# Patient Record
Sex: Male | Born: 1945 | Race: White | Hispanic: No | Marital: Married | Smoking: Former smoker
Health system: Southern US, Community
[De-identification: ages and names within clinical notes are randomized; demographics above are authoritative.]

## PROBLEM LIST (undated history)

## (undated) ENCOUNTER — Ambulatory Visit

## (undated) DIAGNOSIS — T7840XA Allergy, unspecified, initial encounter: Secondary | ICD-10-CM

## (undated) DIAGNOSIS — C9110 Chronic lymphocytic leukemia of B-cell type not having achieved remission: Secondary | ICD-10-CM

## (undated) DIAGNOSIS — N400 Enlarged prostate without lower urinary tract symptoms: Secondary | ICD-10-CM

## (undated) DIAGNOSIS — H269 Unspecified cataract: Secondary | ICD-10-CM

## (undated) HISTORY — DX: Benign prostatic hyperplasia without lower urinary tract symptoms: N40.0

## (undated) HISTORY — DX: Allergy, unspecified, initial encounter: T78.40XA

## (undated) HISTORY — PX: COLONOSCOPY: SHX5424

## (undated) HISTORY — DX: Chronic lymphocytic leukemia of B-cell type not having achieved remission: C91.10

---

## 1994-01-26 HISTORY — PX: HERNIA REPAIR: SHX51

## 2003-01-27 DIAGNOSIS — C9110 Chronic lymphocytic leukemia of B-cell type not having achieved remission: Secondary | ICD-10-CM

## 2003-01-27 HISTORY — DX: Chronic lymphocytic leukemia of B-cell type not having achieved remission: C91.10

## 2003-04-18 ENCOUNTER — Other Ambulatory Visit: Admission: RE | Admit: 2003-04-18 | Discharge: 2003-04-18 | Payer: Self-pay | Admitting: Oncology

## 2003-09-05 ENCOUNTER — Other Ambulatory Visit: Admission: RE | Admit: 2003-09-05 | Discharge: 2003-09-05 | Payer: Self-pay | Admitting: Oncology

## 2004-02-26 ENCOUNTER — Ambulatory Visit: Payer: Self-pay | Admitting: Oncology

## 2004-08-26 ENCOUNTER — Ambulatory Visit: Payer: Self-pay | Admitting: Oncology

## 2005-02-26 ENCOUNTER — Ambulatory Visit: Payer: Self-pay | Admitting: Oncology

## 2005-08-26 ENCOUNTER — Ambulatory Visit: Payer: Self-pay | Admitting: Oncology

## 2005-08-28 LAB — CBC WITH DIFFERENTIAL/PLATELET
EOS%: 0.4 % (ref 0.0–7.0)
Eosinophils Absolute: 0.2 10*3/uL (ref 0.0–0.5)
LYMPH%: 84 % — ABNORMAL HIGH (ref 14.0–48.0)
MCH: 29.2 pg (ref 28.0–33.4)
MCV: 88.7 fL (ref 81.6–98.0)
MONO%: 4.1 % (ref 0.0–13.0)
NEUT#: 4.4 10*3/uL (ref 1.5–6.5)
Platelets: 221 10*3/uL (ref 145–400)
RBC: 4.49 10*6/uL (ref 4.20–5.71)

## 2005-08-28 LAB — ERYTHROCYTE SEDIMENTATION RATE: Sed Rate: 6 mm/hr (ref 0–20)

## 2005-08-28 LAB — MORPHOLOGY

## 2005-08-28 LAB — CHCC SMEAR

## 2005-08-31 LAB — IMMUNOFIXATION ELECTROPHORESIS
IgA: 84 mg/dL (ref 68–378)
IgG (Immunoglobin G), Serum: 616 mg/dL — ABNORMAL LOW (ref 694–1618)

## 2005-08-31 LAB — COMPREHENSIVE METABOLIC PANEL
Albumin: 4.3 g/dL (ref 3.5–5.2)
BUN: 13 mg/dL (ref 6–23)
Calcium: 9.3 mg/dL (ref 8.4–10.5)
Chloride: 103 mEq/L (ref 96–112)
Glucose, Bld: 122 mg/dL — ABNORMAL HIGH (ref 70–99)
Potassium: 4 mEq/L (ref 3.5–5.3)

## 2006-02-23 ENCOUNTER — Ambulatory Visit: Payer: Self-pay | Admitting: Oncology

## 2006-02-26 LAB — CBC & DIFF AND RETIC
Basophils Absolute: 0.7 10*3/uL — ABNORMAL HIGH (ref 0.0–0.1)
EOS%: 0.2 % (ref 0.0–7.0)
HGB: 14.5 g/dL (ref 13.0–17.1)
MCH: 29.7 pg (ref 28.0–33.4)
MCHC: 33.8 g/dL (ref 32.0–35.9)
MCV: 88 fL (ref 81.6–98.0)
MONO%: 4.3 % (ref 0.0–13.0)
NEUT%: 11.7 % — ABNORMAL LOW (ref 40.0–75.0)
RDW: 14.3 % (ref 11.2–14.6)
RETIC #: 34.1 10*3/uL (ref 31.8–103.9)

## 2006-03-03 LAB — COMPREHENSIVE METABOLIC PANEL
Albumin: 4.7 g/dL (ref 3.5–5.2)
BUN: 12 mg/dL (ref 6–23)
Calcium: 9 mg/dL (ref 8.4–10.5)
Chloride: 103 mEq/L (ref 96–112)
Glucose, Bld: 97 mg/dL (ref 70–99)
Potassium: 3.7 mEq/L (ref 3.5–5.3)

## 2006-03-03 LAB — IMMUNOFIXATION ELECTROPHORESIS
IgA: 88 mg/dL (ref 68–378)
IgG (Immunoglobin G), Serum: 743 mg/dL (ref 694–1618)

## 2006-09-03 ENCOUNTER — Ambulatory Visit: Payer: Self-pay | Admitting: Oncology

## 2006-09-07 LAB — MORPHOLOGY: PLT EST: ADEQUATE

## 2006-09-07 LAB — CBC WITH DIFFERENTIAL/PLATELET
Basophils Absolute: 1 10*3/uL — ABNORMAL HIGH (ref 0.0–0.1)
EOS%: 0.4 % (ref 0.0–7.0)
HCT: 39.6 % (ref 38.7–49.9)
HGB: 13.6 g/dL (ref 13.0–17.1)
MCH: 30.2 pg (ref 28.0–33.4)
MCV: 87.5 fL (ref 81.6–98.0)
MONO%: 4 % (ref 0.0–13.0)
NEUT%: 9.6 % — ABNORMAL LOW (ref 40.0–75.0)

## 2006-09-09 LAB — COMPREHENSIVE METABOLIC PANEL
AST: 15 U/L (ref 0–37)
Albumin: 4.5 g/dL (ref 3.5–5.2)
Alkaline Phosphatase: 50 U/L (ref 39–117)
BUN: 12 mg/dL (ref 6–23)
Potassium: 4.2 mEq/L (ref 3.5–5.3)
Sodium: 141 mEq/L (ref 135–145)
Total Bilirubin: 0.7 mg/dL (ref 0.3–1.2)

## 2006-09-09 LAB — PROTEIN ELECTROPHORESIS, SERUM: Gamma Globulin: 9.4 % — ABNORMAL LOW (ref 11.1–18.8)

## 2006-09-09 LAB — BETA 2 MICROGLOBULIN, SERUM: Beta-2 Microglobulin: 1.29 mg/L (ref 1.01–1.73)

## 2007-03-03 ENCOUNTER — Ambulatory Visit: Payer: Self-pay | Admitting: Oncology

## 2007-03-07 LAB — MANUAL DIFFERENTIAL
Basophil: 0 % (ref 0–2)
LYMPH: 92 % — ABNORMAL HIGH (ref 14–49)
MONO: 0 % (ref 0–14)
Metamyelocytes: 0 % (ref 0–0)
Myelocytes: 0 % (ref 0–0)
PLT EST: ADEQUATE

## 2007-03-07 LAB — CBC WITH DIFFERENTIAL/PLATELET
HCT: 41 % (ref 38.7–49.9)
HGB: 13.6 g/dL (ref 13.0–17.1)
MCH: 29.1 pg (ref 28.0–33.4)
MCV: 87.5 fL (ref 81.6–98.0)
Platelets: 214 10*3/uL (ref 145–400)

## 2007-03-09 LAB — PROTEIN ELECTROPHORESIS, SERUM
Alpha-2-Globulin: 10.4 % (ref 7.1–11.8)
Beta 2: 4.4 % (ref 3.2–6.5)
Beta Globulin: 6.2 % (ref 4.7–7.2)
Gamma Globulin: 9.1 % — ABNORMAL LOW (ref 11.1–18.8)
Total Protein, Serum Electrophoresis: 6.9 g/dL (ref 6.0–8.3)

## 2007-03-09 LAB — COMPREHENSIVE METABOLIC PANEL
ALT: 20 U/L (ref 0–53)
AST: 15 U/L (ref 0–37)
Alkaline Phosphatase: 61 U/L (ref 39–117)
Potassium: 3.7 mEq/L (ref 3.5–5.3)
Sodium: 143 mEq/L (ref 135–145)
Total Bilirubin: 0.5 mg/dL (ref 0.3–1.2)
Total Protein: 6.9 g/dL (ref 6.0–8.3)

## 2007-11-08 ENCOUNTER — Ambulatory Visit: Payer: Self-pay | Admitting: Oncology

## 2007-11-10 LAB — CBC & DIFF AND RETIC
BASO%: 0.6 % (ref 0.0–2.0)
Basophils Absolute: 0.3 10*3/uL — ABNORMAL HIGH (ref 0.0–0.1)
HCT: 41 % (ref 38.7–49.9)
HGB: 13.6 g/dL (ref 13.0–17.1)
IRF: 0.28 (ref 0.070–0.380)
MONO#: 0.1 10*3/uL (ref 0.1–0.9)
NEUT%: 23 % — ABNORMAL LOW (ref 40.0–75.0)
WBC: 49.7 10*3/uL — ABNORMAL HIGH (ref 4.0–10.0)
lymph#: 37.7 10*3/uL — ABNORMAL HIGH (ref 0.9–3.3)

## 2007-11-10 LAB — MORPHOLOGY: RBC Comments: NORMAL

## 2007-11-10 LAB — ERYTHROCYTE SEDIMENTATION RATE: Sed Rate: 2 mm/hr (ref 0–20)

## 2007-11-14 LAB — SPEP & IFE WITH QIG
Albumin ELP: 66.5 % — ABNORMAL HIGH (ref 55.8–66.1)
Alpha-2-Globulin: 10 % (ref 7.1–11.8)
Beta 2: 4.1 % (ref 3.2–6.5)
Beta Globulin: 6.5 % (ref 4.7–7.2)
Total Protein, Serum Electrophoresis: 6.7 g/dL (ref 6.0–8.3)

## 2007-11-14 LAB — COMPREHENSIVE METABOLIC PANEL
AST: 13 U/L (ref 0–37)
Albumin: 4.6 g/dL (ref 3.5–5.2)
Alkaline Phosphatase: 55 U/L (ref 39–117)
Calcium: 9.3 mg/dL (ref 8.4–10.5)
Chloride: 105 mEq/L (ref 96–112)
Glucose, Bld: 94 mg/dL (ref 70–99)
Potassium: 4.3 mEq/L (ref 3.5–5.3)
Sodium: 143 mEq/L (ref 135–145)
Total Protein: 6.7 g/dL (ref 6.0–8.3)

## 2008-05-15 ENCOUNTER — Ambulatory Visit: Payer: Self-pay | Admitting: Oncology

## 2008-05-17 LAB — MORPHOLOGY: PLT EST: ADEQUATE

## 2008-05-17 LAB — CBC WITH DIFFERENTIAL/PLATELET
Basophils Absolute: 0.2 10*3/uL — ABNORMAL HIGH (ref 0.0–0.1)
EOS%: 0.5 % (ref 0.0–7.0)
Eosinophils Absolute: 0.2 10*3/uL (ref 0.0–0.5)
HGB: 13.8 g/dL (ref 13.0–17.1)
LYMPH%: 83.4 % — ABNORMAL HIGH (ref 14.0–49.0)
MCH: 29.4 pg (ref 27.2–33.4)
MCV: 88.2 fL (ref 79.3–98.0)
MONO%: 1.9 % (ref 0.0–14.0)
Platelets: 176 10*3/uL (ref 140–400)
RBC: 4.68 10*6/uL (ref 4.20–5.82)
RDW: 14.3 % (ref 11.0–14.6)

## 2008-05-17 LAB — CHCC SMEAR

## 2008-11-07 ENCOUNTER — Ambulatory Visit: Payer: Self-pay | Admitting: Oncology

## 2008-11-09 LAB — CBC & DIFF AND RETIC
BASO%: 0.2 % (ref 0.0–2.0)
EOS%: 0.3 % (ref 0.0–7.0)
HCT: 42.5 % (ref 38.4–49.9)
LYMPH%: 89.2 % — ABNORMAL HIGH (ref 14.0–49.0)
MCH: 28.9 pg (ref 27.2–33.4)
MCHC: 32 g/dL (ref 32.0–36.0)
MCV: 90.4 fL (ref 79.3–98.0)
MONO%: 1.9 % (ref 0.0–14.0)
NEUT%: 8.4 % — ABNORMAL LOW (ref 39.0–75.0)
Platelets: 195 10*3/uL (ref 140–400)

## 2008-11-09 LAB — MORPHOLOGY

## 2008-11-09 LAB — CHCC SMEAR

## 2008-11-14 LAB — COMPREHENSIVE METABOLIC PANEL
CO2: 27 mEq/L (ref 19–32)
Calcium: 9 mg/dL (ref 8.4–10.5)
Chloride: 105 mEq/L (ref 96–112)
Creatinine, Ser: 1.11 mg/dL (ref 0.40–1.50)
Glucose, Bld: 91 mg/dL (ref 70–99)
Total Bilirubin: 0.4 mg/dL (ref 0.3–1.2)

## 2008-11-14 LAB — IMMUNOFIXATION ELECTROPHORESIS
IgA: 71 mg/dL (ref 68–378)
IgM, Serum: 33 mg/dL — ABNORMAL LOW (ref 60–263)
Total Protein, Serum Electrophoresis: 6.3 g/dL (ref 6.0–8.3)

## 2008-11-14 LAB — LACTATE DEHYDROGENASE: LDH: 131 U/L (ref 94–250)

## 2009-11-12 ENCOUNTER — Ambulatory Visit: Payer: Self-pay | Admitting: Oncology

## 2009-11-14 LAB — COMPREHENSIVE METABOLIC PANEL
Albumin: 4.5 g/dL (ref 3.5–5.2)
CO2: 26 mEq/L (ref 19–32)
Calcium: 9.1 mg/dL (ref 8.4–10.5)
Chloride: 105 mEq/L (ref 96–112)
Creatinine, Ser: 1.2 mg/dL (ref 0.40–1.50)
Glucose, Bld: 121 mg/dL — ABNORMAL HIGH (ref 70–99)
Total Bilirubin: 0.5 mg/dL (ref 0.3–1.2)
Total Protein: 6.5 g/dL (ref 6.0–8.3)

## 2009-11-14 LAB — CBC & DIFF AND RETIC
Basophils Absolute: 0.1 10*3/uL (ref 0.0–0.1)
EOS%: 0.3 % (ref 0.0–7.0)
Eosinophils Absolute: 0.2 10*3/uL (ref 0.0–0.5)
HGB: 13.6 g/dL (ref 13.0–17.1)
LYMPH%: 88 % — ABNORMAL HIGH (ref 14.0–49.0)
MCH: 29.2 pg (ref 27.2–33.4)
MCHC: 32.6 g/dL (ref 32.0–36.0)
MCV: 89.7 fL (ref 79.3–98.0)
NEUT%: 9.6 % — ABNORMAL LOW (ref 39.0–75.0)
Platelets: 177 10*3/uL (ref 140–400)
RBC: 4.65 10*6/uL (ref 4.20–5.82)
RDW: 14.1 % (ref 11.0–14.6)
WBC: 46.6 10*3/uL — ABNORMAL HIGH (ref 4.0–10.3)
lymph#: 41 10*3/uL — ABNORMAL HIGH (ref 0.9–3.3)

## 2010-02-16 ENCOUNTER — Encounter: Payer: Self-pay | Admitting: Orthopedic Surgery

## 2010-09-25 ENCOUNTER — Other Ambulatory Visit: Payer: Self-pay | Admitting: Surgery

## 2010-11-14 ENCOUNTER — Other Ambulatory Visit: Payer: Self-pay | Admitting: Oncology

## 2010-11-14 ENCOUNTER — Encounter (HOSPITAL_BASED_OUTPATIENT_CLINIC_OR_DEPARTMENT_OTHER): Payer: Medicare Other | Admitting: Oncology

## 2010-11-14 DIAGNOSIS — C9110 Chronic lymphocytic leukemia of B-cell type not having achieved remission: Secondary | ICD-10-CM

## 2010-11-14 LAB — CBC WITH DIFFERENTIAL/PLATELET
BASO%: 1.3 % (ref 0.0–2.0)
MCHC: 32.4 g/dL (ref 32.0–36.0)
MONO#: 0.6 10*3/uL (ref 0.1–0.9)
RBC: 4.71 10*6/uL (ref 4.20–5.82)
WBC: 47.4 10*3/uL — ABNORMAL HIGH (ref 4.0–10.3)
lymph#: 40.4 10*3/uL — ABNORMAL HIGH (ref 0.9–3.3)

## 2010-11-14 LAB — CHCC SMEAR

## 2010-11-14 LAB — MORPHOLOGY

## 2010-11-17 LAB — COMPREHENSIVE METABOLIC PANEL
Albumin: 4.5 g/dL (ref 3.5–5.2)
BUN: 12 mg/dL (ref 6–23)
CO2: 23 mEq/L (ref 19–32)
Calcium: 9.2 mg/dL (ref 8.4–10.5)
Chloride: 105 mEq/L (ref 96–112)
Glucose, Bld: 145 mg/dL — ABNORMAL HIGH (ref 70–99)
Potassium: 4 mEq/L (ref 3.5–5.3)

## 2010-11-17 LAB — BETA 2 MICROGLOBULIN, SERUM: Beta-2 Microglobulin: 2.04 mg/L — ABNORMAL HIGH (ref 1.01–1.73)

## 2010-11-21 ENCOUNTER — Encounter (HOSPITAL_BASED_OUTPATIENT_CLINIC_OR_DEPARTMENT_OTHER): Payer: Medicare Other | Admitting: Oncology

## 2010-11-21 DIAGNOSIS — C9110 Chronic lymphocytic leukemia of B-cell type not having achieved remission: Secondary | ICD-10-CM

## 2011-03-24 ENCOUNTER — Ambulatory Visit (INDEPENDENT_AMBULATORY_CARE_PROVIDER_SITE_OTHER): Payer: Medicare Other | Admitting: Family Medicine

## 2011-03-24 VITALS — BP 134/71 | HR 74 | Temp 98.3°F | Resp 20 | Ht 67.0 in | Wt 169.0 lb

## 2011-03-24 DIAGNOSIS — L723 Sebaceous cyst: Secondary | ICD-10-CM

## 2011-03-24 DIAGNOSIS — C9110 Chronic lymphocytic leukemia of B-cell type not having achieved remission: Secondary | ICD-10-CM | POA: Insufficient documentation

## 2011-03-24 DIAGNOSIS — L720 Epidermal cyst: Secondary | ICD-10-CM

## 2011-03-24 DIAGNOSIS — J40 Bronchitis, not specified as acute or chronic: Secondary | ICD-10-CM

## 2011-03-24 MED ORDER — AZITHROMYCIN 250 MG PO TABS: ORAL_TABLET | ORAL | Status: AC

## 2011-03-24 NOTE — Progress Notes (Signed)
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  No fever.  Had flu shot.  Also c/o lump in groin. Noted right perineum in shower.  Nontender   O:  NAD HEENT:  Negative Chest:  Clear Heart reg Skin:  Intradermal nodule right perineum  A:  Intradermal cyst, benign Bronchitis in context of CLL  P:  Z-pak, mucinex

## 2011-05-14 ENCOUNTER — Ambulatory Visit (INDEPENDENT_AMBULATORY_CARE_PROVIDER_SITE_OTHER): Payer: Medicare Other | Admitting: Family Medicine

## 2011-05-14 ENCOUNTER — Encounter: Payer: Self-pay | Admitting: Family Medicine

## 2011-05-14 VITALS — BP 125/72 | HR 68 | Temp 97.1°F | Resp 16 | Ht 67.0 in | Wt 167.0 lb

## 2011-05-14 DIAGNOSIS — E882 Lipomatosis, not elsewhere classified: Secondary | ICD-10-CM | POA: Insufficient documentation

## 2011-05-14 DIAGNOSIS — Z0000 Encounter for general adult medical examination without abnormal findings: Secondary | ICD-10-CM

## 2011-05-14 LAB — IFOBT (OCCULT BLOOD): IFOBT: NEGATIVE

## 2011-05-14 LAB — POCT URINALYSIS DIPSTICK
Bilirubin, UA: NEGATIVE
Glucose, UA: NEGATIVE
Ketones, UA: NEGATIVE
Leukocytes, UA: NEGATIVE
Nitrite, UA: NEGATIVE
Protein, UA: NEGATIVE
Spec Grav, UA: 1.02
Urobilinogen, UA: 0.2
pH, UA: 7.5

## 2011-05-14 LAB — POCT UA - MICROSCOPIC ONLY
Bacteria, U Microscopic: NEGATIVE
Casts, Ur, LPF, POC: NEGATIVE
Crystals, Ur, HPF, POC: NEGATIVE
Mucus, UA: NEGATIVE
WBC, Ur, HPF, POC: NEGATIVE
Yeast, UA: NEGATIVE

## 2011-05-14 NOTE — Patient Instructions (Signed)
Advance Directives (My Voice, My Choice) Advance directives are a means for you to make choices about your health care. It is a way that you may accept or refuse medical treatment if you cannot speak for yourself. An advance directive gives you a way to express your wishes about treatment choices in the event that you cannot speak for yourself. These directives protect your right to make your own health care choices. Some examples of advance directives would be:  A living will is a prepared document that designates your wishes in the event of a serious illness when you cannot care for yourself.   A patient advocate designation for health care means you choose someone who knows your wishes and can speak for you, on your behalf, should you not be able to do so yourself. This is often a close friend or family member.   Think about what is important for you in your life. To what extent do you want machines to keep you alive? How much pain are you willing to accept?   Decide what types of life-sustaining treatments you would or would not want.   Name a person to be your advocate who understands all your wishes and is willing and able to carry them out.   A durable power of attorney for health care is a formal legal agreement with an attorney or legal representative who will be bound to carry out your wishes in the event you are unable to care for or represent yourself. This should be someone you trust to make important medical decisions for you.   Do Not Resuscitate (DNR) is a request to do nothing in the event that your heart stops. A DNR order is used if you are very ill and not expected to recover. DNR orders are accepted by nearly all caregivers and hospitals.  Most caregiver's offices and hospitals have advance directive forms you can use. You may cancel or change these documents at any time. You must be mentally sound and able to communicate your wishes at the time you fill out these  forms. Regardless of how you let your final wishes be known in the event of a terminal illness, make sure you discuss them with your family and friends. Copies should be given to your caregiver, your hospital, your advocate or attorney, and to significant others. If you travel, you may want to find out what is legal and binding in the states where you will be. Laws vary from state to state. Document Released: 03/23/2001 Document Revised: 01/01/2011 Document Reviewed: 09/20/2007 [employer] Patient Information 2012 [employer].Health Maintenance, Males A healthy lifestyle and preventative care can promote health and wellness.  Maintain regular health, dental, and eye exams.   Eat a healthy diet. Foods like vegetables, fruits, whole grains, low-fat dairy products, and lean protein foods contain the nutrients you need without too many calories. Decrease your intake of foods high in solid fats, added sugars, and salt. Get information about a proper diet from your caregiver, if necessary.   Regular physical exercise is one of the most important things you can do for your health. Most adults should get at least 150 minutes of moderate-intensity exercise (any activity that increases your heart rate and causes you to sweat) each week. In addition, most adults need muscle-strengthening exercises on 2 or more days a week.    Maintain a healthy weight. The body mass index (BMI) is a screening tool to identify possible weight problems. It provides an estimate of   body fat based on height and weight. Your caregiver can help determine your BMI, and can help you achieve or maintain a healthy weight. For adults 20 years and older:   A BMI below 18.5 is considered underweight.   A BMI of 18.5 to 24.9 is normal.   A BMI of 25 to 29.9 is considered overweight.   A BMI of 30 and above is considered obese.   Maintain normal blood lipids and cholesterol by exercising and minimizing your intake of saturated fat. Eat  a balanced diet with plenty of fruits and vegetables. Blood tests for lipids and cholesterol should begin at age 20 and be repeated every 5 years. If your lipid or cholesterol levels are high, you are over 50, or you are a high risk for heart disease, you may need your cholesterol levels checked more frequently.Ongoing high lipid and cholesterol levels should be treated with medicines, if diet and exercise are not effective.   If you smoke, find out from your caregiver how to quit. If you do not use tobacco, do not start.   If you choose to drink alcohol, do not exceed 2 drinks per day. One drink is considered to be 12 ounces (355 mL) of beer, 5 ounces (148 mL) of wine, or 1.5 ounces (44 mL) of liquor.   Avoid use of street drugs. Do not share needles with anyone. Ask for help if you need support or instructions about stopping the use of drugs.   High blood pressure causes heart disease and increases the risk of stroke. Blood pressure should be checked at least every 1 to 2 years. Ongoing high blood pressure should be treated with medicines if weight loss and exercise are not effective.   If you are 45 to 66 years old, ask your caregiver if you should take aspirin to prevent heart disease.   Diabetes screening involves taking a blood sample to check your fasting blood sugar level. This should be done once every 3 years, after age 45, if you are within normal weight and without risk factors for diabetes. Testing should be considered at a younger age or be carried out more frequently if you are overweight and have at least 1 risk factor for diabetes.   Colorectal cancer can be detected and often prevented. Most routine colorectal cancer screening begins at the age of 50 and continues through age 75. However, your caregiver may recommend screening at an earlier age if you have risk factors for colon cancer. On a yearly basis, your caregiver may provide home test kits to check for hidden blood in the  stool. Use of a small camera at the end of a tube, to directly examine the colon (sigmoidoscopy or colonoscopy), can detect the earliest forms of colorectal cancer. Talk to your caregiver about this at age 50, when routine screening begins. Direct examination of the colon should be repeated every 5 to 10 years through age 75, unless early forms of pre-cancerous polyps or small growths are found.   Hepatitis C blood testing is recommended for all people born from 1945 through 1965 and any individual with known risks for hepatitis C.   Healthy men should no longer receive prostate-specific antigen (PSA) blood tests as part of routine cancer screening. Consult with your caregiver about prostate cancer screening.   Testicular cancer screening is not recommended for adolescents or adult males who have no symptoms. Screening includes self-exam, caregiver exam, and other screening tests. Consult with your caregiver about any symptoms   you have or any concerns you have about testicular cancer.   Practice safe sex. Use condoms and avoid high-risk sexual practices to reduce the spread of sexually transmitted infections (STIs).   Use sunscreen with a sun protection factor (SPF) of 30 or greater. Apply sunscreen liberally and repeatedly throughout the day. You should seek shade when your shadow is shorter than you. Protect yourself by wearing long sleeves, pants, a wide-brimmed hat, and sunglasses year round, whenever you are outdoors.   Notify your caregiver of new moles or changes in moles, especially if there is a change in shape or color. Also notify your caregiver if a mole is larger than the size of a pencil eraser.   A one-time screening for abdominal aortic aneurysm (AAA) and surgical repair of large AAAs by sound wave imaging (ultrasonography) is recommended for ages 65 to 75 years who are current or former smokers.   Stay current with your immunizations.  Document Released: 07/11/2007 Document Revised:  01/01/2011 Document Reviewed: 06/09/2010 Exit[employer] Patient Information 2012 [employer]. 

## 2011-05-14 NOTE — Progress Notes (Signed)
66 year old married man comes in for complete physical. He is currently working as a Naval architect on an interim basis. He has no acute problems, but he has been seeing Dr. 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 His white counts have generally been around 50,000. He has 6 grandchildren which she plays with poor exercise.  His other problem is diffuse lipomatosis which is not causing pain but is a subject of interest for his grandchildren.  He gets regular dental care, [facility] for which he's up-to-date.  I suggested he get eye pressure rechecked this year.  Several years ago he was evaluated for heart disease and was told he had a small leak in one of his valves at [facility], but he did not take any medication for this and has not been bothered by dyspnea or chest pain.  Gets regular dental care, last eye eval over 2 years ago, colonoscopy 2009, vaccinations up to date  F/Hx:  Updated ROS:  Neg except as above (reviewed as physical exam completed, system by system)  Objective: No acute distress appears to be healthy, alert and showing good judgment  Skin: No significant lesions, diffuse lipomas on all 4 extremities  Eyes: Normal funduscopic vomit he OM, general appearance  Ears: Normal hearing, moderate cerumen buildup  Oropharynx:: Normal appearance, normal palpation  Neck: Supple, no adenopathy or thyromegaly, no bruits  Chest: Clear to auscultation  Heart: Normal S1-S2, no irregularities, no murmur  Abdomen: Soft nontender without HSM or masses  Genitalia: Normal circumcised male  Rectal exam: Mildly enlarged prostate consistent with age, no nodules, no hemorrhoids  Extremities: Full range of motion, lipomas is noted   Assessment: Patient seems to be in his usual state of health which is maintained despite the initial diagnosis of CLL years ago. He appears to be enjoying his life and functioning at a high level.   Plan: I reviewed the concept of  advanced directives with patient, will proceed with routine labs, and followup as indicated by labs

## 2011-05-15 LAB — CBC
HCT: 46.2 % (ref 39.0–52.0)
Hemoglobin: 14.6 g/dL (ref 13.0–17.0)
MCH: 28.4 pg (ref 26.0–34.0)
MCHC: 31.6 g/dL (ref 30.0–36.0)
MCV: 89.9 fL (ref 78.0–100.0)
Platelets: 237 10*3/uL (ref 150–400)
RBC: 5.14 MIL/uL (ref 4.22–5.81)
RDW: 14.7 % (ref 11.5–15.5)
WBC: 41.6 10*3/uL — ABNORMAL HIGH (ref 4.0–10.5)

## 2011-05-15 LAB — COMPREHENSIVE METABOLIC PANEL
ALT: 24 U/L (ref 0–53)
AST: 22 U/L (ref 0–37)
Albumin: 4.9 g/dL (ref 3.5–5.2)
Alkaline Phosphatase: 69 U/L (ref 39–117)
BUN: 13 mg/dL (ref 6–23)
CO2: 25 mEq/L (ref 19–32)
Calcium: 9.5 mg/dL (ref 8.4–10.5)
Chloride: 104 mEq/L (ref 96–112)
Creat: 1.25 mg/dL (ref 0.50–1.35)
Glucose, Bld: 91 mg/dL (ref 70–99)
Potassium: 4.1 mEq/L (ref 3.5–5.3)
Sodium: 141 mEq/L (ref 135–145)
Total Bilirubin: 0.6 mg/dL (ref 0.3–1.2)
Total Protein: 6.7 g/dL (ref 6.0–8.3)

## 2011-05-15 LAB — LIPID PANEL
Cholesterol: 213 mg/dL — ABNORMAL HIGH (ref 0–200)
HDL: 48 mg/dL (ref >39)
LDL Cholesterol: 122 mg/dL — ABNORMAL HIGH (ref 0–99)
Total CHOL/HDL Ratio: 4.4 Ratio
Triglycerides: 214 mg/dL — ABNORMAL HIGH (ref <150)
VLDL: 43 mg/dL — ABNORMAL HIGH (ref 0–40)

## 2011-05-15 LAB — PSA: PSA: 1.31 ng/mL (ref <=4.00)

## 2011-05-22 ENCOUNTER — Telehealth: Payer: Self-pay | Admitting: Oncology

## 2011-05-22 ENCOUNTER — Other Ambulatory Visit: Payer: Medicare Other | Admitting: Lab

## 2011-05-22 ENCOUNTER — Encounter: Payer: Self-pay | Admitting: *Deleted

## 2011-05-22 ENCOUNTER — Ambulatory Visit: Payer: Medicare Other | Admitting: Oncology

## 2011-05-22 NOTE — Progress Notes (Signed)
Pt reports that he had labs drawn with his PCP last week and have been reviewed by MD. Per MD pt does not need to be seen until Nov. 2013. ONC TX request followed up with phone call to scheduling

## 2011-05-22 NOTE — Telephone Encounter (Signed)
Per victoria appt for today not needed. Pt should f/u in nov. Pt already on schedule for 10/25. Called pt today confirm and s/w wife re appt for 10/25.

## 2011-10-29 ENCOUNTER — Ambulatory Visit (INDEPENDENT_AMBULATORY_CARE_PROVIDER_SITE_OTHER): Payer: Medicare Other | Admitting: Family Medicine

## 2011-10-29 VITALS — BP 126/76 | HR 67 | Temp 98.0°F | Resp 16 | Ht 67.0 in | Wt 167.2 lb

## 2011-10-29 DIAGNOSIS — J069 Acute upper respiratory infection, unspecified: Secondary | ICD-10-CM

## 2011-10-29 DIAGNOSIS — C9110 Chronic lymphocytic leukemia of B-cell type not having achieved remission: Secondary | ICD-10-CM

## 2011-10-29 DIAGNOSIS — J0100 Acute maxillary sinusitis, unspecified: Secondary | ICD-10-CM

## 2011-10-29 MED ORDER — AMOXICILLIN-POT CLAVULANATE 875-125 MG PO TABS: 1.0000 | ORAL_TABLET | Freq: Two times a day (BID) | ORAL | Status: DC

## 2011-10-29 NOTE — Progress Notes (Signed)
[address]   [place], [place]  [address]   [phone]  Subjective:    Patient ID: Tyler Wu, male    DOB: 26-Nov-1945, 66 y.o.   MRN: [id number]  HPIThis 66 y.o. male presents for evaluation of sore throat, sinus congestion.  Onset ten days ago. Onset with sore throat, congestion.  Taking multisystem congestion tablet yet symptoms persistent.  History of CLL diagnosed in 2005; yearly follow-up with oncology; has appointment this month.  +PND; +ear pressure.  No malaise/fatigue.  No fever/chills/sweats.  Mild HA intermittent.  Minimal pressure.  +nasal congestion green and thick.  Minimal rhinorrhea.  No cough.  No SOB.  No v/d.  No daily Claritin at this point; had been taking Claritin without improvement.  Intermittent sneezing; no itchy eyes or nose.    Review of Systems  Constitutional: Negative for fever, chills, diaphoresis and fatigue.  HENT: Positive for congestion, sore throat, sneezing, voice change and postnasal drip. Negative for hearing loss, ear pain, rhinorrhea, trouble swallowing, sinus pressure and ear discharge.   Respiratory: Negative for cough and shortness of breath.   Gastrointestinal: Negative for nausea, vomiting and diarrhea.  Skin: Negative for rash.    No past medical history on file.  Past Surgical History  Procedure Date  . Hernia repair     Prior to Admission medications   Medication Sig Start Date End Date Taking? Authorizing Provider  aspirin 81 MG tablet Take 81 mg by mouth daily.   Yes Historical Provider, MD  loratadine (CLARITIN) 10 MG tablet Take 10 mg by mouth daily.   Yes Historical Provider, MD  amoxicillin-clavulanate (AUGMENTIN) 875-125 MG per tablet Take 1 tablet by mouth 2 (two) times daily. 10/29/11   Ethelda Chick, MD    Allergies  Allergen Reactions  . Levofloxacin Other (See Comments)    Bad dreams     History   Social History  . Marital Status: Married    Spouse Name: N/A    Number of Children: N/A  . Years of  Education: N/A   Occupational History  . Not on file.   Social History Main Topics  . Smoking status: Former Smoker    Types: Cigarettes    Quit date: 01/26/1974  . Smokeless tobacco: Not on file  . Alcohol Use: Not on file  . Drug Use: Not on file  . Sexually Active: Not on file   Other Topics Concern  . Not on file   Social History Narrative  . No narrative on file    Family History  Problem Relation Age of Onset  . Cancer Father        Objective:   Physical Exam  Nursing note and vitals reviewed. Constitutional: He is oriented to person, place, and time. He appears well-developed and well-nourished. No distress.  HENT:  Head: Normocephalic and atraumatic.  Right Ear: External ear normal.  Left Ear: External ear normal.  Nose: Nose normal.  Mouth/Throat: Oropharynx is clear and moist.  Eyes: Conjunctivae normal are normal. Pupils are equal, round, and reactive to light.  Neck: Neck supple.  Cardiovascular: Normal rate, regular rhythm and normal heart sounds.   Pulmonary/Chest: Effort normal and breath sounds normal. No respiratory distress. He has no wheezes. He has no rales.  Lymphadenopathy:    He has no cervical adenopathy.  Neurological: He is alert and oriented to person, place, and time.  Skin: Skin is warm and dry. No rash noted. He is not diaphoretic.  Psychiatric: He  has a normal mood and affect. His behavior is normal.      Assessment & Plan:   1. Sinusitis, acute maxillary   2. URI (upper respiratory infection)   3. CLL  1. Sinusitis Acute Maxillary: New.  Rx for Augmentin provided; pt declined rx for Flonase.  Continue OTC decongestant; pt desires trial of Mucinex D that granddaughter is also using. 2. URI: New.  Etiology to sinusitis.  Supportive care with rest, fluids, decongestant. 3. CLL: onset 2005; followed by oncology yearly; appointment this month.

## 2011-10-29 NOTE — Patient Instructions (Addendum)
1. Sinusitis, acute maxillary   2. URI (upper respiratory infection)    Sinusitis Sinusitis is redness, soreness, and swelling (inflammation) of the paranasal sinuses. Paranasal sinuses are air pockets within the bones of your face (beneath the eyes, the middle of the forehead, or above the eyes). In healthy paranasal sinuses, mucus is able to drain out, and air is able to circulate through them by way of your nose. However, when your paranasal sinuses are inflamed, mucus and air can become trapped. This can allow bacteria and other germs to grow and cause infection. Sinusitis can develop quickly and last only a short time (acute) or continue over a long period (chronic). Sinusitis that lasts for more than 12 weeks is considered chronic.  CAUSES  Causes of sinusitis include:  Allergies.  Structural abnormalities, such as displacement of the cartilage that separates your nostrils (deviated septum), which can decrease the air flow through your nose and sinuses and affect sinus drainage.  Functional abnormalities, such as when the small hairs (cilia) that line your sinuses and help remove mucus do not work properly or are not present. SYMPTOMS  Symptoms of acute and chronic sinusitis are the same. The primary symptoms are pain and pressure around the affected sinuses. Other symptoms include:  Upper toothache.  Earache.  Headache.  Bad breath.  Decreased sense of smell and taste.  A cough, which worsens when you are lying flat.  Fatigue.  Fever.  Thick drainage from your nose, which often is green and may contain pus (purulent).  Swelling and warmth over the affected sinuses. DIAGNOSIS  Your caregiver will perform a physical exam. During the exam, your caregiver may:  Look in your nose for signs of abnormal growths in your nostrils (nasal polyps).  Tap over the affected sinus to check for signs of infection.  View the inside of your sinuses (endoscopy) with a special imaging  device with a light attached (endoscope), which is inserted into your sinuses. If your caregiver suspects that you have chronic sinusitis, one or more of the following tests may be recommended:  Allergy tests.  Nasal culture A sample of mucus is taken from your nose and sent to a lab and screened for bacteria.  Nasal cytology A sample of mucus is taken from your nose and examined by your caregiver to determine if your sinusitis is related to an allergy. TREATMENT  Most cases of acute sinusitis are related to a viral infection and will resolve on their own within 10 days. Sometimes medicines are prescribed to help relieve symptoms (pain medicine, decongestants, nasal steroid sprays, or saline sprays).  However, for sinusitis related to a bacterial infection, your caregiver will prescribe antibiotic medicines. These are medicines that will help kill the bacteria causing the infection.  Rarely, sinusitis is caused by a fungal infection. In theses cases, your caregiver will prescribe antifungal medicine. For some cases of chronic sinusitis, surgery is needed. Generally, these are cases in which sinusitis recurs more than 3 times per year, despite other treatments. HOME CARE INSTRUCTIONS   Drink plenty of water. Water helps thin the mucus so your sinuses can drain more easily.  Use a humidifier.  Inhale steam 3 to 4 times a day (for example, sit in the bathroom with the shower running).  Apply a warm, moist washcloth to your face 3 to 4 times a day, or as directed by your caregiver.  Use saline nasal sprays to help moisten and clean your sinuses.  Take over-the-counter or prescription medicines  for pain, discomfort, or fever only as directed by your caregiver. SEEK IMMEDIATE MEDICAL CARE IF:  You have increasing pain or severe headaches.  You have nausea, vomiting, or drowsiness.  You have swelling around your face.  You have vision problems.  You have a stiff neck.  You have  difficulty breathing. MAKE SURE YOU:   Understand these instructions.  Will watch your condition.  Will get help right away if you are not doing well or get worse. Document Released: 01/12/2005 Document Revised: 04/06/2011 Document Reviewed: 01/27/2011 [facility] Patient Information 2013 [place], [place].

## 2011-10-29 NOTE — Progress Notes (Signed)
Reviewed and agree.

## 2011-11-20 ENCOUNTER — Ambulatory Visit (HOSPITAL_BASED_OUTPATIENT_CLINIC_OR_DEPARTMENT_OTHER): Payer: Medicare Other | Admitting: Oncology

## 2011-11-20 ENCOUNTER — Other Ambulatory Visit (HOSPITAL_BASED_OUTPATIENT_CLINIC_OR_DEPARTMENT_OTHER): Payer: Medicare Other

## 2011-11-20 ENCOUNTER — Telehealth: Payer: Self-pay | Admitting: Oncology

## 2011-11-20 ENCOUNTER — Other Ambulatory Visit: Payer: Self-pay | Admitting: *Deleted

## 2011-11-20 VITALS — BP 153/83 | HR 60 | Temp 97.7°F | Resp 20 | Ht 67.0 in | Wt 169.2 lb

## 2011-11-20 DIAGNOSIS — C9110 Chronic lymphocytic leukemia of B-cell type not having achieved remission: Secondary | ICD-10-CM

## 2011-11-20 LAB — COMPREHENSIVE METABOLIC PANEL (CC13)
Albumin: 4.2 g/dL (ref 3.5–5.0)
Alkaline Phosphatase: 74 U/L (ref 40–150)
BUN: 14 mg/dL (ref 7.0–26.0)
CO2: 26 mEq/L (ref 22–29)
Glucose: 87 mg/dl (ref 70–99)
Potassium: 4.3 mEq/L (ref 3.5–5.1)
Total Bilirubin: 0.6 mg/dL (ref 0.20–1.20)

## 2011-11-20 LAB — CBC WITH DIFFERENTIAL/PLATELET
Basophils Absolute: 0.1 10*3/uL (ref 0.0–0.1)
Eosinophils Absolute: 0.2 10*3/uL (ref 0.0–0.5)
HGB: 14.4 g/dL (ref 13.0–17.1)
LYMPH%: 87.3 % — ABNORMAL HIGH (ref 14.0–49.0)
MCV: 87.5 fL (ref 79.3–98.0)
MONO%: 2 % (ref 0.0–14.0)
NEUT#: 4.8 10*3/uL (ref 1.5–6.5)
Platelets: 178 10*3/uL (ref 140–400)

## 2011-11-20 LAB — LACTATE DEHYDROGENASE (CC13): LDH: 173 U/L (ref 125–220)

## 2011-11-20 LAB — TECHNOLOGIST REVIEW

## 2011-11-20 NOTE — Telephone Encounter (Signed)
gve the pt his oct 2014 appt calendar. °

## 2011-11-20 NOTE — Progress Notes (Signed)
Hematology and Oncology Follow Up Visit  Tyler Wu [id number] 1945-09-02 66 y.o. 11/20/2011 11:10 AM   DIAGNOSIS:  CLL on observation  PAST THERAPY:   none Interim History:  Pt is doing well, he had a recent sinus infection. No other concerns. He was inquiring about the shingles vaccination.  Medications: I have reviewed the patient's current medications.  Allergies:  Allergies  Allergen Reactions  . Levofloxacin Other (See Comments)    Bad dreams     Past Medical History, Surgical history, Social history, and Family History were reviewed and updated.  Review of Systems: Constitutional:  Negative for fever, chills, night sweats, anorexia, weight loss, pain. Cardiovascular: no chest pain or dyspnea on exertion Respiratory: negative Neurological: negative Dermatological: negative ENT: negative Skin Gastrointestinal: no abdominal pain, change in bowel habits, or black or bloody stools Genito-Urinary: negative Hematological and Lymphatic: negative Breast: negative Musculoskeletal: negative Remaining ROS negative.  Physical Exam:  Blood pressure 153/83, pulse 60, temperature 97.7 F (36.5 C), resp. rate 20, height 5\' 7"  (1.702 m), weight 169 lb 3.2 oz (76.749 kg).  ECOG:  0  HEENT:  Sclerae anicteric, conjunctivae pink.  Oropharynx clear.  No mucositis or candidiasis.  Nodes:  No cervical, supraclavicular, or axillary lymphadenopathy palpated.    Lungs:  Clear to auscultation bilaterally.  No crackles, rhonchi, or wheezes.  Heart:  Regular rate and rhythm.  Abdomen:  Soft, nontender.  Positive bowel sounds.  No organomegaly or masses palpated.  Musculoskeletal:  No focal spinal tenderness to palpation.  Extremities:  Benign.  No peripheral edema or cyanosis.  Skin:  Benign.  Neuro:  Nonfocal.  Of note is multiple lipomas on exam  Lab Results: Lab Results  Component Value Date   WBC 48.0* 11/20/2011   HGB 14.4 11/20/2011   HCT 43.4 11/20/2011   MCV 87.5  11/20/2011   PLT 178 11/20/2011     Chemistry      Component Value Date/Time   NA 141 05/14/2011 0936   K 4.1 05/14/2011 0936   CL 104 05/14/2011 0936   CO2 25 05/14/2011 0936   BUN 13 05/14/2011 0936   CREATININE 1.25 05/14/2011 0936   CREATININE 1.24 11/14/2010 1407   CREATININE 1.24 11/14/2010 1407      Component Value Date/Time   CALCIUM 9.5 05/14/2011 0936   ALKPHOS 69 05/14/2011 0936   AST 22 05/14/2011 0936   ALT 24 05/14/2011 0936   BILITOT 0.6 05/14/2011 0936       Radiological Studies:  No results found.   IMPRESSIONS AND PLAN: A 66 y.o. male with   With stage 0 CLL, on observation. We will continue to follow him on an  Annual basis with blood work. Benign lipomatosis  Spent more than half the time coordinating care.     Tyler Wu 10/25/201311:10 AM Cell [id number]

## 2011-11-23 LAB — BETA 2 MICROGLOBULIN, SERUM: Beta-2 Microglobulin: 1.96 mg/L — ABNORMAL HIGH (ref 1.01–1.73)

## 2011-12-02 ENCOUNTER — Ambulatory Visit (INDEPENDENT_AMBULATORY_CARE_PROVIDER_SITE_OTHER): Payer: Medicare Other | Admitting: Family Medicine

## 2011-12-02 VITALS — BP 126/75 | HR 73 | Temp 98.2°F | Resp 17 | Ht 67.0 in | Wt 167.0 lb

## 2011-12-02 DIAGNOSIS — IMO0002 Reserved for concepts with insufficient information to code with codable children: Secondary | ICD-10-CM

## 2011-12-02 DIAGNOSIS — H8110 Benign paroxysmal vertigo, unspecified ear: Secondary | ICD-10-CM

## 2011-12-02 MED ORDER — MECLIZINE HCL 12.5 MG PO TABS: 12.5000 mg | ORAL_TABLET | Freq: Three times a day (TID) | ORAL | Status: DC | PRN

## 2011-12-02 NOTE — Patient Instructions (Signed)
Benign Positional Vertigo  Vertigo means you feel like you or your surroundings are moving when they are not. Benign positional vertigo is the most common form of vertigo. Benign means that the cause of your condition is not serious. Benign positional vertigo is more common in older adults.  CAUSES   Benign positional vertigo is the result of an upset in the labyrinth system. This is an area in the middle ear that helps control your balance. This may be caused by a viral infection, head injury, or repetitive motion. However, often no specific cause is found.  SYMPTOMS   Symptoms of benign positional vertigo occur when you move your head or eyes in different directions. Some of the symptoms may include:  · Loss of balance and falls.  · Vomiting.  · Blurred vision.  · Dizziness.  · Nausea.  · Involuntary eye movements (nystagmus).  DIAGNOSIS   Benign positional vertigo is usually diagnosed by physical exam. If the specific cause of your benign positional vertigo is unknown, your caregiver may perform imaging tests, such as magnetic resonance imaging (MRI) or computed tomography (CT).  TREATMENT   Your caregiver may recommend movements or procedures to correct the benign positional vertigo. Medicines such as meclizine, benzodiazepines, and medicines for nausea may be used to treat your symptoms. In rare cases, if your symptoms are caused by certain conditions that affect the inner ear, you may need surgery.  HOME CARE INSTRUCTIONS   · Follow your caregiver's instructions.  · Move slowly. Do not make sudden body or head movements.  · Avoid driving.  · Avoid operating heavy machinery.  · Avoid performing any tasks that would be dangerous to you or others during a vertigo episode.  · Drink enough fluids to keep your urine clear or pale yellow.  SEEK IMMEDIATE MEDICAL CARE IF:   · You develop problems with walking, weakness, numbness, or using your arms, hands, or legs.  · You have difficulty speaking.  · You develop  severe headaches.  · Your nausea or vomiting continues or gets worse.  · You develop visual changes.  · Your family or friends notice any behavioral changes.  · Your condition gets worse.  · You have a fever.  · You develop a stiff neck or sensitivity to light.  MAKE SURE YOU:   · Understand these instructions.  · Will watch your condition.  · Will get help right away if you are not doing well or get worse.  Document Released: 10/20/2005 Document Revised: 04/06/2011 Document Reviewed: 10/02/2010  ExitCare® Patient Information ©2013 [employer].

## 2011-12-02 NOTE — Progress Notes (Signed)
66 year old married man comes in for dizziness x 3 days. He is currently working as a Naval architect on an interim basis.  He has been seeing Dr. 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 His white counts have generally been around 50,000. He has 6 grandchildren which she plays with poor exercise. Dizziness when he lies down.  No headache or fever.  Recent eye check.  Recent check with Dr. Caron Presume  No headaches, had recent sinus infection treated with amoxicillin.  Objective:  NAD Eyes:  Normal fundi, PERLA Neck:  Supple TM's: dull bilaterally with normal light reflex, no erythema  Lab Results   Component  Value  Date    WBC  48.0*  11/20/2011    HGB  14.4  11/20/2011    HCT  43.4  11/20/2011    MCV  87.5  11/20/2011    PLT  178  11/20/2011    Chemistry       Component  Value  Date/Time  Component  Value  Date/Time    NA  141  05/14/2011 0936   CALCIUM  9.5  05/14/2011 0936    K  4.1  05/14/2011 0936   ALKPHOS  69  05/14/2011 0936    CL  104  05/14/2011 0936   AST  22  05/14/2011 0936    CO2  25  05/14/2011 0936   ALT  24  05/14/2011 0936    BUN  13  05/14/2011 0936   BILITOT  0.6  05/14/2011 0936    CREATININE  1.25  05/14/2011 0936     CREATININE  1.24  11/14/2010 1407     CREATININE  1.24  11/14/2010 1407         Assessment:  BPV 1. Positional vertigo  meclizine (ANTIVERT) 12.5 MG tablet

## 2012-02-11 ENCOUNTER — Ambulatory Visit (INDEPENDENT_AMBULATORY_CARE_PROVIDER_SITE_OTHER): Payer: Medicare Other | Admitting: Family Medicine

## 2012-02-11 VITALS — BP 122/70 | HR 80 | Temp 98.7°F | Resp 18 | Wt 171.0 lb

## 2012-02-11 DIAGNOSIS — J329 Chronic sinusitis, unspecified: Secondary | ICD-10-CM

## 2012-02-11 DIAGNOSIS — J069 Acute upper respiratory infection, unspecified: Secondary | ICD-10-CM

## 2012-02-11 MED ORDER — AZITHROMYCIN 250 MG PO TABS: ORAL_TABLET | ORAL | Status: DC

## 2012-02-11 NOTE — Patient Instructions (Addendum)

## 2012-02-11 NOTE — Progress Notes (Signed)
Subjective:    Patient ID: Tyler Wu, male    DOB: [birth year], 67 y.o.   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  Congestion, sore throat, achiness began 3d ago and has progressed.  No f/c.  Occ prod cough. feels like head is full w/ sinus pressure and ear pain.  Has been using daytime and nighttime tylenol and dayquil.  Work sent him home as they were concerned he was contagious but he does not have any sick days so would like to get back asap - wife just covered from pneumonia - a very prolonged 3 wk course requiring 3 different antibiotics (zpack, levaquin, doxy) and then she dev GI illness.    Did get flu shot this yr. Past Medical History  Diagnosis Date  . CLL (chronic lymphocytic leukemia) 2005    followed by oncology yearly.  . Allergy    Current Outpatient Prescriptions on File Prior to Visit  Medication Sig Dispense Refill  . aspirin 81 MG tablet Take 81 mg by mouth daily.      Marland Kitchen loratadine (CLARITIN) 10 MG tablet Take 10 mg by mouth daily.      . meclizine (ANTIVERT) 12.5 MG tablet Take 1 tablet (12.5 mg total) by mouth 3 (three) times daily as needed.  30 tablet  0  . Multiple Vitamins-Minerals (CENTRUM SILVER PO) Take by mouth daily.       Allergies  Allergen Reactions  . Levofloxacin Other (See Comments)    Bad dreams       Review of Systems  Constitutional: Positive for fatigue. Negative for fever, chills, diaphoresis, activity change, appetite change and unexpected weight change.  HENT: Positive for ear pain, congestion, sore throat, rhinorrhea, postnasal drip and sinus pressure. Negative for mouth sores, neck pain and neck stiffness.   Respiratory: Positive for cough. Negative for shortness of breath.   Cardiovascular: Negative for chest pain.  Gastrointestinal: Negative for nausea, vomiting, abdominal pain, diarrhea and constipation.  Genitourinary: Negative for dysuria.  Musculoskeletal:  Positive for myalgias and arthralgias.  Skin: Negative for rash.  Neurological: Positive for headaches. Negative for syncope.  Hematological: Negative for adenopathy.  Psychiatric/Behavioral: Positive for sleep disturbance.      BP 122/70  Pulse 80  Temp 98.7 F (37.1 C) (Oral)  Resp 18  Wt 171 lb (77.565 kg) Objective:   Physical Exam  Constitutional: He is oriented to person, place, and time. He appears well-developed and well-nourished. No distress.  HENT:  Head: Normocephalic and atraumatic.  Right Ear: Tympanic membrane, external ear and ear canal normal.  Left Ear: Tympanic membrane, external ear and ear canal normal.  Nose: Mucosal edema and rhinorrhea present. Right sinus exhibits maxillary sinus tenderness. Left sinus exhibits maxillary sinus tenderness.  Mouth/Throat: Uvula is midline and mucous membranes are normal. Posterior oropharyngeal erythema present. No oropharyngeal exudate or posterior oropharyngeal edema.  Eyes: Conjunctivae normal are normal. Right eye exhibits no discharge. Left eye exhibits no discharge. No scleral icterus.  Neck: Normal range of motion. Neck supple. No thyromegaly present.  Cardiovascular: Normal rate, regular rhythm, normal heart sounds and intact distal pulses.   Pulmonary/Chest: Effort normal and breath sounds normal. No respiratory distress.  Lymphadenopathy:       Head (right side): No submandibular, no preauricular and no posterior auricular adenopathy present.       Head (left side): No submandibular, no preauricular and no  posterior auricular adenopathy present.    He has no cervical adenopathy.       Right: No supraclavicular adenopathy present.       Left: No supraclavicular adenopathy present.  Neurological: He is alert and oriented to person, place, and time.  Skin: Skin is warm and dry. He is not diaphoretic. No erythema.  Psychiatric: He has a normal mood and affect. His behavior is normal.      Assessment & Plan:  URI - I  suspect this is viral.  Fine to return to work with hand hygiene.  If worsens, gave hard copy of zpack for him to fill.

## 2012-03-22 ENCOUNTER — Telehealth: Payer: Self-pay | Admitting: Oncology

## 2012-03-22 ENCOUNTER — Encounter: Payer: Self-pay | Admitting: Oncology

## 2012-03-22 NOTE — Telephone Encounter (Signed)
left a message giving d/t of appt. asked pt to call back to verf.Marland Kitchentd

## 2012-07-14 ENCOUNTER — Ambulatory Visit (INDEPENDENT_AMBULATORY_CARE_PROVIDER_SITE_OTHER): Payer: Medicare Other | Admitting: Family Medicine

## 2012-07-14 ENCOUNTER — Encounter: Payer: Self-pay | Admitting: Family Medicine

## 2012-07-14 VITALS — BP 120/68 | HR 64 | Temp 98.0°F | Resp 16 | Ht 67.0 in | Wt 166.2 lb

## 2012-07-14 DIAGNOSIS — Z0000 Encounter for general adult medical examination without abnormal findings: Secondary | ICD-10-CM

## 2012-07-14 LAB — COMPREHENSIVE METABOLIC PANEL
ALT: 19 U/L (ref 0–53)
AST: 17 U/L (ref 0–37)
Albumin: 4.8 g/dL (ref 3.5–5.2)
Alkaline Phosphatase: 61 U/L (ref 39–117)
BUN: 17 mg/dL (ref 6–23)
CO2: 26 mEq/L (ref 19–32)
Calcium: 9.2 mg/dL (ref 8.4–10.5)
Chloride: 106 mEq/L (ref 96–112)
Creat: 1.22 mg/dL (ref 0.50–1.35)
Glucose, Bld: 94 mg/dL (ref 70–99)
Potassium: 4.1 mEq/L (ref 3.5–5.3)
Sodium: 141 mEq/L (ref 135–145)
Total Bilirubin: 0.7 mg/dL (ref 0.3–1.2)
Total Protein: 6.6 g/dL (ref 6.0–8.3)

## 2012-07-14 LAB — LIPID PANEL
Cholesterol: 216 mg/dL — ABNORMAL HIGH (ref 0–200)
HDL: 51 mg/dL (ref >39)
LDL Cholesterol: 129 mg/dL — ABNORMAL HIGH (ref 0–99)
Total CHOL/HDL Ratio: 4.2 Ratio
Triglycerides: 181 mg/dL — ABNORMAL HIGH (ref <150)
VLDL: 36 mg/dL (ref 0–40)

## 2012-07-14 LAB — IFOBT (OCCULT BLOOD): IFOBT: NEGATIVE

## 2012-07-14 LAB — POCT URINALYSIS DIPSTICK
Bilirubin, UA: NEGATIVE
Glucose, UA: NEGATIVE
Ketones, UA: NEGATIVE
Leukocytes, UA: NEGATIVE
Nitrite, UA: NEGATIVE
Protein, UA: NEGATIVE
Spec Grav, UA: 1.025
Urobilinogen, UA: 0.2
pH, UA: 7

## 2012-07-14 LAB — PSA: PSA: 1.29 ng/mL (ref <=4.00)

## 2012-07-14 NOTE — Patient Instructions (Signed)
Health Maintenance, Males A healthy lifestyle and preventative care can promote health and wellness.  Maintain regular health, dental, and eye exams.  Eat a healthy diet. Foods like vegetables, fruits, whole grains, low-fat dairy products, and lean protein foods contain the nutrients you need without too many calories. Decrease your intake of foods high in solid fats, added sugars, and salt. Get information about a proper diet from your caregiver, if necessary.  Regular physical exercise is one of the most important things you can do for your health. Most adults should get at least 150 minutes of moderate-intensity exercise (any activity that increases your heart rate and causes you to sweat) each week. In addition, most adults need muscle-strengthening exercises on 2 or more days a week.   Maintain a healthy weight. The body mass index (BMI) is a screening tool to identify possible weight problems. It provides an estimate of body fat based on height and weight. Your caregiver can help determine your BMI, and can help you achieve or maintain a healthy weight. For adults 20 years and older:  A BMI below 18.5 is considered underweight.  A BMI of 18.5 to 24.9 is normal.  A BMI of 25 to 29.9 is considered overweight.  A BMI of 30 and above is considered obese.  Maintain normal blood lipids and cholesterol by exercising and minimizing your intake of saturated fat. Eat a balanced diet with plenty of fruits and vegetables. Blood tests for lipids and cholesterol should begin at age 20 and be repeated every 5 years. If your lipid or cholesterol levels are high, you are over 50, or you are a high risk for heart disease, you may need your cholesterol levels checked more frequently.Ongoing high lipid and cholesterol levels should be treated with medicines, if diet and exercise are not effective.  If you smoke, find out from your caregiver how to quit. If you do not use tobacco, do not start.  If you  choose to drink alcohol, do not exceed 2 drinks per day. One drink is considered to be 12 ounces (355 mL) of beer, 5 ounces (148 mL) of wine, or 1.5 ounces (44 mL) of liquor.  Avoid use of street drugs. Do not share needles with anyone. Ask for help if you need support or instructions about stopping the use of drugs.  High blood pressure causes heart disease and increases the risk of stroke. Blood pressure should be checked at least every 1 to 2 years. Ongoing high blood pressure should be treated with medicines if weight loss and exercise are not effective.  If you are 45 to 67 years old, ask your caregiver if you should take aspirin to prevent heart disease.  Diabetes screening involves taking a blood sample to check your fasting blood sugar level. This should be done once every 3 years, after age 45, if you are within normal weight and without risk factors for diabetes. Testing should be considered at a younger age or be carried out more frequently if you are overweight and have at least 1 risk factor for diabetes.  Colorectal cancer can be detected and often prevented. Most routine colorectal cancer screening begins at the age of 50 and continues through age 75. However, your caregiver may recommend screening at an earlier age if you have risk factors for colon cancer. On a yearly basis, your caregiver may provide home test kits to check for hidden blood in the stool. Use of a small camera at the end of a tube,   to directly examine the colon (sigmoidoscopy or colonoscopy), can detect the earliest forms of colorectal cancer. Talk to your caregiver about this at age 50, when routine screening begins. Direct examination of the colon should be repeated every 5 to 10 years through age 75, unless early forms of pre-cancerous polyps or small growths are found.  Hepatitis C blood testing is recommended for all people born from 1945 through 1965 and any individual with known risks for hepatitis C.  Healthy  men should no longer receive prostate-specific antigen (PSA) blood tests as part of routine cancer screening. Consult with your caregiver about prostate cancer screening.  Testicular cancer screening is not recommended for adolescents or adult males who have no symptoms. Screening includes self-exam, caregiver exam, and other screening tests. Consult with your caregiver about any symptoms you have or any concerns you have about testicular cancer.  Practice safe sex. Use condoms and avoid high-risk sexual practices to reduce the spread of sexually transmitted infections (STIs).  Use sunscreen with a sun protection factor (SPF) of 30 or greater. Apply sunscreen liberally and repeatedly throughout the day. You should seek shade when your shadow is shorter than you. Protect yourself by wearing long sleeves, pants, a wide-brimmed hat, and sunglasses year round, whenever you are outdoors.  Notify your caregiver of new moles or changes in moles, especially if there is a change in shape or color. Also notify your caregiver if a mole is larger than the size of a pencil eraser.  A one-time screening for abdominal aortic aneurysm (AAA) and surgical repair of large AAAs by sound wave imaging (ultrasonography) is recommended for ages 65 to 75 years who are current or former smokers.  Stay current with your immunizations. Document Released: 07/11/2007 Document Revised: 04/06/2011 Document Reviewed: 06/09/2010 Exit[employer] Patient Information 2014 [employer].  

## 2012-07-14 NOTE — Progress Notes (Signed)
Patient ID: Tyler Wu MRN: [id number], DOB: [birth year] 67 y.o. Date of Encounter: 07/14/2012, 8:21 AM  Primary Physician: No primary provider on file.  Chief Complaint: Physical (CPE)  67 y.o. y/o male with history noted below here for CPE.  Doing well. No issues/complaints. 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  Plans to keep on working indefinitely.  Limited vacations because he has been hired as a temp. Married, activities include time with teenage grandchildren and yard work Eating vegetables and fruits in good quantities. EKG done 2009 Colonoscopy without polyps 2009 Routine eye care (wears corrective lenses) Pneumovax last January No symptoms of prostatism.  Last PSA a year ago.  Review of Systems: Consitutional: No fever, chills, fatigue, night sweats, lymphadenopathy, or weight changes. Eyes: No visual changes, eye redness, or discharge. ENT/Mouth: Ears: No otalgia, tinnitus, hearing loss, discharge. Nose: No congestion, rhinorrhea, sinus pain, or epistaxis. Throat: No sore throat, post nasal drip, or teeth pain. Cardiovascular: No CP, palpitations, diaphoresis, DOE, edema, orthopnea, PND. Respiratory: No cough, hemoptysis, SOB, or wheezing. Gastrointestinal: No anorexia, dysphagia, reflux, pain, nausea, vomiting, hematemesis, diarrhea, constipation, BRBPR, or melena. Genitourinary: No dysuria, frequency, urgency, hematuria, incontinence, nocturia, decreased urinary stream, discharge, impotence, or testicular pain/masses. Musculoskeletal: No decreased ROM, myalgias, stiffness, joint swelling, or weakness. Skin: No rash, erythema, lesion changes, pain, warmth, jaundice, or pruritis. Neurological: No headache, dizziness, syncope, seizures, tremors, memory loss, coordination problems, or paresthesias. Psychological: No anxiety, depression, hallucinations, SI/HI. Endocrine: No fatigue, polydipsia, polyphagia, polyuria, or known diabetes. All other  systems were reviewed and are otherwise negative.  Past Medical History  Diagnosis Date  . CLL (chronic lymphocytic leukemia) 2005    followed by oncology yearly.  . Allergy      Past Surgical History  Procedure Laterality Date  . Hernia repair      Home Meds:  Prior to Admission medications   Medication Sig Start Date End Date Taking? Authorizing Provider  aspirin 81 MG tablet Take 81 mg by mouth daily.   Yes Historical Provider, MD  loratadine (CLARITIN) 10 MG tablet Take 10 mg by mouth daily.   Yes Historical Provider, MD  meclizine (ANTIVERT) 12.5 MG tablet Take 1 tablet (12.5 mg total) by mouth 3 (three) times daily as needed. 12/02/11  Yes Elvina Sidle, MD  azithromycin (ZITHROMAX) 250 MG tablet Take 2 tabs PO x 1 dose, then 1 tab PO QD x 4 days 02/11/12   Sherren Mocha, MD  Multiple Vitamins-Minerals (CENTRUM SILVER PO) Take by mouth daily.    Historical Provider, MD    Allergies:  Allergies  Allergen Reactions  . Levofloxacin Other (See Comments)    Bad dreams     History   Social History  . Marital Status: Married    Spouse Name: N/A    Number of Children: N/A  . Years of Education: N/A   Occupational History  . Not on file.   Social History Main Topics  . Smoking status: Former Smoker    Types: Cigarettes    Quit date: 01/26/1974  . Smokeless tobacco: Not on file  . Alcohol Use: No  . Drug Use: No  . Sexually Active: Yes    Birth Control/ Protection: None   Other Topics Concern  . Not on file   Social History Narrative  . No narrative on file    Family History  Problem Relation Age of Onset  . Cancer Father 54    melonoma  . Heart  attack Maternal Grandmother   . Hypertension Maternal Grandmother   . Heart attack Maternal Grandfather   . Hypertension Maternal Grandfather     Physical Exam: Blood pressure 120/68, pulse 64, temperature 98 F (36.7 C), temperature source Oral, resp. rate 16, height 5\' 7"  (1.702 m), weight 166 lb 3.2 oz  (75.388 kg), SpO2 95.00%.  General: Well developed, well nourished, in no acute distress. HEENT: Normocephalic, atraumatic. Conjunctiva pink, sclera non-icteric. Pupils 2 mm constricting to 1 mm, round, regular, and equally reactive to light and accomodation. EOMI. Internal auditory canal clear. TMs with good cone of light and without pathology. Nasal mucosa pink. Nares are without discharge. No sinus tenderness. Oral mucosa pink. Dentition good. Pharynx without exudate.   Neck: Supple. Trachea midline. No thyromegaly. Full ROM. No lymphadenopathy. Lungs: Clear to auscultation bilaterally without wheezes, rales, or rhonchi. Breathing is of normal effort and unlabored. Cardiovascular: RRR with S1 S2. No murmurs, rubs, or gallops appreciated. Distal pulses 2+ symmetrically. No carotid or abdominal bruits Abdomen: Soft, non-tender, non-distended with normoactive bowel sounds. No hepatosplenomegaly or masses. No rebound/guarding. No CVA tenderness. Without hernias.  Rectal: No external hemorrhoids or fissures. Rectal vault without masses.  Genitourinary:  circumcised male. No penile lesions. Testes descended bilaterally, and smooth without tenderness or masses.  Musculoskeletal: Full range of motion and 5/5 strength throughout. Without swelling, atrophy, tenderness, crepitus, or warmth. Extremities without clubbing, cyanosis, or edema. Calves supple. Skin: Warm and moist without erythema, ecchymosis, wounds, or rash. Neuro: A+Ox3. CN II-XII grossly intact. Moves all extremities spontaneously. Full sensation throughout. Normal gait. DTR 2+ throughout upper and lower extremities. Finger to nose intact. Psych:  Responds to questions appropriately with a normal affect.    Results for orders placed in visit on 07/14/12  POCT URINALYSIS DIPSTICK      Result Value Range   Color, UA yellow     Clarity, UA clear     Glucose, UA neg     Bilirubin, UA neg     Ketones, UA neg     Spec Grav, UA 1.025      Blood, UA trace     pH, UA 7.0     Protein, UA neg     Urobilinogen, UA 0.2     Nitrite, UA neg     Leukocytes, UA Negative    IFOBT (OCCULT BLOOD)      Result Value Range   IFOBT Negative       Assessment/Plan:  67 y.o. y/o  male here for CPE Routine general medical examination at a health care facility - Plan: CBC with Differential, Lipid panel, PSA, POCT urinalysis dipstick, IFOBT POC (occult bld, rslt in office), Comprehensive metabolic panel  Written prescription for Zostavax  Signed, Elvina Sidle, MD 07/14/2012 8:21 AM

## 2012-07-15 ENCOUNTER — Telehealth: Payer: Self-pay

## 2012-07-15 DIAGNOSIS — D72829 Elevated white blood cell count, unspecified: Secondary | ICD-10-CM

## 2012-07-15 LAB — CBC WITH DIFFERENTIAL/PLATELET

## 2012-07-15 NOTE — Telephone Encounter (Signed)
Pt notified. Will RTC for blood draw only, future order place.

## 2012-07-15 NOTE — Telephone Encounter (Signed)
Please have patient return for CBc

## 2012-07-15 NOTE — Telephone Encounter (Signed)
LMOM to CB. 

## 2012-07-15 NOTE — Telephone Encounter (Signed)
Dr. Milus Glazier, the CBC was not ran on this pt due to it clotting. I think you were looking at an old result. Do you want [place] to have pt RTC at no charge for a redraw.

## 2012-07-15 NOTE — Addendum Note (Signed)
Addended by: Johnnette Litter on: 07/15/2012 02:36 PM   Modules accepted: Orders

## 2012-07-15 NOTE — Telephone Encounter (Signed)
Message copied by Johnnette Litter on Fri Jul 15, 2012 10:02 AM ------      Message from: Elvina Sidle      Created: Thu Jul 14, 2012  8:48 PM       Patient has abnormal lab values.  The white count is similar to previous counts and consistent with stable chronic lymphocytic anemia.  Other tests are all normal. ------

## 2012-11-22 ENCOUNTER — Ambulatory Visit (HOSPITAL_BASED_OUTPATIENT_CLINIC_OR_DEPARTMENT_OTHER): Payer: Medicare Other | Admitting: Oncology

## 2012-11-22 ENCOUNTER — Ambulatory Visit: Payer: Medicare Other | Admitting: Oncology

## 2012-11-22 ENCOUNTER — Encounter (INDEPENDENT_AMBULATORY_CARE_PROVIDER_SITE_OTHER): Payer: Self-pay

## 2012-11-22 ENCOUNTER — Telehealth: Payer: Self-pay | Admitting: Oncology

## 2012-11-22 ENCOUNTER — Other Ambulatory Visit: Payer: Medicare Other | Admitting: Lab

## 2012-11-22 ENCOUNTER — Ambulatory Visit (HOSPITAL_BASED_OUTPATIENT_CLINIC_OR_DEPARTMENT_OTHER): Payer: Medicare Other | Admitting: Lab

## 2012-11-22 VITALS — BP 145/78 | HR 81 | Temp 97.1°F | Resp 18 | Ht 67.0 in | Wt 168.2 lb

## 2012-11-22 DIAGNOSIS — C9110 Chronic lymphocytic leukemia of B-cell type not having achieved remission: Secondary | ICD-10-CM

## 2012-11-22 LAB — COMPREHENSIVE METABOLIC PANEL (CC13)
ALT: 18 U/L (ref 0–55)
Albumin: 4 g/dL (ref 3.5–5.0)
Anion Gap: 10 mEq/L (ref 3–11)
CO2: 25 mEq/L (ref 22–29)
Calcium: 9.4 mg/dL (ref 8.4–10.4)
Chloride: 106 mEq/L (ref 98–109)
Potassium: 3.9 mEq/L (ref 3.5–5.1)
Sodium: 141 mEq/L (ref 136–145)
Total Bilirubin: 0.47 mg/dL (ref 0.20–1.20)
Total Protein: 6.7 g/dL (ref 6.4–8.3)

## 2012-11-22 LAB — CBC & DIFF AND RETIC
Basophils Absolute: 0.1 10*3/uL (ref 0.0–0.1)
Eosinophils Absolute: 0.2 10*3/uL (ref 0.0–0.5)
LYMPH%: 87.6 % — ABNORMAL HIGH (ref 14.0–49.0)
MCV: 88.3 fL (ref 79.3–98.0)
MONO%: 2.7 % (ref 0.0–14.0)
NEUT#: 4.3 10*3/uL (ref 1.5–6.5)
NEUT%: 9.2 % — ABNORMAL LOW (ref 39.0–75.0)
Platelets: 180 10*3/uL (ref 140–400)
RBC: 4.94 10*6/uL (ref 4.20–5.82)
Retic %: 1.26 % (ref 0.80–1.80)

## 2012-11-22 LAB — MORPHOLOGY
PLT EST: ADEQUATE
RBC Comments: NORMAL

## 2012-11-22 LAB — LACTATE DEHYDROGENASE (CC13): LDH: 155 U/L (ref 125–245)

## 2012-11-22 NOTE — Telephone Encounter (Signed)
Gave pt appt for MD only with Dr. Bertis Ruddy a former Dr. Cyndie Chime patient

## 2012-11-23 NOTE — Progress Notes (Signed)
Hematology and Oncology Follow Up Visit  Tyler Wu [id number] Jan 24, 1946 67 y.o. 11/23/2012 8:44 AM   Principle Diagnosis: Encounter Diagnosis  Name Primary?  . CLL (chronic lymphocytic leukemia) Yes     Interim History:   Annual visit for this 67 year old man who has been in overall excellent health without any major medical or surgical illness until he presented with an episode of  severe pharyngitis back in March of 2005 and was found to have leukocytosis with absolute lymphocytosis. Diagnosis of chronic lymphocytic leukemia, RAI stage 0 was made based on typical CBC findings and peripheral blood flow cytometry. He tells me his white count was about 16,000 at that time. White count has slowly drifted up into the 30-50,000 range with 80% lymphocytes but has been overall stable at this level for at least the last 7 years recorded in the computer since August of 2007. He has not developed anemia or thrombocytopenia. He has not had any history of recurrent or severe infections. He has never had a herpes zoster infection. Total serum immunoglobulins run in the low normal range with IgM consistently low. No monoclonal proteins on IFE. No signs of any paraneoplastic hemolytic anemia with normal reticulocyte count and negative Coombs test today.  Past history negative for hypertension, ulcers, diabetes, asthma, tuberculosis, epidermidis, yellow jaundice, malaria, mononucleosis, kidney stones, thyroid trouble, seizure, stroke, blood clots, or prostate problems. Only prior surgery was a left inguinal hernia repair.  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 He stopped smoking over 30 years ago. He has 2 healthy children a daughter age 33 and a son age 85. He has a stepbrother. No other siblings. His father died of complications of melanoma when he was just a child. Mother just died a few years ago of old age.  He has no major toxic or radiation exposure. 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   Medications: reviewed  Allergies:  Allergies  Allergen Reactions  . Levofloxacin Other (See Comments)    Bad dreams     Review of Systems: Hematology: negative for swollen glands, easy bruising,  ENT ROS: negative for - oral lesions or sore throat Breast ROS: Respiratory ROS: negative for - cough, pleuritic pain, shortness of breath or wheezing Cardiovascular ROS: negative for - chest pain, dyspnea on exertion, edema, irregular heartbeat, murmur, orthopnea, palpitations, paroxysmal nocturnal dyspnea or rapid heart rate Gastrointestinal ROS: negative for - abdominal pain, appetite loss, blood in stools, change in bowel habits, constipation, diarrhea, heartburn, hematemesis, melena, nausea/vomiting or swallowing difficulty/pain Genito-Urinary ROS: negative for -, dysuria, hematuria, incontinence, , nocturia or urinary frequency/urgency Musculoskeletal ROS: negative for - joint pain, joint stiffness, joint swelling, muscle pain, muscular weakness  Neurological ROS: negative for - behavioral changes, confusion, dizziness, gait disturbance, headaches, impaired coordination/balance, memory loss, numbness/tingling, Dermatological ROS: negative for rash, ecchymosis Remaining ROS negative. He plans to get his flu vaccine next week at a local [facility]  Physical Exam: Blood pressure 145/78, pulse 81, temperature 97.1 F (36.2 C), temperature source Oral, resp. rate 18, height 5\' 7"  (1.702 m), weight 168 lb 3.2 oz (76.295 kg), SpO2 98.00%. Wt Readings from Last 3 Encounters:  11/22/12 168 lb 3.2 oz (76.295 kg)  07/14/12 166 lb 3.2 oz (75.388 kg)  02/11/12 171 lb (77.565 kg)     General appearance: Well-nourished Caucasian man HENNT: Pharynx no erythema, exudate, mass,  or ulcer. No thyromegaly or thyroid nodules Lymph nodes: No cervical, supraclavicular  lymphadenopathy. Single, small, 1  cm node palpable in the left axilla. Breasts:  Lungs: Clear to auscultation, resonant to percussion throughout Heart: Regular rhythm, no murmur, no gallop, no rub, no click, no edema Abdomen: Soft, nontender, normal bowel sounds, no mass, no organomegaly Extremities: No edema, no calf tenderness Musculoskeletal: no joint deformities GU: Vascular: Carotid pulses 2+, no bruits, distal pulses: Dorsalis pedis 1+ symmetric Neurologic: Alert, oriented, PERRLA, optic discs sharp and vessels normal, no hemorrhage or exudate, cranial nerves grossly normal, motor strength 5 over 5, reflexes 1+ symmetric, upper body coordination normal, gait normal, sensation intact to vibration over the fingertips. Skin: No rash or ecchymosis  Lab Results: CBC W/Diff  White count differential: 9% neutrophils, 88% lymphocytes, 3% monocytes   Component Value Date/Time   WBC 46.7* 11/22/2012 1550   WBC TEST NOT PERFORMED 07/14/2012 0845   RBC 4.94 11/22/2012 1550   RBC TEST NOT PERFORMED 07/14/2012 0845   HGB 14.0 11/22/2012 1550   HGB TEST NOT PERFORMED 07/14/2012 0845   HCT 43.6 11/22/2012 1550   HCT TEST NOT PERFORMED 07/14/2012 0845   PLT 180 11/22/2012 1550   PLT TEST NOT PERFORMED 07/14/2012 0845   MCV 88.3 11/22/2012 1550   MCV TEST NOT PERFORMED 07/14/2012 0845   MCH 28.3 11/22/2012 1550   MCH TEST NOT PERFORMED 07/14/2012 0845   MCHC 32.1 11/22/2012 1550   MCHC TEST NOT PERFORMED 07/14/2012 0845   RDW 14.5 11/22/2012 1550   RDW TEST NOT PERFORMED 07/14/2012 0845   LYMPHSABS 41.0* 11/22/2012 1550   LYMPHSABS TEST NOT PERFORMED 07/14/2012 0845   MONOABS 1.2* 11/22/2012 1550   MONOABS TEST NOT PERFORMED 07/14/2012 0845   EOSABS 0.2 11/22/2012 1550   EOSABS TEST NOT PERFORMED 07/14/2012 0845   BASOSABS 0.1 11/22/2012 1550   BASOSABS TEST NOT PERFORMED 07/14/2012 0845     Chemistry      Component Value Date/Time   NA 141 11/22/2012 1551   NA 141 07/14/2012 0845   K 3.9 11/22/2012 1551   K 4.1 07/14/2012 0845    CL 106 07/14/2012 0845   CL 107 11/20/2011 1011   CO2 25 11/22/2012 1551   CO2 26 07/14/2012 0845   BUN 12.0 11/22/2012 1551   BUN 17 07/14/2012 0845   CREATININE 1.2 11/22/2012 1551   CREATININE 1.22 07/14/2012 0845   CREATININE 1.24 11/14/2010 1407      Component Value Date/Time   CALCIUM 9.4 11/22/2012 1551   CALCIUM 9.2 07/14/2012 0845   ALKPHOS 67 11/22/2012 1551   ALKPHOS 61 07/14/2012 0845   AST 19 11/22/2012 1551   AST 17 07/14/2012 0845   ALT 18 11/22/2012 1551   ALT 19 07/14/2012 0845   BILITOT 0.47 11/22/2012 1551   BILITOT 0.7 07/14/2012 0845      Impression: Stage 0-1 chronic lymphocytic leukemia (single, small, centimeter size lymph node left axilla on today's exam).  He has very indolent disease with stable counts over the last 9 years since diagnosis. In the absence of anemia, thrombocytopenia, or constitutional symptoms, there is no indication to initiate any treatment.  I reviewed the diagnosis, prognosis, and available treatment options for the patient should he need treatment in the future. We have made rapid advances in the treatment of this leukemia with at least 5 new drugs approved in the last 2 years some of which have quite unique mechanisms, either antibodies to B cell fingerprint proteins or  oral agents that target specific signaling pathways necessary for the malignant cell growth and survival.  He sees his primary care physician on an annual basis we will continue annual followup in this office so that he has counts checked at least twice a year.   CC: Dr. Tomasita Morrow, MD 10/29/20148:44 AM

## 2012-12-31 ENCOUNTER — Ambulatory Visit (INDEPENDENT_AMBULATORY_CARE_PROVIDER_SITE_OTHER): Payer: Medicare Other | Admitting: Family Medicine

## 2012-12-31 VITALS — BP 124/74 | HR 63 | Temp 98.3°F | Resp 18 | Wt 168.0 lb

## 2012-12-31 DIAGNOSIS — J069 Acute upper respiratory infection, unspecified: Secondary | ICD-10-CM

## 2012-12-31 MED ORDER — AZITHROMYCIN 250 MG PO TABS: ORAL_TABLET | ORAL | Status: DC

## 2012-12-31 NOTE — Patient Instructions (Signed)
I recommend frequent warm salt water gargles, hot tea with honey and lemon, rest, and handwashing.  Hot showers or breathing in steam may help loosen the congestion.  Try a netti pot or sinus rinse is also likely to help you feel better and keep this from progressing. Upper Respiratory Infection, Adult An upper respiratory infection (URI) is also known as the common cold. It is often caused by a type of germ (virus). Colds are easily spread (contagious). You can pass it to others by kissing, coughing, sneezing, or drinking out of the same glass. Usually, you get better in 1 or 2 weeks.  HOME CARE    Only take medicine as told by your doctor.  Use a warm mist humidifier or breathe in steam from a hot shower.  Drink enough water and fluids to keep your pee (urine) clear or pale yellow.  Get plenty of rest.  Return to work when your temperature is back to normal or as told by your doctor. You may use a face mask and wash your hands to stop your cold from spreading. GET HELP RIGHT AWAY IF:   After the first few days, you feel you are getting worse.  You have questions about your medicine.  You have chills, shortness of breath, or brown or red spit (mucus).  You have yellow or brown snot (nasal discharge) or pain in the face, especially when you bend forward.  You have a fever, puffy (swollen) neck, pain when you swallow, or white spots in the back of your throat.  You have a bad headache, ear pain, sinus pain, or chest pain.  You have a high-pitched whistling sound when you breathe in and out (wheezing).  You have a lasting cough or cough up blood.  You have sore muscles or a stiff neck. MAKE SURE YOU:   Understand these instructions.  Will watch your condition.  Will get help right away if you are not doing well or get worse. Document Released: 07/01/2007 Document Revised: 04/06/2011 Document Reviewed: 05/19/2010 [employer] Patient Information 2014 [employer].  

## 2012-12-31 NOTE — Progress Notes (Signed)
Subjective:  This chart was scribed for Tyler Sorenson, MD by Tyler Wu, ED Scribe. This patient was seen in room Room/bed 11 and the patient's care was started 1:24 PM.    Patient ID: Tyler Wu, Tyler Wu    DOB: 1945-06-14, 67 y.o.   MRN: [id number]  Chief Complaint  Patient presents with  . Sinusitis    HPI HPI Comments: Tyler Wu is a 67 y.o. Tyler Wu with a h/o chronic lymphocytic leukemia who presents to [facility] complaining of sinusitis that first started 1 week ago. He states he first noticed a sore throat, and later noticed congestion, mild sinus pressure, and a cough consisting of green sputum. He has tried OTC medication with mild relief. He rates his pain and discomfort 2/10. He states he is eating, sleeping, and drinking normally. Pt denies fever, chills, rhinorrhea, jaw pain, otalgia, joint pain, or muscle aches.  Past Medical History  Diagnosis Date  . CLL (chronic lymphocytic leukemia) 2005    followed by oncology yearly.  . Allergy     Allergies  Allergen Reactions  . Levofloxacin Other (See Comments)    Bad dreams     Current Outpatient Prescriptions on File Prior to Visit  Medication Sig Dispense Refill  . aspirin 81 MG tablet Take 81 mg by mouth daily.      Marland Kitchen loratadine (CLARITIN) 10 MG tablet Take 10 mg by mouth daily.       No current facility-administered medications on file prior to visit.     Review of Systems  Constitutional: Negative for fever, chills, activity change and appetite change.  HENT: Positive for congestion, sinus pressure and sore throat. Negative for ear pain and rhinorrhea.   Cardiovascular: Negative for leg swelling.  Musculoskeletal: Negative for arthralgias, joint swelling, myalgias and neck pain.  Skin: Negative for rash.  Neurological: Negative for weakness.  Psychiatric/Behavioral: Negative for sleep disturbance.    BP 124/74  Pulse 63  Temp(Src) 98.3 F (36.8 C) (Oral)  Resp 18  Wt 168 lb (76.204 kg)  SpO2 98%    Objective:   Physical Exam  Nursing note and vitals reviewed. Constitutional: He is oriented to person, place, and time. He appears well-developed and well-nourished.  HENT:  Head: Normocephalic and atraumatic.  Right Ear: Tympanic membrane is retracted.  Left Ear: Tympanic membrane normal.  Mouth/Throat: Posterior oropharyngeal erythema present. No oropharyngeal exudate or posterior oropharyngeal edema.  Eyes: EOM are normal.  Neck: Normal range of motion.  Cardiovascular: Normal rate, regular rhythm, S1 normal, S2 normal, normal heart sounds and intact distal pulses.   Pulmonary/Chest: Effort normal and breath sounds normal.  Musculoskeletal: Normal range of motion.  Lymphadenopathy:       Head (right side): Tonsillar adenopathy present.       Head (left side): Tonsillar adenopathy present.    He has cervical adenopathy.       Right cervical: Superficial cervical adenopathy present. No posterior cervical adenopathy present.      Left cervical: Superficial cervical adenopathy present. No posterior cervical adenopathy present.       Right: No supraclavicular adenopathy present.       Left: No supraclavicular adenopathy present.  Neurological: He is alert and oriented to person, place, and time.  Skin: Skin is warm and dry.  Psychiatric: He has a normal mood and affect. His behavior is normal.    Assessment & Plan:   URI, acute Discussed that highly suspect viral etiology so likely to improve with symptomatic care  alone. However, in case pt develops additional sxs of bacterial infection or worsens, gave snap rx for zpack to fill.  Meds ordered this encounter  Medications  . azithromycin (ZITHROMAX) 250 MG tablet    Sig: Take 2 tabs PO x 1 dose, then 1 tab PO QD x 4 days    Dispense:  6 tablet    Refill:  0    I personally performed the services described in this documentation, which was scribed in my presence. The recorded information has been reviewed and considered, and  addended by me as needed.  Tyler Sorenson, MD MPH

## 2013-11-16 ENCOUNTER — Encounter: Payer: Self-pay | Admitting: *Deleted

## 2013-11-22 ENCOUNTER — Ambulatory Visit (HOSPITAL_BASED_OUTPATIENT_CLINIC_OR_DEPARTMENT_OTHER): Payer: Commercial Managed Care - HMO | Admitting: Hematology and Oncology

## 2013-11-22 ENCOUNTER — Encounter: Payer: Self-pay | Admitting: Hematology and Oncology

## 2013-11-22 ENCOUNTER — Telehealth: Payer: Self-pay | Admitting: Hematology and Oncology

## 2013-11-22 ENCOUNTER — Other Ambulatory Visit: Payer: Self-pay | Admitting: *Deleted

## 2013-11-22 ENCOUNTER — Other Ambulatory Visit: Payer: Self-pay | Admitting: Hematology and Oncology

## 2013-11-22 ENCOUNTER — Ambulatory Visit (HOSPITAL_BASED_OUTPATIENT_CLINIC_OR_DEPARTMENT_OTHER): Payer: Commercial Managed Care - HMO

## 2013-11-22 VITALS — BP 160/73 | HR 84 | Temp 98.4°F | Resp 18 | Ht 67.0 in | Wt 174.9 lb

## 2013-11-22 DIAGNOSIS — R229 Localized swelling, mass and lump, unspecified: Secondary | ICD-10-CM

## 2013-11-22 DIAGNOSIS — C9110 Chronic lymphocytic leukemia of B-cell type not having achieved remission: Secondary | ICD-10-CM

## 2013-11-22 LAB — CBC WITH DIFFERENTIAL/PLATELET
BASO%: 0.1 % (ref 0.0–2.0)
BASOS ABS: 0.1 10*3/uL (ref 0.0–0.1)
EOS ABS: 0.2 10*3/uL (ref 0.0–0.5)
EOS%: 0.3 % (ref 0.0–7.0)
HCT: 44.5 % (ref 38.4–49.9)
HEMOGLOBIN: 14.6 g/dL (ref 13.0–17.1)
LYMPH%: 86.4 % — ABNORMAL HIGH (ref 14.0–49.0)
MCH: 28.9 pg (ref 27.2–33.4)
MCHC: 32.8 g/dL (ref 32.0–36.0)
MCV: 88.1 fL (ref 79.3–98.0)
MONO#: 0.8 10*3/uL (ref 0.1–0.9)
MONO%: 1.9 % (ref 0.0–14.0)
NEUT#: 4.8 10*3/uL (ref 1.5–6.5)
NEUT%: 11.3 % — ABNORMAL LOW (ref 39.0–75.0)
NRBC: 0 % (ref 0–0)
Platelets: 190 10*3/uL (ref 140–400)
RBC: 5.05 10*6/uL (ref 4.20–5.82)
RDW: 14.3 % (ref 11.0–14.6)
WBC: 43.2 10*3/uL — AB (ref 4.0–10.3)
lymph#: 37.3 10*3/uL — ABNORMAL HIGH (ref 0.9–3.3)

## 2013-11-22 LAB — LACTATE DEHYDROGENASE (CC13): LDH: 160 U/L (ref 125–245)

## 2013-11-22 LAB — COMPREHENSIVE METABOLIC PANEL (CC13)
ALBUMIN: 4.1 g/dL (ref 3.5–5.0)
ALK PHOS: 67 U/L (ref 40–150)
ALT: 29 U/L (ref 0–55)
AST: 17 U/L (ref 5–34)
Anion Gap: 10 mEq/L (ref 3–11)
BUN: 11.3 mg/dL (ref 7.0–26.0)
CALCIUM: 9.1 mg/dL (ref 8.4–10.4)
CO2: 26 mEq/L (ref 22–29)
CREATININE: 1.3 mg/dL (ref 0.7–1.3)
Chloride: 106 mEq/L (ref 98–109)
GLUCOSE: 132 mg/dL (ref 70–140)
Potassium: 3.9 mEq/L (ref 3.5–5.1)
Sodium: 142 mEq/L (ref 136–145)
Total Bilirubin: 0.55 mg/dL (ref 0.20–1.20)
Total Protein: 6.4 g/dL (ref 6.4–8.3)

## 2013-11-22 LAB — TECHNOLOGIST REVIEW

## 2013-11-22 NOTE — Assessment & Plan Note (Signed)
The majority of the skin nodules are soft and resemble lipoma except the one in the right forearm is a little bit of a different character. Recommend excisional biopsy to exclude malignant transformation of CLL to lymphoma.

## 2013-11-22 NOTE — Assessment & Plan Note (Signed)
Clinically, his blood work appears stable. I am mildly concerned about the appearance of new skin nodules. Some of the skin nodules resemble lipoma but the one in his right forearm was new and is concerned for possible malignancy. I will refer him to Gen. surgery team for excisional biopsy. If the results turn up benign, I would resume his yearly followup for CLL with history, physical examination and blood work.

## 2013-11-22 NOTE — Telephone Encounter (Signed)
s.w. pt and he will arrive early for labs...pt ok adn aware

## 2013-11-22 NOTE — Progress Notes (Signed)
[place] FOLLOW-UP progress notes  Patient Care Team: Robyn Haber, MD as PCP - General (Family Medicine) Heath Lark, MD as Consulting Physician (Hematology and Oncology)  CHIEF COMPLAINTS/PURPOSE OF VISIT:  CLL  HISTORY OF PRESENTING ILLNESS:  Tyler Wu 68 y.o. male was transferred to my care after his prior physician has left.  I reviewed the patient's records extensive and collaborated the history with the patient. Summary of his history is as follows: He presented with an episode of severe pharyngitis back in March of 2005 and was found to have leukocytosis with absolute lymphocytosis. Diagnosis of chronic lymphocytic leukemia, RAI stage 0 was made based on typical CBC findings and peripheral blood flow cytometry. He initial total his white count was about 16,000 at that time. White count has slowly drifted up into the 30-50,000 range with 80% to 90% lymphocytes but has been overall stable at this level for at least the last 7 years recorded in the computer since August of 2007. He has not developed anemia or thrombocytopenia. He has not had any history of recurrent or severe infections. He has never had a herpes zoster infection. Total serum immunoglobulins run in the low normal range with IgM consistently low. No monoclonal proteins on IFE. No signs of any paraneoplastic hemolytic anemia with normal reticulocyte count and negative Coombs test. His only new complaint today is related to multiple skin nodules. 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 Denies lymphadenopathy.  MEDICAL HISTORY:  Past Medical History  Diagnosis Date  . CLL (chronic lymphocytic leukemia) 2005    followed by oncology yearly.  . Allergy     SURGICAL HISTORY: Past Surgical History  Procedure Laterality Date  . Hernia repair  1996    SOCIAL HISTORY: History   Social History  . Marital Status: Married   Spouse Name: N/A    Number of Children: N/A  . Years of Education: N/A   Occupational History  . purchasing    Social History Main Topics  . Smoking status: Former Smoker    Types: Cigarettes    Quit date: 01/26/1974  . Smokeless tobacco: Never Used  . Alcohol Use: Yes     Comment: rarely 1-2 beers  . Drug Use: No  . Sexual Activity: Yes    Birth Control/ Protection: None     Comment: number of sex partners in the last 63 months  1   Other Topics Concern  . Not on file   Social History Narrative  . No narrative on file    FAMILY HISTORY: Family History  Problem Relation Age of Onset  . Cancer Father 24    melonoma  . Heart attack Maternal Grandmother   . Hypertension Maternal Grandmother   . Heart attack Maternal Grandfather   . Hypertension Maternal Grandfather     ALLERGIES:  is allergic to levofloxacin.  MEDICATIONS:  Current Outpatient Prescriptions  Medication Sig Dispense Refill  . aspirin 81 MG tablet Take 81 mg by mouth daily.      Marland Kitchen loratadine (CLARITIN) 10 MG tablet Take 10 mg by mouth daily.       No current facility-administered medications for this visit.    REVIEW OF SYSTEMS:   Constitutional: Denies fevers, chills or abnormal night sweats Eyes: Denies blurriness of vision, double vision or watery eyes Ears, nose, mouth, throat, and face: Denies mucositis or sore throat Respiratory: Denies cough, dyspnea or wheezes  Cardiovascular: Denies palpitation, chest discomfort or lower extremity swelling Gastrointestinal:  Denies nausea, heartburn or change in bowel habits Skin: Denies abnormal skin rashes Lymphatics: Denies new lymphadenopathy or easy bruising Neurological:Denies numbness, tingling or new weaknesses Behavioral/Psych: Mood is stable, no new changes  All other systems were reviewed with the patient and are negative.  PHYSICAL EXAMINATION: ECOG PERFORMANCE STATUS: 0 - Asymptomatic  Filed Vitals:   11/22/13 1417  BP: 160/73  Pulse:  84  Temp: 98.4 F (36.9 C)  Resp: 18   Filed Weights   11/22/13 1417  Weight: 174 lb 14.4 oz (79.334 kg)    GENERAL:alert, no distress and comfortable SKIN: skin color, texture, turgor are normal, no rashes . Noted multiple skin nodules resemble lipoma except one in the right forearm which is of different character EYES: normal, conjunctiva are pink and non-injected, sclera clear OROPHARYNX:no exudate, normal lips, buccal mucosa, and tongue  NECK: supple, thyroid normal size, non-tender, without nodularity LYMPH:  no palpable lymphadenopathy in the cervical, axillary or inguinal LUNGS: clear to auscultation and percussion with normal breathing effort HEART: regular rate & rhythm and no murmurs without lower extremity edema ABDOMEN:abdomen soft, non-tender and normal bowel sounds Musculoskeletal:no cyanosis of digits and no clubbing  PSYCH: alert & oriented x 3 with fluent speech NEURO: no focal motor/sensory deficits  LABORATORY DATA:  I have reviewed the data as listed Lab Results  Component Value Date   WBC 43.2* 11/22/2013   HGB 14.6 11/22/2013   HCT 44.5 11/22/2013   MCV 88.1 11/22/2013   PLT 190 11/22/2013    Recent Labs  11/22/13 1359  NA 142  K 3.9  CO2 26  GLUCOSE 132  BUN 11.3  CREATININE 1.3  CALCIUM 9.1  PROT 6.4  ALBUMIN 4.1  AST 17  ALT 29  ALKPHOS 67  BILITOT 0.55   ASSESSMENT & PLAN:  CLL (chronic lymphocytic leukemia) Clinically, his blood work appears stable. I am mildly concerned about the appearance of new skin nodules. Some of the skin nodules resemble lipoma but the one in his right forearm was new and is concerned for possible malignancy. I will refer him to Gen. surgery team for excisional biopsy. If the results turn up benign, I would resume his yearly followup for CLL with history, physical examination and blood work.  Multiple skin nodules The majority of the skin nodules are soft and resemble lipoma except the one in the right  forearm is a little bit of a different character. Recommend excisional biopsy to exclude malignant transformation of CLL to lymphoma.    Orders Placed This Encounter  Procedures  . CBC with Differential    Standing Status: Future     Number of Occurrences:      Standing Expiration Date: 12/27/2014  . Comprehensive metabolic panel    Standing Status: Future     Number of Occurrences:      Standing Expiration Date: 12/27/2014  . Lactate dehydrogenase    Standing Status: Future     Number of Occurrences:      Standing Expiration Date: 12/27/2014  . Ambulatory referral to General Surgery    Referral Priority:  Routine    Referral Type:  Surgical    Referral Reason:  Specialty Services Required    Requested Specialty:  General Surgery    Number of Visits Requested:  1    All questions were answered. The patient knows to call the clinic with any problems, questions or concerns. I spent 25 minutes counseling  the patient face to face. The total time spent in the appointment was 30 minutes and more than 50% was on counseling.     [facility], [facility], [facility], [place], MD 11/22/2013 7:34 PM

## 2013-12-08 ENCOUNTER — Other Ambulatory Visit (INDEPENDENT_AMBULATORY_CARE_PROVIDER_SITE_OTHER): Payer: Self-pay | Admitting: Surgery

## 2013-12-08 DIAGNOSIS — D179 Benign lipomatous neoplasm, unspecified: Secondary | ICD-10-CM

## 2013-12-13 ENCOUNTER — Telehealth (INDEPENDENT_AMBULATORY_CARE_PROVIDER_SITE_OTHER): Payer: Self-pay | Admitting: General Surgery

## 2013-12-13 NOTE — Telephone Encounter (Signed)
-----   Message from Kathaleen Grinder sent at 12/13/2013 12:29 PM EST ----- Regarding: This is for you   ----- Message -----    From: Donnie Mesa, MD    Sent: 12/13/2013   7:55 AM      To: [facility]  Please let him know that this mass in his arm was just a benign lipoma.

## 2013-12-13 NOTE — Telephone Encounter (Signed)
Called patient and informed him that his pathology came back as a benign lipoma.

## 2013-12-15 ENCOUNTER — Ambulatory Visit (INDEPENDENT_AMBULATORY_CARE_PROVIDER_SITE_OTHER): Payer: Commercial Managed Care - HMO | Admitting: Family Medicine

## 2013-12-15 VITALS — BP 120/80 | HR 71 | Temp 97.7°F | Resp 16 | Ht 67.5 in | Wt 170.0 lb

## 2013-12-15 DIAGNOSIS — J019 Acute sinusitis, unspecified: Secondary | ICD-10-CM

## 2013-12-15 DIAGNOSIS — J069 Acute upper respiratory infection, unspecified: Secondary | ICD-10-CM

## 2013-12-15 DIAGNOSIS — J209 Acute bronchitis, unspecified: Secondary | ICD-10-CM

## 2013-12-15 MED ORDER — FLUTICASONE PROPIONATE 50 MCG/ACT NA SUSP: NASAL | Status: DC

## 2013-12-15 MED ORDER — AMOXICILLIN 875 MG PO TABS: 875.0000 mg | ORAL_TABLET | Freq: Two times a day (BID) | ORAL | Status: DC

## 2013-12-15 NOTE — Patient Instructions (Signed)
Continue taking over-the-counter symptomatic treatment for the cough. You can take something like Claritin-D or Allegra-D or Zyrtec-D for the congestion if needed.  Amoxicillin 875 one twice daily for infection  Drink plenty of fluids and get enough rest  Return if getting worse  In order to try to keep the wax from building up in the ears I recommend using some Debrox in the years about every 4 months or so to keep from the building.

## 2013-12-15 NOTE — Progress Notes (Signed)
Subjective: 68 year old man has a two-week history of upper respiratory congestion. It is not going away. He has been ill. He has a stuffiness in his left ear. He has a history of wax problems. He continues to work and many people in the job place of been ill with this. He is coughing. He does not smoke.   Objective: No acute distress but he is constantly sniffling. He has a frequent cough. His TMs are both occluded by cerumen. Nose congested. Throat looks clear. Neck supple without significant nodes. Chest is clear to auscultation. Heart regular without murmurs.  Assessment: Upper respiratory infection Sinusitis Bronchitis Bilateral cerumen impaction  Plan: Irrigate ears Amoxicillin 875 twice daily for 10 days Fluticasone spray  Ears both look normal after irrigation of large amounts of cerumen.

## 2014-11-22 ENCOUNTER — Telehealth: Payer: Self-pay | Admitting: Hematology and Oncology

## 2014-11-22 ENCOUNTER — Encounter: Payer: Self-pay | Admitting: Hematology and Oncology

## 2014-11-22 ENCOUNTER — Ambulatory Visit (HOSPITAL_BASED_OUTPATIENT_CLINIC_OR_DEPARTMENT_OTHER): Payer: Commercial Managed Care - HMO | Admitting: Hematology and Oncology

## 2014-11-22 ENCOUNTER — Other Ambulatory Visit (HOSPITAL_BASED_OUTPATIENT_CLINIC_OR_DEPARTMENT_OTHER): Payer: Commercial Managed Care - HMO

## 2014-11-22 VITALS — BP 145/70 | HR 98 | Temp 98.3°F | Resp 18 | Ht 67.5 in | Wt 165.7 lb

## 2014-11-22 DIAGNOSIS — C9110 Chronic lymphocytic leukemia of B-cell type not having achieved remission: Secondary | ICD-10-CM

## 2014-11-22 DIAGNOSIS — R599 Enlarged lymph nodes, unspecified: Secondary | ICD-10-CM

## 2014-11-22 DIAGNOSIS — R591 Generalized enlarged lymph nodes: Secondary | ICD-10-CM | POA: Insufficient documentation

## 2014-11-22 LAB — COMPREHENSIVE METABOLIC PANEL (CC13)
ALBUMIN: 4.1 g/dL (ref 3.5–5.0)
ALT: 14 U/L (ref 0–55)
AST: 13 U/L (ref 5–34)
Alkaline Phosphatase: 67 U/L (ref 40–150)
Anion Gap: 10 mEq/L (ref 3–11)
BILIRUBIN TOTAL: 0.64 mg/dL (ref 0.20–1.20)
BUN: 10.5 mg/dL (ref 7.0–26.0)
CO2: 24 meq/L (ref 22–29)
CREATININE: 1.3 mg/dL (ref 0.7–1.3)
Calcium: 9.1 mg/dL (ref 8.4–10.4)
Chloride: 106 mEq/L (ref 98–109)
EGFR: 54 mL/min/{1.73_m2} — ABNORMAL LOW (ref >90)
GLUCOSE: 154 mg/dL — AB (ref 70–140)
Potassium: 3.8 mEq/L (ref 3.5–5.1)
SODIUM: 140 meq/L (ref 136–145)
TOTAL PROTEIN: 6.4 g/dL (ref 6.4–8.3)

## 2014-11-22 LAB — CBC WITH DIFFERENTIAL/PLATELET
BASO%: 0.2 % (ref 0.0–2.0)
Basophils Absolute: 0.1 10*3/uL (ref 0.0–0.1)
EOS%: 0.2 % (ref 0.0–7.0)
Eosinophils Absolute: 0.1 10*3/uL (ref 0.0–0.5)
HEMATOCRIT: 44.5 % (ref 38.4–49.9)
HGB: 14.1 g/dL (ref 13.0–17.1)
LYMPH#: 28.8 10*3/uL — AB (ref 0.9–3.3)
LYMPH%: 84.6 % — ABNORMAL HIGH (ref 14.0–49.0)
MCH: 27.9 pg (ref 27.2–33.4)
MCHC: 31.7 g/dL — ABNORMAL LOW (ref 32.0–36.0)
MCV: 88 fL (ref 79.3–98.0)
MONO#: 0.7 10*3/uL (ref 0.1–0.9)
MONO%: 1.9 % (ref 0.0–14.0)
NEUT#: 4.5 10*3/uL (ref 1.5–6.5)
NEUT%: 13.1 % — AB (ref 39.0–75.0)
Platelets: 179 10*3/uL (ref 140–400)
RBC: 5.05 10*6/uL (ref 4.20–5.82)
RDW: 14.7 % — ABNORMAL HIGH (ref 11.0–14.6)
WBC: 34.1 10*3/uL — AB (ref 4.0–10.3)

## 2014-11-22 LAB — LACTATE DEHYDROGENASE (CC13): LDH: 150 U/L (ref 125–245)

## 2014-11-22 NOTE — Assessment & Plan Note (Signed)
Clinically, his blood work appears stable. I will continue his yearly followup for CLL with history, physical examination and blood work.

## 2014-11-22 NOTE — Assessment & Plan Note (Signed)
The patient has 2 small lymph nodes in the left side of the neck which are new compared to prior exam. They are non-tender and they do not bother him. He thought they could be reactive in nature due to recent nasal congestion. I educated the patient to watch out for disease progression. His blood count actually show decline in actual lymphocyte count.

## 2014-11-22 NOTE — Progress Notes (Signed)
[place] OFFICE PROGRESS NOTE  Patient Care Team: Robyn Haber, MD as PCP - General (Family Medicine) Heath Lark, MD as Consulting Physician (Hematology and Oncology)  SUMMARY OF ONCOLOGIC HISTORY:  Tyler Wu was transferred to my care after his prior physician has left.  I reviewed the patient's records extensive and collaborated the history with the patient. Summary of his history is as follows: He presented with an episode of severe pharyngitis back in March of 2005 and was found to have leukocytosis with absolute lymphocytosis. Diagnosis of chronic lymphocytic leukemia, RAI stage 0 was made based on typical CBC findings and peripheral blood flow cytometry. He initial total his white count was about 16,000 at that time. White count has slowly drifted up into the 30-50,000 range with 80% to 90% lymphocytes but has been overall stable at this level for at least the last 7 years recorded in the computer since August of 2007. He has not developed anemia or thrombocytopenia. He has not had any history of recurrent or severe infections. He has never had a herpes zoster infection. Total serum immunoglobulins run in the low normal range with IgM consistently low. No monoclonal proteins on IFE. No signs of any paraneoplastic hemolytic anemia with normal reticulocyte count and negative Coombs test. His only new complaint today is related to multiple skin nodules. 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 He underwent excision of the lump in November 2015 and it came back benign  INTERVAL HISTORY: Please see below for problem oriented charting. He feels well and is working full-time. He noticed some nasal congestion in his throat. He denies lymphadenopathy. He saw a dermatologist recently but no new skin cancer was detected.  REVIEW OF SYSTEMS:   Constitutional: Denies fevers, chills or abnormal weight  loss Eyes: Denies blurriness of vision Ears, nose, mouth, throat, and face: Denies mucositis or sore throat Respiratory: Denies cough, dyspnea or wheezes Cardiovascular: Denies palpitation, chest discomfort or lower extremity swelling Gastrointestinal:  Denies nausea, heartburn or change in bowel habits Skin: Denies abnormal skin rashes Lymphatics: Denies new lymphadenopathy or easy bruising Neurological:Denies numbness, tingling or new weaknesses Behavioral/Psych: Mood is stable, no new changes  All other systems were reviewed with the patient and are negative.  I have reviewed the past medical history, past surgical history, social history and family history with the patient and they are unchanged from previous note.  ALLERGIES:  is allergic to levofloxacin.  MEDICATIONS:  Current Outpatient Prescriptions  Medication Sig Dispense Refill  . aspirin 81 MG tablet Take 81 mg by mouth daily.    Marland Kitchen loratadine (CLARITIN) 10 MG tablet Take 10 mg by mouth daily.     No current facility-administered medications for this visit.    PHYSICAL EXAMINATION: ECOG PERFORMANCE STATUS: 0 - Asymptomatic  Filed Vitals:   11/22/14 1334  BP: 145/70  Pulse: 98  Temp: 98.3 F (36.8 C)  Resp: 18   Filed Weights   11/22/14 1334  Weight: 165 lb 11.2 oz (75.161 kg)    GENERAL:alert, no distress and comfortable SKIN: skin color, texture, turgor are normal, no rashes or significant lesions EYES: normal, Conjunctiva are pink and non-injected, sclera clear OROPHARYNX:no exudate, no erythema and lips, buccal mucosa, and tongue normal  NECK: supple, thyroid normal size, non-tender, without nodularity LYMPH: Palpable lymphadenopathy in the left side of the neck. LUNGS: clear to auscultation and percussion with normal breathing effort HEART:  regular rate & rhythm and no murmurs and no lower extremity edema ABDOMEN:abdomen soft, non-tender and normal bowel sounds Musculoskeletal:no cyanosis of digits and  no clubbing  NEURO: alert & oriented x 3 with fluent speech, no focal motor/sensory deficits  LABORATORY DATA:  I have reviewed the data as listed    Component Value Date/Time   NA 140 11/22/2014 1320   NA 141 07/14/2012 0845   K 3.8 11/22/2014 1320   K 4.1 07/14/2012 0845   CL 106 07/14/2012 0845   CL 107 11/20/2011 1011   CO2 24 11/22/2014 1320   CO2 26 07/14/2012 0845   GLUCOSE 154* 11/22/2014 1320   GLUCOSE 94 07/14/2012 0845   GLUCOSE 87 11/20/2011 1011   BUN 10.5 11/22/2014 1320   BUN 17 07/14/2012 0845   CREATININE 1.3 11/22/2014 1320   CREATININE 1.22 07/14/2012 0845   CREATININE 1.24 11/14/2010 1407   CALCIUM 9.1 11/22/2014 1320   CALCIUM 9.2 07/14/2012 0845   PROT 6.4 11/22/2014 1320   PROT 6.6 07/14/2012 0845   ALBUMIN 4.1 11/22/2014 1320   ALBUMIN 4.8 07/14/2012 0845   AST 13 11/22/2014 1320   AST 17 07/14/2012 0845   ALT 14 11/22/2014 1320   ALT 19 07/14/2012 0845   ALKPHOS 67 11/22/2014 1320   ALKPHOS 61 07/14/2012 0845   BILITOT 0.64 11/22/2014 1320   BILITOT 0.7 07/14/2012 0845    No results found for: SPEP, UPEP  Lab Results  Component Value Date   WBC 34.1* 11/22/2014   NEUTROABS 4.5 11/22/2014   HGB 14.1 11/22/2014   HCT 44.5 11/22/2014   MCV 88.0 11/22/2014   PLT 179 11/22/2014      Chemistry      Component Value Date/Time   NA 140 11/22/2014 1320   NA 141 07/14/2012 0845   K 3.8 11/22/2014 1320   K 4.1 07/14/2012 0845   CL 106 07/14/2012 0845   CL 107 11/20/2011 1011   CO2 24 11/22/2014 1320   CO2 26 07/14/2012 0845   BUN 10.5 11/22/2014 1320   BUN 17 07/14/2012 0845   CREATININE 1.3 11/22/2014 1320   CREATININE 1.22 07/14/2012 0845   CREATININE 1.24 11/14/2010 1407      Component Value Date/Time   CALCIUM 9.1 11/22/2014 1320   CALCIUM 9.2 07/14/2012 0845   ALKPHOS 67 11/22/2014 1320   ALKPHOS 61 07/14/2012 0845   AST 13 11/22/2014 1320   AST 17 07/14/2012 0845   ALT 14 11/22/2014 1320   ALT 19 07/14/2012 0845   BILITOT  0.64 11/22/2014 1320   BILITOT 0.7 07/14/2012 0845      ASSESSMENT & PLAN:  CLL (chronic lymphocytic leukemia) Clinically, his blood work appears stable. I will continue his yearly followup for CLL with history, physical examination and blood work.    Lymphadenopathy of head and neck The patient has 2 small lymph nodes in the left side of the neck which are new compared to prior exam. They are non-tender and they do not bother him. He thought they could be reactive in nature due to recent nasal congestion. I educated the patient to watch out for disease progression. His blood count actually show decline in actual lymphocyte count.    Orders Placed This Encounter  Procedures  . CBC with Differential/Platelet    Standing Status: Future     Number of Occurrences:      Standing Expiration Date: 12/27/2015   All questions were answered. The patient knows to call the clinic with  any problems, questions or concerns. No barriers to learning was detected. I spent 15 minutes counseling the patient face to face. The total time spent in the appointment was 20 minutes and more than 50% was on counseling and review of test results     [facility], [place], MD 11/22/2014 2:00 PM

## 2014-11-22 NOTE — Telephone Encounter (Signed)
per pof to sch pt appt-gave cpt copy of avs

## 2015-03-14 ENCOUNTER — Encounter: Payer: Self-pay | Admitting: Urgent Care

## 2015-03-14 ENCOUNTER — Ambulatory Visit (INDEPENDENT_AMBULATORY_CARE_PROVIDER_SITE_OTHER): Payer: Commercial Managed Care - HMO | Admitting: Urgent Care

## 2015-03-14 VITALS — BP 124/63 | HR 75 | Temp 98.1°F | Resp 16 | Ht 67.0 in | Wt 165.0 lb

## 2015-03-14 DIAGNOSIS — K5900 Constipation, unspecified: Secondary | ICD-10-CM | POA: Diagnosis not present

## 2015-03-14 DIAGNOSIS — D179 Benign lipomatous neoplasm, unspecified: Secondary | ICD-10-CM | POA: Diagnosis not present

## 2015-03-14 DIAGNOSIS — Z808 Family history of malignant neoplasm of other organs or systems: Secondary | ICD-10-CM | POA: Diagnosis not present

## 2015-03-14 DIAGNOSIS — E882 Lipomatosis, not elsewhere classified: Secondary | ICD-10-CM

## 2015-03-14 DIAGNOSIS — L989 Disorder of the skin and subcutaneous tissue, unspecified: Secondary | ICD-10-CM | POA: Diagnosis not present

## 2015-03-14 DIAGNOSIS — C9110 Chronic lymphocytic leukemia of B-cell type not having achieved remission: Secondary | ICD-10-CM

## 2015-03-14 DIAGNOSIS — Z0000 Encounter for general adult medical examination without abnormal findings: Secondary | ICD-10-CM

## 2015-03-14 DIAGNOSIS — L821 Other seborrheic keratosis: Secondary | ICD-10-CM

## 2015-03-14 LAB — COMPREHENSIVE METABOLIC PANEL
ALBUMIN: 4.7 g/dL (ref 3.6–5.1)
ALK PHOS: 63 U/L (ref 40–115)
ALT: 15 U/L (ref 9–46)
AST: 17 U/L (ref 10–35)
BILIRUBIN TOTAL: 0.7 mg/dL (ref 0.2–1.2)
BUN: 13 mg/dL (ref 7–25)
CALCIUM: 9.4 mg/dL (ref 8.6–10.3)
CO2: 24 mmol/L (ref 20–31)
CREATININE: 1.29 mg/dL — AB (ref 0.70–1.25)
Chloride: 103 mmol/L (ref 98–110)
Glucose, Bld: 93 mg/dL (ref 65–99)
POTASSIUM: 4 mmol/L (ref 3.5–5.3)
Sodium: 141 mmol/L (ref 135–146)
TOTAL PROTEIN: 6.7 g/dL (ref 6.1–8.1)

## 2015-03-14 LAB — CBC WITH DIFFERENTIAL/PLATELET
BASOS ABS: 0 10*3/uL (ref 0.0–0.1)
Basophils Relative: 0 % (ref 0–1)
EOS PCT: 1 % (ref 0–5)
Eosinophils Absolute: 0.3 10*3/uL (ref 0.0–0.7)
HEMATOCRIT: 46.2 % (ref 39.0–52.0)
Hemoglobin: 15.7 g/dL (ref 13.0–17.0)
LYMPHS ABS: 20.9 10*3/uL — AB (ref 0.7–4.0)
LYMPHS PCT: 76 % — AB (ref 12–46)
MCH: 29.5 pg (ref 26.0–34.0)
MCHC: 34 g/dL (ref 30.0–36.0)
MCV: 86.7 fL (ref 78.0–100.0)
MPV: 10 fL (ref 8.6–12.4)
Monocytes Absolute: 0.6 10*3/uL (ref 0.1–1.0)
Monocytes Relative: 2 % — ABNORMAL LOW (ref 3–12)
NEUTROS PCT: 21 % — AB (ref 43–77)
Neutro Abs: 5.8 10*3/uL (ref 1.7–7.7)
Platelets: 182 10*3/uL (ref 150–400)
RBC: 5.33 MIL/uL (ref 4.22–5.81)
RDW: 14.6 % (ref 11.5–15.5)
WBC: 27.5 10*3/uL — ABNORMAL HIGH (ref 4.0–10.5)

## 2015-03-14 NOTE — Progress Notes (Signed)
MRN: [id number]  Subjective:   [name] is a 70 y.o. male presenting for annual physical exam and constipation.  Medical care team includes: PCP: Robyn Haber, MD Vision - Wears glasses, sees Dr. Clydene Laming with [facility]. He has an appointment next week 02/2015. Dental - Patient just obtained dental insurance, plans on setting up an appointment soon. Specialists:  Oncologist - sees Dr. Alvy Bimler regularly for CLL. Has been doing well. Follow up in 10/2015.   Tyler Wu is married >40 years, has 2 adult children. Denies any difficulties in his relationships, has good support network. 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 Patient denies depression, sadness, irritability, insomnia. He is able to perform his ADLs without difficulty. Denies smoking cigarettes, drinks ~1 alcohol drink per week.   Constipation - reports 2-3 days in between bowel movements. Admits that when he increases his level of activity, this resolves the issue. Also states that he does not eat enough fiber but does try to hydrate well. Denies ROS as below.  Patient has CLL (chronic lymphocytic leukemia) ([place]); Lipomatosis; Multiple skin nodules; and Lymphadenopathy of head and neck on his problem list. [name] has a current medication list which includes the following prescription(s): aspirin, loratadine, and probiotic product. He is allergic to levofloxacin.  Tyler Wu  has a past medical history of CLL (chronic lymphocytic leukemia) ([place]) (2005) and Allergy. Also  has past surgical history that includes Hernia repair (1996).  His family history includes Cancer (age of onset: 57) in his father; Heart attack in his maternal grandfather and maternal grandmother; Hypertension in his maternal grandfather and maternal grandmother.  Immunizations: Flu shot on 10/27/2014  Review of Systems  Constitutional: Negative for fever, chills, weight loss,  malaise/fatigue and diaphoresis.  HENT: Negative for congestion, ear discharge, ear pain, hearing loss, nosebleeds, sore throat and tinnitus.   Eyes: Negative for blurred vision, double vision, photophobia, pain, discharge and redness.  Respiratory: Negative for cough, shortness of breath and wheezing.   Cardiovascular: Negative for chest pain, palpitations and leg swelling.  Gastrointestinal: Positive for constipation. Negative for nausea, vomiting, abdominal pain, diarrhea and blood in stool.  Genitourinary: Negative for dysuria, urgency, frequency, hematuria and flank pain.  Musculoskeletal: Negative for myalgias, back pain and joint pain.  Skin: Negative for itching and rash.  Neurological: Negative for dizziness, tingling, seizures, loss of consciousness, weakness and headaches.  Endo/Heme/Allergies: Positive for environmental allergies. Negative for polydipsia.  Psychiatric/Behavioral: Negative for depression, suicidal ideas, hallucinations, memory loss and substance abuse. The patient is not nervous/anxious and does not have insomnia.      Objective:   Vitals: BP 124/63 mmHg  Pulse 75  Temp(Src) 98.1 F (36.7 C) (Oral)  Resp 16  Ht 5\' 7"  (1.702 m)  Wt 165 lb (74.844 kg)  BMI 25.84 kg/m2  SpO2 97%  Physical Exam  Constitutional: He is oriented to person, place, and time. He appears well-developed and well-nourished.  HENT:  TM's intact bilaterally, no effusions or erythema. Nasal turbinates pink and moist, nasal passages patent. No sinus tenderness. Oropharynx clear, mucous membranes moist, dentition in good repair.  Eyes: Conjunctivae and EOM are normal. Pupils are equal, round, and reactive to light. Right eye exhibits no discharge. Left eye exhibits no discharge. No scleral icterus.  Neck: Normal range of motion. Neck supple. No thyromegaly present.  Cardiovascular: Normal rate, regular rhythm and intact distal pulses.  Exam reveals no gallop  and no friction rub.   No  murmur heard. Pulmonary/Chest: No stridor. No respiratory distress. He has no wheezes. He has no rales.  Abdominal: Soft. Bowel sounds are normal. He exhibits no distension and no mass. There is no tenderness.  Musculoskeletal: Normal range of motion. He exhibits no edema or tenderness.  Lymphadenopathy:    He has no cervical adenopathy.  Neurological: He is alert and oriented to person, place, and time. He has normal reflexes. Coordination normal.  Skin: Skin is warm and dry. Lesion (multiple brown hyperkeratotic lesions over back) noted. No rash noted. No erythema. No pallor.     Multiple compressible, mobile masses over forearms, thighs. Not new, non-tender.  Psychiatric: He has a normal mood and affect.   Assessment and Plan :   1. Encounter for Medicare annual wellness exam - Patient is medically stable, labs pending - Discussed healthy lifestyle, diet, exercise, preventative care, vaccinations, and addressed patient's concerns.    2. CLL (chronic lymphocytic leukemia) (H[facility]) - Stable, continue f/u with Dr. Alvy Bimler  3. Constipation, unspecified constipation type - Counseled on diagnosis of constipation. Patient will increase fiber intake and walk more often for exercise. I advised that he can use Miralax as needed.  4. Multiple lipomas 5. Lipomatosis - Reassured patient that I am in agreement with Dr. Alvy Bimler and other providers regarding lipomatosis, he would prefer not to remove any for now. Continue to monitor.   6. Seborrheic keratosis 7. Skin lesion 8. Family history of melanoma - One skin lesion is worrisome to patient especially given his family history. so I will refer him to Dr. Allyson Sabal. He has previously had skin lesions removed which were benign. I do believe that it is seborrheic keratosis however and reassured patient. - Ambulatory referral to Dermatology   Jaynee Eagles, PA-C [facility] [phone] 03/14/2015  8:38  AM

## 2015-03-14 NOTE — Patient Instructions (Addendum)
Keeping you healthy  Get these tests  Blood pressure- Have your blood pressure checked once a year by your healthcare provider.  Normal blood pressure is 120/80  Weight- Have your body mass index (BMI) calculated to screen for obesity.  BMI is a measure of body fat based on height and weight. You can also calculate your own BMI at ViewBanking.[email or web address].  Cholesterol- Have your cholesterol checked every year.  Diabetes- Have your blood sugar checked regularly if you have high blood pressure, high cholesterol, have a family history of diabetes or if you are overweight.  Screening for Colon Cancer- Colonoscopy starting at age 80.  Screening may begin sooner depending on your family history and other health conditions. Follow up colonoscopy as directed by your Gastroenterologist.  Screening for Prostate Cancer- Both blood work (PSA) and a rectal exam help screen for Prostate Cancer.  Screening begins at age 33 with African-American men and at age 76 with Caucasian men.  Screening may begin sooner depending on your family history.  Take these medicines  Aspirin- One aspirin daily can help prevent Heart disease and Stroke.  Flu shot- Every fall.  Tetanus- Every 10 years.  Zostavax- Once after the age of 72 to prevent Shingles.  Pneumonia shot- Once after the age of 66; if you are younger than 27, ask your healthcare provider if you need a Pneumonia shot.  Take these steps  Don't smoke- If you do smoke, talk to your doctor about quitting.  For tips on how to quit, go to www.smokefree.gov or call 1-800-QUIT-NOW.  Be physically active- Exercise 5 days a week for at least 30 minutes.  If you are not already physically active start slow and gradually work up to 30 minutes of moderate physical activity.  Examples of moderate activity include walking briskly, mowing the yard, dancing, swimming, bicycling, etc.  Eat a healthy diet- Eat a variety of healthy food such as fruits, vegetables, low  fat milk, low fat cheese, yogurt, lean meant, poultry, fish, beans, tofu, etc. For more information go to www.thenutritionsource.org  Drink alcohol in moderation- Limit alcohol intake to less than two drinks a day. Never drink and drive.  Dentist- Brush and floss twice daily; visit your dentist twice a year.  Depression- Your emotional health is as important as your physical health. If you're feeling down, or losing interest in things you would normally enjoy please talk to your healthcare provider.  Eye exam- Visit your eye doctor every year.  Safe sex- If you may be exposed to a sexually transmitted infection, use a condom.  Seat belts- Seat belts can save your life; always wear one.  Smoke/Carbon Monoxide detectors- These detectors need to be installed on the appropriate level of your home.  Replace batteries at least once a year.  Skin cancer- When out in the sun, cover up and use sunscreen 15 SPF or higher.  Violence- If anyone is threatening you, please tell your healthcare provider.  Living Will/ Health care power of attorney- Speak with your healthcare provider and family.    Constipation, Adult Constipation is when a person has fewer than three bowel movements a week, has difficulty having a bowel movement, or has stools that are dry, hard, or larger than normal. As people grow older, constipation is more common. A low-fiber diet, not taking in enough fluids, and taking certain medicines may make constipation worse.  CAUSES   Certain medicines, such as antidepressants, pain medicine, iron supplements, antacids, and water pills.   Certain  diseases, such as diabetes, irritable bowel syndrome (IBS), thyroid disease, or depression.   Not drinking enough water.   Not eating enough fiber-rich foods.   Stress or travel.   Lack of physical activity or exercise.   Ignoring the urge to have a bowel movement.   Using laxatives too much.  SIGNS AND SYMPTOMS   Having  fewer than three bowel movements a week.   Straining to have a bowel movement.   Having stools that are hard, dry, or larger than normal.   Feeling full or bloated.   Pain in the lower abdomen.   Not feeling relief after having a bowel movement.  DIAGNOSIS  Your health care provider will take a medical history and perform a physical exam. Further testing may be done for severe constipation. Some tests may include:  A barium enema X-ray to examine your rectum, colon, and, sometimes, your small intestine.   A sigmoidoscopy to examine your lower colon.   A colonoscopy to examine your entire colon. TREATMENT  Treatment will depend on the severity of your constipation and what is causing it. Some dietary treatments include drinking more fluids and eating more fiber-rich foods. Lifestyle treatments may include regular exercise. If these diet and lifestyle recommendations do not help, your health care provider may recommend taking over-the-counter laxative medicines to help you have bowel movements. Prescription medicines may be prescribed if over-the-counter medicines do not work.  HOME CARE INSTRUCTIONS   Eat foods that have a lot of fiber, such as fruits, vegetables, whole grains, and beans.  Limit foods high in fat and processed sugars, such as french fries, hamburgers, cookies, candies, and soda.   A fiber supplement may be added to your diet if you cannot get enough fiber from foods.   Drink enough fluids to keep your urine clear or pale yellow.   Exercise regularly or as directed by your health care provider.   Go to the restroom when you have the urge to go. Do not hold it.   Only take over-the-counter or prescription medicines as directed by your health care provider. Do not take other medicines for constipation without talking to your health care provider first.  [place] IF:   You have bright red blood in your stool.   Your constipation  lasts for more than 4 days or gets worse.   You have abdominal or rectal pain.   You have thin, pencil-like stools.   You have unexplained weight loss. MAKE SURE YOU:   Understand these instructions.  Will watch your condition.  Will get help right away if you are not doing well or get worse.   This information is not intended to replace advice given to you by your health care provider. Make sure you discuss any questions you have with your health care provider.   Document Released: 10/11/2003 Document Revised: 02/02/2014 Document Reviewed: 10/24/2012 Elsevier Interactive Patient Education 2016 Elsevier Inc.   Lipoma A lipoma is a noncancerous (benign) tumor that is made up of fat cells. This is a very common type of soft-tissue growth. Lipomas are usually found under the skin (subcutaneous). They may occur in any tissue of the body that contains fat. Common areas for lipomas to appear include the back, shoulders, buttocks, and thighs. Lipomas grow slowly, and they are usually painless. Most lipomas do not cause problems and do not require treatment. CAUSES The cause of this condition is not known. RISK FACTORS This condition is more likely to develop  in:  People who are 59-86 years old.  People who have a family history of lipomas. SYMPTOMS A lipoma usually appears as a small, round bump under the skin. It may feel soft or rubbery, but the firmness can vary. Most lipomas are not painful. However, a lipoma may become painful if it is located in an area where it pushes on nerves. DIAGNOSIS A lipoma can usually be diagnosed with a physical exam. You may also have tests to confirm the diagnosis and to rule out other conditions. Tests may include:  Imaging tests, such as a CT scan or MRI.  Removal of a tissue sample to be looked at under a microscope (biopsy). TREATMENT Treatment is not needed for small lipomas that are not causing problems. If a lipoma continues to get bigger  or it causes problems, removal is often the best option. Lipomas can also be removed to improve appearance. Removal of a lipoma is usually done with a surgery in which the fatty cells and the surrounding capsule are removed. Most often, a medicine that numbs the area (local anesthetic) is used for this procedure. HOME CARE INSTRUCTIONS  Keep all follow-up visits as directed by your health care provider. This is important. SEEK MEDICAL CARE IF:  Your lipoma becomes larger or hard.  Your lipoma becomes painful, red, or increasingly swollen. These could be signs of infection or a more serious condition.   This information is not intended to replace advice given to you by your health care provider. Make sure you discuss any questions you have with your health care provider.   Document Released: 01/02/2002 Document Revised: 05/29/2014 Document Reviewed: 01/08/2014 Elsevier Interactive Patient Education [employer].

## 2015-03-15 LAB — PATHOLOGIST SMEAR REVIEW

## 2015-04-24 DIAGNOSIS — H6121 Impacted cerumen, right ear: Secondary | ICD-10-CM | POA: Diagnosis not present

## 2015-04-24 DIAGNOSIS — H6122 Impacted cerumen, left ear: Secondary | ICD-10-CM | POA: Diagnosis not present

## 2015-04-24 DIAGNOSIS — R42 Dizziness and giddiness: Secondary | ICD-10-CM | POA: Diagnosis not present

## 2015-07-11 DIAGNOSIS — H3561 Retinal hemorrhage, right eye: Secondary | ICD-10-CM | POA: Diagnosis not present

## 2015-07-11 DIAGNOSIS — H35373 Puckering of macula, bilateral: Secondary | ICD-10-CM | POA: Diagnosis not present

## 2015-07-11 DIAGNOSIS — H2513 Age-related nuclear cataract, bilateral: Secondary | ICD-10-CM | POA: Diagnosis not present

## 2015-11-21 ENCOUNTER — Encounter: Payer: Self-pay | Admitting: Hematology and Oncology

## 2015-11-21 ENCOUNTER — Telehealth: Payer: Self-pay | Admitting: Hematology and Oncology

## 2015-11-21 ENCOUNTER — Other Ambulatory Visit (HOSPITAL_BASED_OUTPATIENT_CLINIC_OR_DEPARTMENT_OTHER): Payer: Commercial Managed Care - HMO

## 2015-11-21 ENCOUNTER — Ambulatory Visit (HOSPITAL_BASED_OUTPATIENT_CLINIC_OR_DEPARTMENT_OTHER): Payer: Commercial Managed Care - HMO | Admitting: Hematology and Oncology

## 2015-11-21 VITALS — BP 142/65 | HR 71 | Temp 97.9°F | Resp 18 | Ht 67.0 in | Wt 167.1 lb

## 2015-11-21 DIAGNOSIS — C9110 Chronic lymphocytic leukemia of B-cell type not having achieved remission: Secondary | ICD-10-CM

## 2015-11-21 DIAGNOSIS — R229 Localized swelling, mass and lump, unspecified: Secondary | ICD-10-CM | POA: Diagnosis not present

## 2015-11-21 LAB — CBC WITH DIFFERENTIAL/PLATELET
BASO%: 0.2 % (ref 0.0–2.0)
Basophils Absolute: 0.1 10*3/uL (ref 0.0–0.1)
EOS ABS: 0.1 10*3/uL (ref 0.0–0.5)
EOS%: 0.3 % (ref 0.0–7.0)
HEMATOCRIT: 44.9 % (ref 38.4–49.9)
HGB: 14.7 g/dL (ref 13.0–17.1)
LYMPH#: 29.9 10*3/uL — AB (ref 0.9–3.3)
LYMPH%: 84.3 % — AB (ref 14.0–49.0)
MCH: 28.9 pg (ref 27.2–33.4)
MCHC: 32.7 g/dL (ref 32.0–36.0)
MCV: 88.2 fL (ref 79.3–98.0)
MONO#: 0.8 10*3/uL (ref 0.1–0.9)
MONO%: 2.2 % (ref 0.0–14.0)
NEUT#: 4.6 10*3/uL (ref 1.5–6.5)
NEUT%: 13 % — AB (ref 39.0–75.0)
PLATELETS: 183 10*3/uL (ref 140–400)
RBC: 5.09 10*6/uL (ref 4.20–5.82)
RDW: 14 % (ref 11.0–14.6)
WBC: 35.4 10*3/uL — ABNORMAL HIGH (ref 4.0–10.3)

## 2015-11-21 LAB — TECHNOLOGIST REVIEW

## 2015-11-21 NOTE — Progress Notes (Signed)
Tyler Wu OFFICE PROGRESS NOTE  Patient Care Team: Robyn Haber, MD as PCP - General (Family Medicine) Heath Lark, MD as Consulting Physician (Hematology and Oncology)  SUMMARY OF ONCOLOGIC HISTORY:  Tyler Wu was transferred to my care after his prior physician has left.  I reviewed the patient's records extensive and collaborated the history with the patient. Summary of his history is as follows: He presented with an episode of severe pharyngitis back in March of 2005 and was found to have leukocytosis with absolute lymphocytosis. Diagnosis of chronic lymphocytic leukemia, RAI stage 0 was made based on typical CBC findings and peripheral blood flow cytometry. He initial total his white count was about 16,000 at that time. White count has slowly drifted up into the 30-50,000 range with 80% to 90% lymphocytes but has been overall stable at this level for at least the last 7 years recorded in the computer since August of 2007. He has not developed anemia or thrombocytopenia. He has not had any history of recurrent or severe infections. He has never had a herpes zoster infection. Total serum immunoglobulins run in the low normal range with IgM consistently low. No monoclonal proteins on IFE. No signs of any paraneoplastic hemolytic anemia with normal reticulocyte count and negative Coombs test. His only new complaint today is related to multiple skin nodules. 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 He underwent excision of the lump in November 2015 and it came back benign  INTERVAL HISTORY: Please see below for problem oriented charting. He feels well and is working full-time. He denies lymphadenopathy. He saw a dermatologist recently but no new skin cancer was detected. He denies recent infection. He is up-to-date with all age-appropriate screening modalities  REVIEW OF SYSTEMS:   Constitutional:  Denies fevers, chills or abnormal weight loss Eyes: Denies blurriness of vision Ears, nose, mouth, throat, and face: Denies mucositis or sore throat Respiratory: Denies cough, dyspnea or wheezes Cardiovascular: Denies palpitation, chest discomfort or lower extremity swelling Gastrointestinal:  Denies nausea, heartburn or change in bowel habits Skin: Denies abnormal skin rashes Lymphatics: Denies new lymphadenopathy or easy bruising Neurological:Denies numbness, tingling or new weaknesses Behavioral/Psych: Mood is stable, no new changes  All other systems were reviewed with the patient and are negative.  I have reviewed the past medical history, past surgical history, social history and family history with the patient and they are unchanged from previous note.  ALLERGIES:  is allergic to levofloxacin.  MEDICATIONS:  Current Outpatient Prescriptions  Medication Sig Dispense Refill  . aspirin 81 MG tablet Take 81 mg by mouth daily.    Marland Kitchen loratadine (CLARITIN) 10 MG tablet Take 10 mg by mouth daily.     No current facility-administered medications for this visit.     PHYSICAL EXAMINATION: ECOG PERFORMANCE STATUS: 1 - Symptomatic but completely ambulatory  Vitals:   11/21/15 1326  BP: (!) 142/65  Pulse: 71  Resp: 18  Temp: 97.9 F (36.6 C)   Filed Weights   11/21/15 1326  Weight: 167 lb 1.6 oz (75.8 kg)    GENERAL:alert, no distress and comfortable SKIN: skin color, texture, turgor are normal, no rashes or significant lesions. Noted multiple skin nodules, unchanged EYES: normal, Conjunctiva are pink and non-injected, sclera clear OROPHARYNX:no exudate, no erythema and lips, buccal mucosa, and tongue normal  NECK: supple, thyroid normal size, non-tender, without nodularity LYMPH:  no palpable lymphadenopathy in the cervical,  axillary or inguinal LUNGS: clear to auscultation and percussion with normal breathing effort HEART: regular rate & rhythm and no murmurs and no lower  extremity edema ABDOMEN:abdomen soft, non-tender and normal bowel sounds Musculoskeletal:no cyanosis of digits and no clubbing  NEURO: alert & oriented x 3 with fluent speech, no focal motor/sensory deficits  LABORATORY DATA:  I have reviewed the data as listed    Component Value Date/Time   NA 141 03/14/2015 0841   NA 140 11/22/2014 1320   K 4.0 03/14/2015 0841   K 3.8 11/22/2014 1320   CL 103 03/14/2015 0841   CL 107 11/20/2011 1011   CO2 24 03/14/2015 0841   CO2 24 11/22/2014 1320   GLUCOSE 93 03/14/2015 0841   GLUCOSE 154 (H) 11/22/2014 1320   GLUCOSE 87 11/20/2011 1011   BUN 13 03/14/2015 0841   BUN 10.5 11/22/2014 1320   CREATININE 1.29 (H) 03/14/2015 0841   CREATININE 1.3 11/22/2014 1320   CALCIUM 9.4 03/14/2015 0841   CALCIUM 9.1 11/22/2014 1320   PROT 6.7 03/14/2015 0841   PROT 6.4 11/22/2014 1320   ALBUMIN 4.7 03/14/2015 0841   ALBUMIN 4.1 11/22/2014 1320   AST 17 03/14/2015 0841   AST 13 11/22/2014 1320   ALT 15 03/14/2015 0841   ALT 14 11/22/2014 1320   ALKPHOS 63 03/14/2015 0841   ALKPHOS 67 11/22/2014 1320   BILITOT 0.7 03/14/2015 0841   BILITOT 0.64 11/22/2014 1320    No results found for: SPEP, UPEP  Lab Results  Component Value Date   WBC 35.4 (H) 11/21/2015   NEUTROABS 4.6 11/21/2015   HGB 14.7 11/21/2015   HCT 44.9 11/21/2015   MCV 88.2 11/21/2015   PLT 183 11/21/2015      Chemistry      Component Value Date/Time   NA 141 03/14/2015 0841   NA 140 11/22/2014 1320   K 4.0 03/14/2015 0841   K 3.8 11/22/2014 1320   CL 103 03/14/2015 0841   CL 107 11/20/2011 1011   CO2 24 03/14/2015 0841   CO2 24 11/22/2014 1320   BUN 13 03/14/2015 0841   BUN 10.5 11/22/2014 1320   CREATININE 1.29 (H) 03/14/2015 0841   CREATININE 1.3 11/22/2014 1320      Component Value Date/Time   CALCIUM 9.4 03/14/2015 0841   CALCIUM 9.1 11/22/2014 1320   ALKPHOS 63 03/14/2015 0841   ALKPHOS 67 11/22/2014 1320   AST 17 03/14/2015 0841   AST 13 11/22/2014 1320    ALT 15 03/14/2015 0841   ALT 14 11/22/2014 1320   BILITOT 0.7 03/14/2015 0841   BILITOT 0.64 11/22/2014 1320      ASSESSMENT & PLAN:  CLL (chronic lymphocytic leukemia) Clinically, his blood work appears stable. I will continue his yearly followup for CLL with history, physical examination and blood work.  Multiple skin nodules The majority of the skin nodules are soft and resemble lipoma. He will continue close follow-up with dermatologist   Orders Placed This Encounter  Procedures  . CBC with Differential/Platelet    Standing Status:   Future    Standing Expiration Date:   12/25/2016   All questions were answered. The patient knows to call the clinic with any problems, questions or concerns. No barriers to learning was detected. I spent 15 minutes counseling the patient face to face. The total time spent in the appointment was 20 minutes and more than 50% was on counseling and review of test results     Heath Lark,  MD 11/21/2015 2:07 PM

## 2015-11-21 NOTE — Assessment & Plan Note (Signed)
The majority of the skin nodules are soft and resemble lipoma. He will continue close follow-up with dermatologist

## 2015-11-21 NOTE — Telephone Encounter (Signed)
Appointments scheduled per 10/26 LOS. Patient given AVS report and calendar with next scheduled appointments.

## 2015-11-21 NOTE — Assessment & Plan Note (Signed)
Clinically, his blood work appears stable. I will continue his yearly followup for CLL with history, physical examination and blood work.

## 2016-03-19 ENCOUNTER — Encounter: Payer: Self-pay | Admitting: Urgent Care

## 2016-03-19 ENCOUNTER — Ambulatory Visit (INDEPENDENT_AMBULATORY_CARE_PROVIDER_SITE_OTHER): Payer: Medicare HMO | Admitting: Urgent Care

## 2016-03-19 VITALS — BP 137/76 | HR 63 | Temp 98.0°F | Ht 67.0 in | Wt 169.0 lb

## 2016-03-19 DIAGNOSIS — C9110 Chronic lymphocytic leukemia of B-cell type not having achieved remission: Secondary | ICD-10-CM | POA: Diagnosis not present

## 2016-03-19 DIAGNOSIS — E882 Lipomatosis, not elsewhere classified: Secondary | ICD-10-CM | POA: Diagnosis not present

## 2016-03-19 DIAGNOSIS — Z0000 Encounter for general adult medical examination without abnormal findings: Secondary | ICD-10-CM | POA: Diagnosis not present

## 2016-03-19 DIAGNOSIS — L821 Other seborrheic keratosis: Secondary | ICD-10-CM | POA: Diagnosis not present

## 2016-03-19 NOTE — Patient Instructions (Addendum)
Keeping you healthy  Get these tests  Blood pressure- Have your blood pressure checked once a year by your healthcare provider.  Normal blood pressure is 120/80  Weight- Have your body mass index (BMI) calculated to screen for obesity.  BMI is a measure of body fat based on height and weight. You can also calculate your own BMI at www.nhlbis[place]ort.com/bmi/.  Cholesterol- Have your cholesterol checked every year.  Diabetes- Have your blood sugar checked regularly if you have high blood pressure, high cholesterol, have a family history of diabetes or if you are overweight.  Screening for Colon Cancer- Colonoscopy starting at age 50.  Screening may begin sooner depending on your family history and other health conditions. Follow up colonoscopy as directed by your Gastroenterologist.  Screening for Prostate Cancer- Both blood work (PSA) and a rectal exam help screen for Prostate Cancer.  Screening begins at age 40 with African-American men and at age 50 with Caucasian men.  Screening may begin sooner depending on your family history.  Take these medicines  Aspirin- One aspirin daily can help prevent Heart disease and Stroke.  Flu shot- Every fall.  Tetanus- Every 10 years.  Zostavax- Once after the age of 60 to prevent Shingles.  Pneumonia shot- Once after the age of 65; if you are younger than 65, ask your healthcare provider if you need a Pneumonia shot.  Take these steps  Don't smoke- If you do smoke, talk to your doctor about quitting.  For tips on how to quit, go to www.smokefree.gov or call 1-800-QUIT-NOW.  Be physically active- Exercise 5 days a week for at least 30 minutes.  If you are not already physically active start slow and gradually work up to 30 minutes of moderate physical activity.  Examples of moderate activity include walking briskly, mowing the yard, dancing, swimming, bicycling, etc.  Eat a healthy diet- Eat a variety of healthy food such as fruits, vegetables,  low fat milk, low fat cheese, yogurt, lean meant, poultry, fish, beans, tofu, etc. For more information go to www.thenutritionsource.org  Drink alcohol in moderation- Limit alcohol intake to less than two drinks a day. Never drink and drive.  Dentist- Brush and floss twice daily; visit your dentist twice a year.  Depression- Your emotional health is as important as your physical health. If you're feeling down, or losing interest in things you would normally enjoy please talk to your healthcare provider.  Eye exam- Visit your eye doctor every year.  Safe sex- If you may be exposed to a sexually transmitted infection, use a condom.  Seat belts- Seat belts can save your life; always wear one.  Smoke/Carbon Monoxide detectors- These detectors need to be installed on the appropriate level of your home.  Replace batteries at least once a year.  Skin cancer- When out in the sun, cover up and use sunscreen 15 SPF or higher.  Violence- If anyone is threatening you, please tell your healthcare provider.  Living Will/ Health care power of attorney- Speak with your healthcare provider and family.   IF you received an x-ray today, you will receive an invoice from [facility]. Please contact [facility] at [phone] with questions or concerns regarding your invoice.   IF you received labwork today, you will receive an invoice from [facility]. Please contact [facility] at [phone] with questions or concerns regarding your invoice.   Our billing staff will not be able to assist you with questions regarding bills from these companies.  You will be contacted   with the lab results as soon as they are available. The fastest way to get your results is to activate your My Chart account. Instructions are located on the last page of this paperwork. If you have not heard from us regarding the results in 2 weeks, please contact this office.     

## 2016-03-19 NOTE — Progress Notes (Signed)
MRN: [id number]  Subjective:   [name] is a 71 y.o. male presenting for annual physical exam. Tyler Wu is married >40 years, has 2 adult children. Denies any difficulties in his relationships, has good support network. He works as a Mining engineer, still works full time. Patient denies depression, sadness, irritability, insomnia. He is able to perform his ADLs without difficulty. Denies smoking cigarettes, drinks ~2 beers per week.   Medical care team includes: PCP: Vinh Sachs, PA-C Vision: Wears glasses, sees Dr. Clydene Laming with [facility]. Dental: Just obtained dental insurance, plans on setting up care. Specialists:  CLL - Managed by Dr. Alvy Bimler. Last OV was 11/21/2015, and was stable. F/u recommended for 1 year. Dermatology - Monitoring skin nodules and lipomas. He was previously referred to Dr. Allyson Sabal at his last annual wellness exam. He did not go but would like to go [facility] in [place], [place].  Tyler Wu has a current medication list which includes the following prescription(s): aspirin and loratadine. He is allergic to levofloxacin. He  has a past medical history of Allergy and CLL (chronic lymphocytic leukemia) ([place]) (2005). Also  has a past surgical history that includes Hernia repair (1996). His family history includes Cancer (age of onset: 30) in his father; Heart attack in his maternal grandfather and maternal grandmother; Hypertension in his maternal grandfather and maternal grandmother.  Immunizations: Flu shot was 10/31/2015.  Review of Systems  Constitutional: Negative for chills, diaphoresis, fever, malaise/fatigue and weight loss.  HENT: Negative for congestion, ear discharge, ear pain, hearing loss, nosebleeds, sore throat and tinnitus.   Eyes: Negative for blurred vision, double vision, photophobia, pain, discharge and redness.  Respiratory: Negative for cough, shortness of breath and wheezing.   Cardiovascular: Negative for chest  pain, palpitations and leg swelling.  Gastrointestinal: Negative for abdominal pain, blood in stool, constipation, diarrhea, nausea and vomiting.  Genitourinary: Negative for dysuria, flank pain, frequency, hematuria and urgency.  Musculoskeletal: Negative for back pain, joint pain and myalgias.  Skin: Negative for itching and rash.  Neurological: Negative for dizziness, tingling, seizures, loss of consciousness, weakness and headaches.  Endo/Heme/Allergies: Negative for polydipsia.  Psychiatric/Behavioral: Negative for depression, hallucinations, memory loss, substance abuse and suicidal ideas. The patient is not nervous/anxious and does not have insomnia.     Objective:   Vitals: BP 137/76   Pulse 63   Temp 98 F (36.7 C) (Oral)   Ht 5\' 7"  (1.702 m)   Wt 169 lb (76.7 kg)   BMI 26.47 kg/m   Physical Exam  Constitutional: He is oriented to person, place, and time. He appears well-developed and well-nourished.  HENT:  TM's intact bilaterally, no effusions or erythema. Nasal turbinates pink and moist, nasal passages patent. No sinus tenderness. Oropharynx clear, mucous membranes moist, dentition in good repair.  Eyes: Conjunctivae and EOM are normal. Pupils are equal, round, and reactive to light. Right eye exhibits no discharge. Left eye exhibits no discharge. No scleral icterus.  Neck: Normal range of motion. Neck supple. No thyromegaly present.  Cardiovascular: Normal rate, regular rhythm and intact distal pulses.  Exam reveals no gallop and no friction rub.   No murmur heard. Pulmonary/Chest: No stridor. No respiratory distress. He has no wheezes. He has no rales.  Abdominal: Soft. Bowel sounds are normal. He exhibits no distension and no mass. There is no tenderness.  Musculoskeletal: Normal range of motion. He exhibits no edema or tenderness.  Lymphadenopathy:    He has no cervical adenopathy.  Neurological:  He is alert and oriented to person, place, and time. He has normal  reflexes. He displays normal reflexes.  Skin: Skin is warm and dry. No rash noted. No erythema. No pallor.  Multiple lipomas of varying sizes (largest ~3cm) over arms, upper thighs bilaterally. Multiple brown skin lesions ranging between 1-2cm over back, torso, left face over temporal side.   Psychiatric: He has a normal mood and affect.   Assessment and Plan :   1. Encounter for Medicare annual wellness exam - Medically stable, very pleasant patient. Discussed healthy lifestyle, diet, exercise, preventative care, vaccinations, and addressed patient's concerns. Labs pending.  2. CLL (chronic lymphocytic leukemia) ([place]) - Continue follow up with Dr. Alvy Bimler.  3. Lipomatosis 4. Seborrheic keratosis - Ambulatory referral to Dermatology   Jaynee Eagles, PA-C [facility] [id number] 03/19/2016  9:33 AM

## 2016-03-20 LAB — COMPREHENSIVE METABOLIC PANEL
ALBUMIN: 4.8 g/dL (ref 3.5–4.8)
ALK PHOS: 64 IU/L (ref 39–117)
ALT: 16 IU/L (ref 0–44)
AST: 15 IU/L (ref 0–40)
Albumin/Globulin Ratio: 2.7 — ABNORMAL HIGH (ref 1.2–2.2)
BUN / CREAT RATIO: 10 (ref 10–24)
BUN: 13 mg/dL (ref 8–27)
Bilirubin Total: 0.5 mg/dL (ref 0.0–1.2)
CO2: 24 mmol/L (ref 18–29)
CREATININE: 1.27 mg/dL (ref 0.76–1.27)
Calcium: 9.3 mg/dL (ref 8.6–10.2)
Chloride: 102 mmol/L (ref 96–106)
GFR calc Af Amer: 66 (ref >59)
GFR calc non Af Amer: 57 — ABNORMAL LOW (ref >59)
GLUCOSE: 92 mg/dL (ref 65–99)
Globulin, Total: 1.8 (ref 1.5–4.5)
Potassium: 4.6 mmol/L (ref 3.5–5.2)
Sodium: 142 mmol/L (ref 134–144)
TOTAL PROTEIN: 6.6 g/dL (ref 6.0–8.5)

## 2016-03-25 ENCOUNTER — Encounter: Payer: Self-pay | Admitting: Urgent Care

## 2016-04-15 ENCOUNTER — Encounter: Payer: Self-pay | Admitting: Urgent Care

## 2016-04-15 NOTE — Telephone Encounter (Signed)
Can you see the whereabouts of this?

## 2016-06-17 DIAGNOSIS — L814 Other melanin hyperpigmentation: Secondary | ICD-10-CM | POA: Diagnosis not present

## 2016-06-17 DIAGNOSIS — D1801 Hemangioma of skin and subcutaneous tissue: Secondary | ICD-10-CM | POA: Diagnosis not present

## 2016-06-17 DIAGNOSIS — L820 Inflamed seborrheic keratosis: Secondary | ICD-10-CM | POA: Diagnosis not present

## 2016-06-17 DIAGNOSIS — D1722 Benign lipomatous neoplasm of skin and subcutaneous tissue of left arm: Secondary | ICD-10-CM | POA: Diagnosis not present

## 2016-06-17 DIAGNOSIS — L821 Other seborrheic keratosis: Secondary | ICD-10-CM | POA: Diagnosis not present

## 2016-08-11 DIAGNOSIS — D485 Neoplasm of uncertain behavior of skin: Secondary | ICD-10-CM | POA: Diagnosis not present

## 2016-08-11 DIAGNOSIS — L821 Other seborrheic keratosis: Secondary | ICD-10-CM | POA: Diagnosis not present

## 2016-08-11 DIAGNOSIS — D1801 Hemangioma of skin and subcutaneous tissue: Secondary | ICD-10-CM | POA: Diagnosis not present

## 2016-08-15 ENCOUNTER — Telehealth: Payer: Self-pay

## 2016-08-15 NOTE — Telephone Encounter (Signed)
Called and left a message with a new appt as dr Alvy Bimler will be out of the office  Tyler Wu

## 2016-09-11 ENCOUNTER — Ambulatory Visit (INDEPENDENT_AMBULATORY_CARE_PROVIDER_SITE_OTHER): Payer: Medicare HMO | Admitting: Physician Assistant

## 2016-09-11 ENCOUNTER — Encounter: Payer: Self-pay | Admitting: Physician Assistant

## 2016-09-11 VITALS — BP 135/83 | HR 64 | Resp 16 | Ht 67.0 in | Wt 166.8 lb

## 2016-09-11 DIAGNOSIS — C9190 Lymphoid leukemia, unspecified not having achieved remission: Secondary | ICD-10-CM

## 2016-09-11 DIAGNOSIS — H8110 Benign paroxysmal vertigo, unspecified ear: Secondary | ICD-10-CM

## 2016-09-11 DIAGNOSIS — C9110 Chronic lymphocytic leukemia of B-cell type not having achieved remission: Secondary | ICD-10-CM

## 2016-09-11 DIAGNOSIS — R42 Dizziness and giddiness: Secondary | ICD-10-CM

## 2016-09-11 LAB — POCT CBC
Granulocyte percent: 18.9 %G — AB (ref 37–80)
HCT, POC: 44.3 % (ref 43.5–53.7)
HEMOGLOBIN: 15.1 g/dL (ref 14.1–18.1)
LYMPH, POC: 31.9 — AB (ref 0.6–3.4)
MCH, POC: 28.7 pg (ref 27–31.2)
MCHC: 34 g/dL (ref 31.8–35.4)
MCV: 84.4 fL (ref 80–97)
MID (cbc): 1.4 — AB (ref 0–0.9)
MPV: 7.7 fL (ref 0–99.8)
PLATELET COUNT, POC: 189 10*3/uL (ref 142–424)
POC GRANULOCYTE: 7.7 — AB (ref 2–6.9)
POC LYMPH PERCENT: 77.8 %L — AB (ref 10–50)
POC MID %: 3.3 % (ref 0–12)
RBC: 5.25 M/uL (ref 4.69–6.13)
RDW, POC: 15.2 %
WBC: 41 10*3/uL — AB (ref 4.6–10.2)

## 2016-09-11 LAB — POCT URINALYSIS DIP (MANUAL ENTRY)
Bilirubin, UA: NEGATIVE
Glucose, UA: NEGATIVE mg/dL
Ketones, POC UA: NEGATIVE mg/dL
Leukocytes, UA: NEGATIVE
Nitrite, UA: NEGATIVE
Protein Ur, POC: NEGATIVE mg/dL
Spec Grav, UA: <=1.005 — AB (ref 1.010–1.025)
Urobilinogen, UA: 0.2 E.U./dL
pH, UA: 7 (ref 5.0–8.0)

## 2016-09-11 MED ORDER — ONDANSETRON HCL 4 MG PO TABS: 4.0000 mg | ORAL_TABLET | Freq: Three times a day (TID) | ORAL | 0 refills | Status: DC | PRN

## 2016-09-11 MED ORDER — MECLIZINE HCL 25 MG PO TABS: 25.0000 mg | ORAL_TABLET | Freq: Three times a day (TID) | ORAL | 0 refills | Status: DC | PRN

## 2016-09-11 NOTE — Progress Notes (Signed)
09/14/2016 8:52 AM   DOB: [birth year] / MRN: [id number]  SUBJECTIVE:  Tyler Wu is a 71 y.o. male presenting for dizziness that is worse with head movement.  Has a very difficult time describing the dizziness. Has a history of BPPV.  Has not tried anything for this yet but has some meclizine at home.  Thought it may be due to his BP. Has a history of CLL and is followed by heme.       He is allergic to levofloxacin.   He  has a past medical history of Allergy and CLL (chronic lymphocytic leukemia) ([place]) (2005).    He  reports that he quit smoking about 42 years ago. His smoking use included Cigarettes. He has never used smokeless tobacco. He reports that he drinks alcohol. He reports that he does not use drugs. He  reports that he currently engages in sexual activity. He reports using the following method of birth control/protection: None. The patient  has a past surgical history that includes Hernia repair (1996).  His family history includes Cancer (age of onset: 47) in his father; Heart attack in his maternal grandfather and maternal grandmother; Hypertension in his maternal grandfather and maternal grandmother.  Review of Systems  Constitutional: Negative for chills, diaphoresis and fever.  HENT: Negative for sore throat and tinnitus.   Respiratory: Negative for cough, hemoptysis, sputum production, shortness of breath and wheezing.   Cardiovascular: Negative for chest pain, orthopnea and leg swelling.  Gastrointestinal: Negative for nausea.  Skin: Negative for rash.  Neurological: Positive for dizziness and headaches. Negative for tingling, tremors, sensory change, speech change, focal weakness, seizures and loss of consciousness.  Psychiatric/Behavioral: Negative for depression.    The problem list and medications were reviewed and updated by myself where necessary and exist elsewhere in the encounter.   OBJECTIVE:  BP 135/83 (BP Location: Right Arm, Patient Position:  Standing, Cuff Size: Large)   Pulse 64   Resp 16   Ht 5\' 7"  (1.702 m)   Wt 166 lb 12.8 oz (75.7 kg)   SpO2 98%   BMI 26.12 kg/m   Physical Exam  Constitutional: He is oriented to person, place, and time. He appears well-developed. He is active and cooperative.  Non-toxic appearance.  Cardiovascular: Normal rate, regular rhythm, S1 normal, S2 normal, normal heart sounds, intact distal pulses and normal pulses.  Exam reveals no gallop and no friction rub.   No murmur heard. Pulmonary/Chest: Effort normal. No stridor. No tachypnea. No respiratory distress. He has no wheezes. He has no rales.  Abdominal: He exhibits no distension.  Musculoskeletal: He exhibits no edema.  Neurological: He is alert and oriented to person, place, and time. He has normal reflexes. He displays normal reflexes. No cranial nerve deficit. He exhibits normal muscle tone. Coordination normal.  Skin: Skin is warm and dry. He is not diaphoretic. No pallor.  Vitals reviewed.   Results for orders placed or performed in visit on 09/11/16 (from the past 72 hour(s))  POCT urinalysis dipstick     Status: Abnormal   Collection Time: 09/11/16  9:44 AM  Result Value Ref Range   Color, UA yellow yellow   Clarity, UA clear clear   Glucose, UA negative negative mg/dL   Bilirubin, UA negative negative   Ketones, POC UA negative negative mg/dL   Spec Grav, UA <=1.005 (A) 1.010 - 1.025   Blood, UA trace-lysed (A) negative   pH, UA 7.0 5.0 - 8.0   Protein  Ur, POC negative negative mg/dL   Urobilinogen, UA 0.2 0.2 or 1.0 E.U./dL   Nitrite, UA Negative Negative   Leukocytes, UA Negative Negative  POCT CBC     Status: Abnormal   Collection Time: 09/11/16 10:32 AM  Result Value Ref Range   WBC 41.0 (A) 4.6 - 10.2 K/uL   Lymph, poc 31.9 (A) 0.6 - 3.4   POC LYMPH PERCENT 77.8 (A) 10 - 50 %L   MID (cbc) 1.4 (A) 0 - 0.9   POC MID % 3.3 0 - 12 %M   POC Granulocyte 7.7 (A) 2 - 6.9   Granulocyte percent 18.9 (A) 37 - 80 %G   RBC  5.25 4.69 - 6.13 M/uL   Hemoglobin 15.1 14.1 - 18.1 g/dL   HCT, POC 44.3 43.5 - 53.7 %   MCV 84.4 80 - 97 fL   MCH, POC 28.7 27 - 31.2 pg   MCHC 34.0 31.8 - 35.4 g/dL   RDW, POC 15.2 %   Platelet Count, POC 189 142 - 424 K/uL   MPV 7.7 0 - 99.8 fL    No results found.  ASSESSMENT AND PLAN:  Tyler Wu was seen today for dizziness.  Diagnoses and all orders for this visit:  Dizziness: Likely secondary to problem 3.  Neurologically intact.  Will recheck CBC in one week lab only.  Per his previous measures today's total white count is within on standard deviation for him.   -     POCT urinalysis dipstick -     meclizine (ANTIVERT) 25 MG tablet; Take 1 tablet (25 mg total) by mouth 3 (three) times daily as needed for dizziness. -     ondansetron (ZOFRAN) 4 MG tablet; Take 1 tablet (4 mg total) by mouth every 8 (eight) hours as needed for nausea or vomiting.  CLL (chronic lymphocytic leukemia) (H[facility]) -     POCT CBC -     CBC with Differential; Future  Benign paroxysmal positional vertigo, unspecified laterality    The patient is advised to call or return to clinic if he does not see an improvement in symptoms, or to seek the care of the closest emergency department if he worsens with the above plan.   Tyler Wu, MHS, PA-C [facility] 09/14/2016 8:52 AM

## 2016-09-11 NOTE — Patient Instructions (Addendum)
  CBC Latest Ref Rng & Units 09/11/2016 11/21/2015 03/14/2015  WBC 4.6 - 10.2 K/uL 41.0(A) 35.4(H) 27.5(H)  Hemoglobin 14.1 - 18.1 g/dL 15.1 14.7 15.7  Hematocrit 43.5 - 53.7 % 44.3 44.9 46.2  Platelets 140 - 400 10e3/uL - 183 182      IF you received an x-ray today, you will receive an invoice from [facility]. Please contact [facility] at [phone] with questions or concerns regarding your invoice.   IF you received labwork today, you will receive an invoice from [facility]. Please contact [facility] at [phone] with questions or concerns regarding your invoice.   Our billing staff will not be able to assist you with questions regarding bills from these companies.  You will be contacted with the lab results as soon as they are available. The fastest way to get your results is to activate your My Chart account. Instructions are located on the last page of this paperwork. If you have not heard from [place] regarding the results in 2 weeks, please contact this office.

## 2016-09-18 ENCOUNTER — Ambulatory Visit (INDEPENDENT_AMBULATORY_CARE_PROVIDER_SITE_OTHER): Payer: Medicare HMO | Admitting: Physician Assistant

## 2016-09-18 DIAGNOSIS — C9110 Chronic lymphocytic leukemia of B-cell type not having achieved remission: Secondary | ICD-10-CM

## 2016-09-18 DIAGNOSIS — C9190 Lymphoid leukemia, unspecified not having achieved remission: Secondary | ICD-10-CM

## 2016-09-19 LAB — CBC WITH DIFFERENTIAL/PLATELET
BASOS ABS: 0.1 10*3/uL (ref 0.0–0.2)
BASOS: 0 %
EOS (ABSOLUTE): 0.2 10*3/uL (ref 0.0–0.4)
Eos: 0 %
Hematocrit: 44.8 % (ref 37.5–51.0)
Hemoglobin: 15.2 g/dL (ref 13.0–17.7)
IMMATURE GRANS (ABS): 0 10*3/uL (ref 0.0–0.1)
Immature Granulocytes: 0 %
LYMPHS: 85 %
Lymphocytes Absolute: 34.6 10*3/uL — ABNORMAL HIGH (ref 0.7–3.1)
MCH: 29.8 pg (ref 26.6–33.0)
MCHC: 33.9 g/dL (ref 31.5–35.7)
MCV: 88 fL (ref 79–97)
MONOCYTES: 2 %
Monocytes Absolute: 1 10*3/uL — ABNORMAL HIGH (ref 0.1–0.9)
NEUTROS ABS: 5.2 10*3/uL (ref 1.4–7.0)
Neutrophils: 13 %
PLATELETS: 195 10*3/uL (ref 150–379)
RBC: 5.1 x10E6/uL (ref 4.14–5.80)
RDW: 15.1 % (ref 12.3–15.4)
WBC: 41.1 10*3/uL (ref 3.4–10.8)

## 2016-11-19 ENCOUNTER — Other Ambulatory Visit: Payer: Medicare Other

## 2016-11-19 ENCOUNTER — Ambulatory Visit: Payer: Medicare Other | Admitting: Hematology and Oncology

## 2016-11-24 ENCOUNTER — Telehealth: Payer: Self-pay

## 2016-11-24 ENCOUNTER — Other Ambulatory Visit (HOSPITAL_BASED_OUTPATIENT_CLINIC_OR_DEPARTMENT_OTHER): Payer: Commercial Managed Care - HMO

## 2016-11-24 ENCOUNTER — Ambulatory Visit (HOSPITAL_BASED_OUTPATIENT_CLINIC_OR_DEPARTMENT_OTHER): Payer: Commercial Managed Care - HMO | Admitting: Hematology and Oncology

## 2016-11-24 ENCOUNTER — Encounter: Payer: Self-pay | Admitting: Hematology and Oncology

## 2016-11-24 DIAGNOSIS — C9110 Chronic lymphocytic leukemia of B-cell type not having achieved remission: Secondary | ICD-10-CM

## 2016-11-24 DIAGNOSIS — Z85828 Personal history of other malignant neoplasm of skin: Secondary | ICD-10-CM | POA: Insufficient documentation

## 2016-11-24 LAB — TECHNOLOGIST REVIEW

## 2016-11-24 LAB — CBC WITH DIFFERENTIAL/PLATELET
BASO%: 0.2 % (ref 0.0–2.0)
BASOS ABS: 0.1 10*3/uL (ref 0.0–0.1)
EOS ABS: 0.2 10*3/uL (ref 0.0–0.5)
EOS%: 0.4 % (ref 0.0–7.0)
HCT: 47.6 % (ref 38.4–49.9)
HEMOGLOBIN: 15.4 g/dL (ref 13.0–17.1)
LYMPH#: 32.6 10*3/uL — AB (ref 0.9–3.3)
LYMPH%: 84.3 % — ABNORMAL HIGH (ref 14.0–49.0)
MCH: 29.1 pg (ref 27.2–33.4)
MCHC: 32.4 g/dL (ref 32.0–36.0)
MCV: 89.8 fL (ref 79.3–98.0)
MONO#: 0.8 10*3/uL (ref 0.1–0.9)
MONO%: 2.1 % (ref 0.0–14.0)
NEUT#: 5 10*3/uL (ref 1.5–6.5)
NEUT%: 13 % — ABNORMAL LOW (ref 39.0–75.0)
NRBC: 0 % (ref 0–0)
PLATELETS: 189 10*3/uL (ref 140–400)
RBC: 5.3 10*6/uL (ref 4.20–5.82)
RDW: 14.3 % (ref 11.0–14.6)
WBC: 38.7 10*3/uL — ABNORMAL HIGH (ref 4.0–10.3)

## 2016-11-24 NOTE — Assessment & Plan Note (Signed)
He follows closely with dermatologist °

## 2016-11-24 NOTE — Progress Notes (Signed)
[place] OFFICE PROGRESS NOTE  Patient Care Team: Jaynee Eagles, PA-C as PCP - General (Urgent Care) Heath Lark, MD as Consulting Physician (Hematology and Oncology)  SUMMARY OF ONCOLOGIC HISTORY:  Tyler Wu was transferred to my care after his prior physician has left.  I reviewed the patient's records extensive and collaborated the history with the patient. Summary of his history is as follows: He presented with an episode of severe pharyngitis back in March of 2005 and was found to have leukocytosis with absolute lymphocytosis. Diagnosis of chronic lymphocytic leukemia, RAI stage 0 was made based on typical CBC findings and peripheral blood flow cytometry. He initial total his white count was about 16,000 at that time. White count has slowly drifted up into the 30-50,000 range with 80% to 90% lymphocytes but has been overall stable at this level for at least the last 7 years recorded in the computer since August of 2007. He has not developed anemia or thrombocytopenia. He has not had any history of recurrent or severe infections. He has never had a herpes zoster infection. Total serum immunoglobulins run in the low normal range with IgM consistently low. No monoclonal proteins on IFE. No signs of any paraneoplastic hemolytic anemia with normal reticulocyte count and negative Coombs test. His only new complaint today is related to multiple skin nodules. 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 He underwent excision of the lump in November 2015 and it came back benign  INTERVAL HISTORY: Please see below for problem oriented charting. He returns for further follow-up He denies recent infection No new lymphadenopathy He follows very closely with dermatologist and had recent minor skin procedure for early stage skin cancer on his face He continues to work Denies recent anorexia, weight loss or night  sweats He is up to date with his vaccination program  REVIEW OF SYSTEMS:   Constitutional: Denies fevers, chills or abnormal weight loss Eyes: Denies blurriness of vision Ears, nose, mouth, throat, and face: Denies mucositis or sore throat Respiratory: Denies cough, dyspnea or wheezes Cardiovascular: Denies palpitation, chest discomfort or lower extremity swelling Gastrointestinal:  Denies nausea, heartburn or change in bowel habits Lymphatics: Denies new lymphadenopathy or easy bruising Neurological:Denies numbness, tingling or new weaknesses Behavioral/Psych: Mood is stable, no new changes  All other systems were reviewed with the patient and are negative.  I have reviewed the past medical history, past surgical history, social history and family history with the patient and they are unchanged from previous note.  ALLERGIES:  is allergic to levofloxacin.  MEDICATIONS:  Current Outpatient Prescriptions  Medication Sig Dispense Refill  . aspirin 81 MG tablet Take 81 mg by mouth daily.    Marland Kitchen loratadine (CLARITIN) 10 MG tablet Take 10 mg by mouth daily.    . meclizine (ANTIVERT) 25 MG tablet Take 1 tablet (25 mg total) by mouth 3 (three) times daily as needed for dizziness. 30 tablet 0  . ondansetron (ZOFRAN) 4 MG tablet Take 1 tablet (4 mg total) by mouth every 8 (eight) hours as needed for nausea or vomiting. 20 tablet 0   No current facility-administered medications for this visit.     PHYSICAL EXAMINATION: ECOG PERFORMANCE STATUS: 0 - Asymptomatic  Vitals:   11/24/16 1211  BP: (!) 150/76  Pulse: 63  Resp: 20  Temp: 98.1 F (36.7 C)  SpO2: 100%   Filed Weights   11/24/16 1211  Weight:  167 lb 1.6 oz (75.8 kg)    GENERAL:alert, no distress and comfortable SKIN: skin color, texture, turgor are normal, no rashes or significant lesions EYES: normal, Conjunctiva are pink and non-injected, sclera clear OROPHARYNX:no exudate, no erythema and lips, buccal mucosa, and tongue  normal  NECK: supple, thyroid normal size, non-tender, without nodularity LYMPH:  no palpable lymphadenopathy in the cervical, axillary or inguinal LUNGS: clear to auscultation and percussion with normal breathing effort HEART: regular rate & rhythm and no murmurs and no lower extremity edema ABDOMEN:abdomen soft, non-tender and normal bowel sounds Musculoskeletal:no cyanosis of digits and no clubbing  NEURO: alert & oriented x 3 with fluent speech, no focal motor/sensory deficits  LABORATORY DATA:  I have reviewed the data as listed    Component Value Date/Time   NA 142 03/19/2016 1008   NA 140 11/22/2014 1320   K 4.6 03/19/2016 1008   K 3.8 11/22/2014 1320   CL 102 03/19/2016 1008   CL 107 11/20/2011 1011   CO2 24 03/19/2016 1008   CO2 24 11/22/2014 1320   GLUCOSE 92 03/19/2016 1008   GLUCOSE 93 03/14/2015 0841   GLUCOSE 154 (H) 11/22/2014 1320   GLUCOSE 87 11/20/2011 1011   BUN 13 03/19/2016 1008   BUN 10.5 11/22/2014 1320   CREATININE 1.27 03/19/2016 1008   CREATININE 1.29 (H) 03/14/2015 0841   CREATININE 1.3 11/22/2014 1320   CALCIUM 9.3 03/19/2016 1008   CALCIUM 9.1 11/22/2014 1320   PROT 6.6 03/19/2016 1008   PROT 6.4 11/22/2014 1320   ALBUMIN 4.8 03/19/2016 1008   ALBUMIN 4.1 11/22/2014 1320   AST 15 03/19/2016 1008   AST 13 11/22/2014 1320   ALT 16 03/19/2016 1008   ALT 14 11/22/2014 1320   ALKPHOS 64 03/19/2016 1008   ALKPHOS 67 11/22/2014 1320   BILITOT 0.5 03/19/2016 1008   BILITOT 0.64 11/22/2014 1320   GFRNONAA 57 (L) 03/19/2016 1008   GFRAA 66 03/19/2016 1008    No results found for: SPEP, UPEP  Lab Results  Component Value Date   WBC 38.7 (H) 11/24/2016   NEUTROABS 5.0 11/24/2016   HGB 15.4 11/24/2016   HCT 47.6 11/24/2016   MCV 89.8 11/24/2016   PLT 189 11/24/2016      Chemistry      Component Value Date/Time   NA 142 03/19/2016 1008   NA 140 11/22/2014 1320   K 4.6 03/19/2016 1008   K 3.8 11/22/2014 1320   CL 102 03/19/2016 1008    CL 107 11/20/2011 1011   CO2 24 03/19/2016 1008   CO2 24 11/22/2014 1320   BUN 13 03/19/2016 1008   BUN 10.5 11/22/2014 1320   CREATININE 1.27 03/19/2016 1008   CREATININE 1.29 (H) 03/14/2015 0841   CREATININE 1.3 11/22/2014 1320      Component Value Date/Time   CALCIUM 9.3 03/19/2016 1008   CALCIUM 9.1 11/22/2014 1320   ALKPHOS 64 03/19/2016 1008   ALKPHOS 67 11/22/2014 1320   AST 15 03/19/2016 1008   AST 13 11/22/2014 1320   ALT 16 03/19/2016 1008   ALT 14 11/22/2014 1320   BILITOT 0.5 03/19/2016 1008   BILITOT 0.64 11/22/2014 1320      ASSESSMENT & PLAN:  CLL (chronic lymphocytic leukemia) Clinically, his blood work appears stable. I educated the patient signs and symptoms to watch out for disease progression I will continue his yearly followup for CLL with history, physical examination and blood work.  History of skin cancer He follows closely  with dermatologist   No orders of the defined types were placed in this encounter.  All questions were answered. The patient knows to call the clinic with any problems, questions or concerns. No barriers to learning was detected. I spent 10 minutes counseling the patient face to face. The total time spent in the appointment was 15 minutes and more than 50% was on counseling and review of test results     Heath Lark, MD 11/24/2016 2:50 PM

## 2016-11-24 NOTE — Assessment & Plan Note (Signed)
Clinically, his blood work appears stable. I educated the patient signs and symptoms to watch out for disease progression I will continue his yearly followup for CLL with history, physical examination and blood work.

## 2016-11-24 NOTE — Telephone Encounter (Signed)
Printed avs and calender for upcoming appointments in October 2019.per 10/30 los

## 2017-02-15 ENCOUNTER — Ambulatory Visit (INDEPENDENT_AMBULATORY_CARE_PROVIDER_SITE_OTHER): Payer: Medicare HMO | Admitting: Urgent Care

## 2017-02-15 ENCOUNTER — Encounter: Payer: Self-pay | Admitting: Urgent Care

## 2017-02-15 VITALS — BP 147/69 | HR 84 | Temp 98.3°F | Resp 18 | Ht 67.0 in | Wt 170.8 lb

## 2017-02-15 DIAGNOSIS — R030 Elevated blood-pressure reading, without diagnosis of hypertension: Secondary | ICD-10-CM | POA: Diagnosis not present

## 2017-02-15 DIAGNOSIS — J069 Acute upper respiratory infection, unspecified: Secondary | ICD-10-CM

## 2017-02-15 DIAGNOSIS — B9789 Other viral agents as the cause of diseases classified elsewhere: Secondary | ICD-10-CM | POA: Diagnosis not present

## 2017-02-15 DIAGNOSIS — C9190 Lymphoid leukemia, unspecified not having achieved remission: Secondary | ICD-10-CM

## 2017-02-15 DIAGNOSIS — C9110 Chronic lymphocytic leukemia of B-cell type not having achieved remission: Secondary | ICD-10-CM

## 2017-02-15 MED ORDER — BENZONATATE 100 MG PO CAPS: 100.0000 mg | ORAL_CAPSULE | Freq: Three times a day (TID) | ORAL | 0 refills | Status: DC | PRN

## 2017-02-15 MED ORDER — HYDROCODONE-HOMATROPINE 5-1.5 MG/5ML PO SYRP: 5.0000 mL | ORAL_SOLUTION | Freq: Every evening | ORAL | 0 refills | Status: DC | PRN

## 2017-02-15 NOTE — Progress Notes (Signed)
  MRN: [id number] DOB: 1945/11/23  Subjective:   Tyler Wu is a 72 y.o. male presenting for 4 day history of worsening nasal congestion, post-nasal drainage, sore throat, productive cough that elicits upper abdominal pain. Did receive flu shot this past season. Had exposure to some co-workers with the flu this past week. Has tried Mucinex with some relief. Denies fever, sinus pain, ear pain, ear drainage, chest pain, shob, wheezing, n/v, rashes. Denies smoking cigarettes. Denies history of asthma. Has CLL, last cbc was 38.7 and had last check up with Dr. Alvy Bimler 11/24/2016.  Tyler Wu is not currently taking any medications and is allergic to levofloxacin.  Tyler Wu  has a past medical history of Allergy and CLL (chronic lymphocytic leukemia) ([place]) (2005). Also  has a past surgical history that includes Hernia repair (1996).  Objective:   Vitals: BP (!) 147/69   Pulse 84   Temp 98.3 F (36.8 C) (Oral)   Resp 18   Ht 5\' 7"  (1.702 m)   Wt 170 lb 12.8 oz (77.5 kg)   SpO2 97%   BMI 26.75 kg/m   BP Readings from Last 3 Encounters:  02/15/17 (!) 147/69  11/24/16 (!) 150/76  09/11/16 135/83   Physical Exam  Constitutional: He is oriented to person, place, and time. He appears well-developed and well-nourished.  HENT:  TM's intact bilaterally, no effusions or erythema. Nasal turbinates pink, moist, nasal passages patent. No sinus tenderness. Oropharynx with moderate post-nasal drainage, mucous membranes moist.   Eyes: Right eye exhibits no discharge. Left eye exhibits no discharge.  Neck: Normal range of motion. Neck supple.  Cardiovascular: Normal rate, regular rhythm and intact distal pulses. Exam reveals no gallop and no friction rub.  No murmur heard. Pulmonary/Chest: No respiratory distress. He has no wheezes. He has no rales.  Lymphadenopathy:    He has no cervical adenopathy.  Neurological: He is alert and oriented to person, place, and time.  Skin: Skin is warm and dry.    Assessment and Plan :   Viral URI with cough  Elevated blood pressure reading  CLL (chronic lymphocytic leukemia) ([place])  Start supportive care for viral illness. Offered chest x-ray but patient declined. Return-to-clinic precautions discussed, patient verbalized understanding. Regarding BP, will make dietary modifications. Recheck at [facility] or annual physical.   Jaynee Eagles, PA-C [facility] [phone] 02/15/2017  11:26 AM

## 2017-02-15 NOTE — Patient Instructions (Addendum)
For sore throat try using a honey-based tea. Use 3 teaspoons of honey with juice squeezed from half lemon. Place shaved pieces of ginger into 1/2-1 cup of water and warm over stove top. Then mix the ingredients and repeat every 4 hours as needed.    Upper Respiratory Infection, Adult Most upper respiratory infections (URIs) are caused by a virus. A URI affects the nose, throat, and upper air passages. The most common type of URI is often called "the common cold." Follow these instructions at home:  Take medicines only as told by your doctor.  Gargle warm saltwater or take cough drops to comfort your throat as told by your doctor.  Use a warm mist humidifier or inhale steam from a shower to increase air moisture. This may make it easier to breathe.  Drink enough fluid to keep your pee (urine) clear or pale yellow.  Eat soups and other clear broths.  Have a healthy diet.  Rest as needed.  Go back to work when your fever is gone or your doctor says it is okay. ? You may need to stay home longer to avoid giving your URI to others. ? You can also wear a face mask and wash your hands often to prevent spread of the virus.  Use your inhaler more if you have asthma.  Do not use any tobacco products, including cigarettes, chewing tobacco, or electronic cigarettes. If you need help quitting, ask your doctor. Contact a doctor if:  You are getting worse, not better.  Your symptoms are not helped by medicine.  You have chills.  You are getting more short of breath.  You have brown or red mucus.  You have yellow or brown discharge from your nose.  You have pain in your face, especially when you bend forward.  You have a fever.  You have puffy (swollen) neck glands.  You have pain while swallowing.  You have white areas in the back of your throat. Get help right away if:  You have very bad or constant: ? Headache. ? Ear pain. ? Pain in your forehead, behind your eyes, and over  your cheekbones (sinus pain). ? Chest pain.  You have long-lasting (chronic) lung disease and any of the following: ? Wheezing. ? Long-lasting cough. ? Coughing up blood. ? A change in your usual mucus.  You have a stiff neck.  You have changes in your: ? Vision. ? Hearing. ? Thinking. ? Mood. This information is not intended to replace advice given to you by your health care provider. Make sure you discuss any questions you have with your health care provider. Document Released: 07/01/2007 Document Revised: 09/15/2015 Document Reviewed: 04/19/2013 Elsevier Interactive Patient Education  2018 [employer].     IF you received an x-ray today, you will receive an invoice from [facility]. Please contact [facility] at [phone] with questions or concerns regarding your invoice.   IF you received labwork today, you will receive an invoice from [facility]. Please contact [facility] at [phone] with questions or concerns regarding your invoice.   Our billing staff will not be able to assist you with questions regarding bills from these companies.  You will be contacted with the lab results as soon as they are available. The fastest way to get your results is to activate your My Chart account. Instructions are located on the last page of this paperwork. If you have not heard from [place] regarding the results in 2 weeks, please contact this office.

## 2017-03-02 ENCOUNTER — Encounter: Payer: Self-pay | Admitting: Urgent Care

## 2017-03-02 ENCOUNTER — Ambulatory Visit (INDEPENDENT_AMBULATORY_CARE_PROVIDER_SITE_OTHER): Payer: Medicare HMO | Admitting: Urgent Care

## 2017-03-02 VITALS — BP 140/78 | HR 71 | Temp 98.0°F | Resp 18 | Ht 67.0 in | Wt 170.4 lb

## 2017-03-02 DIAGNOSIS — J069 Acute upper respiratory infection, unspecified: Secondary | ICD-10-CM | POA: Diagnosis not present

## 2017-03-02 DIAGNOSIS — I10 Essential (primary) hypertension: Secondary | ICD-10-CM

## 2017-03-02 DIAGNOSIS — C9110 Chronic lymphocytic leukemia of B-cell type not having achieved remission: Secondary | ICD-10-CM

## 2017-03-02 DIAGNOSIS — B9789 Other viral agents as the cause of diseases classified elsewhere: Secondary | ICD-10-CM

## 2017-03-02 DIAGNOSIS — C9190 Lymphoid leukemia, unspecified not having achieved remission: Secondary | ICD-10-CM | POA: Diagnosis not present

## 2017-03-02 DIAGNOSIS — J019 Acute sinusitis, unspecified: Secondary | ICD-10-CM | POA: Diagnosis not present

## 2017-03-02 DIAGNOSIS — R590 Localized enlarged lymph nodes: Secondary | ICD-10-CM

## 2017-03-02 MED ORDER — AMOXICILLIN 500 MG PO CAPS: 500.0000 mg | ORAL_CAPSULE | Freq: Three times a day (TID) | ORAL | 0 refills | Status: DC

## 2017-03-02 NOTE — Progress Notes (Signed)
    MRN: [id number] DOB: [birth year]  Subjective:   Tyler Wu is a 72 y.o. male presenting for follow up on viral URI with cough, Hypertension. Still has post-nasal drainage, cough, sinus headaches, now having left sided neck discomfort, left ear pain. Denies fever, ear drainage, chest pain, shob, n/v, abdominal pain. Regarding HTN, patient does not avoid salt. He is not on medications for this now. Denies smoking cigarettes.   Xian has a current medication list which includes the following prescription(s): benzonatate. Also is allergic to levofloxacin.  Domnique  has a past medical history of Allergy and CLL (chronic lymphocytic leukemia) ([place]) (2005). Also  has a past surgical history that includes Hernia repair (1996).  Objective:   Vitals: BP 140/78   Pulse 71   Temp 98 F (36.7 C) (Oral)   Resp 18   Ht 5\' 7"  (1.702 m)   Wt 170 lb 6.4 oz (77.3 kg)   SpO2 98%   BMI 26.69 kg/m   BP Readings from Last 3 Encounters:  03/02/17 140/78  02/15/17 (!) 147/69  11/24/16 (!) 150/76    Physical Exam  Constitutional: He is oriented to person, place, and time. He appears well-developed and well-nourished.  HENT:  TM's intact bilaterally, no effusions or erythema. Nasal turbinates dry, erythematous, nasal passages patent. No sinus tenderness. Oropharynx with moderate post-nasal drainage, mucous membranes moist.  Eyes: Right eye exhibits no discharge. Left eye exhibits no discharge.  Neck: Normal range of motion. Neck supple.  Cardiovascular: Normal rate, regular rhythm and intact distal pulses. Exam reveals no gallop and no friction rub.  No murmur heard. Pulmonary/Chest: No respiratory distress. He has no wheezes. He has no rales.  Lymphadenopathy:    He has cervical adenopathy (left, upper, anterior).  Neurological: He is alert and oriented to person, place, and time.  Skin: Skin is warm and dry.  Psychiatric: He has a normal mood and affect.   Assessment and Plan :   Acute  non-recurrent sinusitis, unspecified location - Plan: CBC with Differential/Platelet  Cervical lymphadenopathy - Plan: CBC with Differential/Platelet  Viral URI with cough  CLL (chronic lymphocytic leukemia) ([place]) - Plan: CBC with Differential/Platelet  Essential hypertension  Will start amoxicillin for sinusitis, cbc for recheck on CLL given lymphadenopathy. Patient refused BP medications for now, will make dietary modifications, recheck in 4 weeks.  Jaynee Eagles, PA-C [facility] at [place] [phone] 03/02/2017  9:49 AM

## 2017-03-02 NOTE — Patient Instructions (Addendum)
Sinusitis, Adult Sinusitis is soreness and inflammation of your sinuses. Sinuses are hollow spaces in the bones around your face. Your sinuses are located:  Around your eyes.  In the middle of your forehead.  Behind your nose.  In your cheekbones.  Your sinuses and nasal passages are lined with a stringy fluid (mucus). Mucus normally drains out of your sinuses. When your nasal tissues become inflamed or swollen, the mucus can become trapped or blocked so air cannot flow through your sinuses. This allows bacteria, viruses, and funguses to grow, which leads to infection. Sinusitis can develop quickly and last for 7?10 days (acute) or for more than 12 weeks (chronic). Sinusitis often develops after a cold. What are the causes? This condition is caused by anything that creates swelling in the sinuses or stops mucus from draining, including:  Allergies.  Asthma.  Bacterial or viral infection.  Abnormally shaped bones between the nasal passages.  Nasal growths that contain mucus (nasal polyps).  Narrow sinus openings.  Pollutants, such as chemicals or irritants in the air.  A foreign object stuck in the nose.  A fungal infection. This is rare.  What increases the risk? The following factors may make you more likely to develop this condition:  Having allergies or asthma.  Having had a recent cold or respiratory tract infection.  Having structural deformities or blockages in your nose or sinuses.  Having a weak immune system.  Doing a lot of swimming or diving.  Overusing nasal sprays.  Smoking.  What are the signs or symptoms? The main symptoms of this condition are pain and a feeling of pressure around the affected sinuses. Other symptoms include:  Upper toothache.  Earache.  Headache.  Bad breath.  Decreased sense of smell and taste.  A cough that may get worse at night.  Fatigue.  Fever.  Thick drainage from your nose. The drainage is often green and  it may contain pus (purulent).  Stuffy nose or congestion.  Postnasal drip. This is when extra mucus collects in the throat or back of the nose.  Swelling and warmth over the affected sinuses.  Sore throat.  Sensitivity to light.  How is this diagnosed? This condition is diagnosed based on symptoms, a medical history, and a physical exam. To find out if your condition is acute or chronic, your health care provider may:  Look in your nose for signs of nasal polyps.  Tap over the affected sinus to check for signs of infection.  View the inside of your sinuses using an imaging device that has a light attached (endoscope).  If your health care provider suspects that you have chronic sinusitis, you may also:  Be tested for allergies.  Have a sample of mucus taken from your nose (nasal culture) and checked for bacteria.  Have a mucus sample examined to see if your sinusitis is related to an allergy.  If your sinusitis does not respond to treatment and it lasts longer than 8 weeks, you may have an MRI or CT scan to check your sinuses. These scans also help to determine how severe your infection is. In rare cases, a bone biopsy may be done to rule out more serious types of fungal sinus disease. How is this treated? Treatment for sinusitis depends on the cause and whether your condition is chronic or acute. If a virus is causing your sinusitis, your symptoms will go away on their own within 10 days. You may be given medicines to relieve your symptoms,   including:  Topical nasal decongestants. They shrink swollen nasal passages and let mucus drain from your sinuses.  Antihistamines. These drugs block inflammation that is triggered by allergies. This can help to ease swelling in your nose and sinuses.  Topical nasal corticosteroids. These are nasal sprays that ease inflammation and swelling in your nose and sinuses.  Nasal saline washes. These rinses can help to get rid of thick mucus in  your nose.  If your condition is caused by bacteria, you will be given an antibiotic medicine. If your condition is caused by a fungus, you will be given an antifungal medicine. Surgery may be needed to correct underlying conditions, such as narrow nasal passages. Surgery may also be needed to remove polyps. Follow these instructions at home: Medicines  Take, use, or apply over-the-counter and prescription medicines only as told by your health care provider. These may include nasal sprays.  If you were prescribed an antibiotic medicine, take it as told by your health care provider. Do not stop taking the antibiotic even if you start to feel better. Hydrate and Humidify  Drink enough water to keep your urine clear or pale yellow. Staying hydrated will help to thin your mucus.  Use a cool mist humidifier to keep the humidity level in your home above 50%.  Inhale steam for 10-15 minutes, 3-4 times a day or as told by your health care provider. You can do this in the bathroom while a hot shower is running.  Limit your exposure to cool or dry air. Rest  Rest as much as possible.  Sleep with your head raised (elevated).  Make sure to get enough sleep each night. General instructions  Apply a warm, moist washcloth to your face 3-4 times a day or as told by your health care provider. This will help with discomfort.  Wash your hands often with soap and water to reduce your exposure to viruses and other germs. If soap and water are not available, use hand sanitizer.  Do not smoke. Avoid being around people who are smoking (secondhand smoke).  Keep all follow-up visits as told by your health care provider. This is important. Contact a health care provider if:  You have a fever.  Your symptoms get worse.  Your symptoms do not improve within 10 days. Get help right away if:  You have a severe headache.  You have persistent vomiting.  You have pain or swelling around your face or  eyes.  You have vision problems.  You develop confusion.  Your neck is stiff.  You have trouble breathing. This information is not intended to replace advice given to you by your health care provider. Make sure you discuss any questions you have with your health care provider. Document Released: 01/12/2005 Document Revised: 09/08/2015 Document Reviewed: 11/07/2014 Elsevier Interactive Patient Education  2018 [employer].     Hypertension Hypertension, commonly called high blood pressure, is when the force of blood pumping through the arteries is too strong. The arteries are the blood vessels that carry blood from the heart throughout the body. Hypertension forces the heart to work harder to pump blood and may cause arteries to become narrow or stiff. Having untreated or uncontrolled hypertension can cause heart attacks, strokes, kidney disease, and other problems. A blood pressure reading consists of a higher number over a lower number. Ideally, your blood pressure should be below 120/80. The first ("top") number is called the systolic pressure. It is a measure of the pressure in your  arteries as your heart beats. The second ("bottom") number is called the diastolic pressure. It is a measure of the pressure in your arteries as the heart relaxes. What are the causes? The cause of this condition is not known. What increases the risk? Some risk factors for high blood pressure are under your control. Others are not. Factors you can change  Smoking.  Having type 2 diabetes mellitus, high cholesterol, or both.  Not getting enough exercise or physical activity.  Being overweight.  Having too much fat, sugar, calories, or salt (sodium) in your diet.  Drinking too much alcohol. Factors that are difficult or impossible to change  Having chronic kidney disease.  Having a family history of high blood pressure.  Age. Risk increases with age.  Race. You may be at higher risk if you  are African-American.  Gender. Men are at higher risk than women before age 30. After age 26, women are at higher risk than men.  Having obstructive sleep apnea.  Stress. What are the signs or symptoms? Extremely high blood pressure (hypertensive crisis) may cause:  Headache.  Anxiety.  Shortness of breath.  Nosebleed.  Nausea and vomiting.  Severe chest pain.  Jerky movements you cannot control (seizures).  How is this diagnosed? This condition is diagnosed by measuring your blood pressure while you are seated, with your arm resting on a surface. The cuff of the blood pressure monitor will be placed directly against the skin of your upper arm at the level of your heart. It should be measured at least twice using the same arm. Certain conditions can cause a difference in blood pressure between your right and left arms. Certain factors can cause blood pressure readings to be lower or higher than normal (elevated) for a short period of time:  When your blood pressure is higher when you are in a health care provider's office than when you are at home, this is called white coat hypertension. Most people with this condition do not need medicines.  When your blood pressure is higher at home than when you are in a health care provider's office, this is called masked hypertension. Most people with this condition may need medicines to control blood pressure.  If you have a high blood pressure reading during one visit or you have normal blood pressure with other risk factors:  You may be asked to return on a different day to have your blood pressure checked again.  You may be asked to monitor your blood pressure at home for 1 week or longer.  If you are diagnosed with hypertension, you may have other blood or imaging tests to help your health care provider understand your overall risk for other conditions. How is this treated? This condition is treated by making healthy lifestyle  changes, such as eating healthy foods, exercising more, and reducing your alcohol intake. Your health care provider may prescribe medicine if lifestyle changes are not enough to get your blood pressure under control, and if:  Your systolic blood pressure is above 130.  Your diastolic blood pressure is above 80.  Your personal target blood pressure may vary depending on your medical conditions, your age, and other factors. Follow these instructions at home: Eating and drinking  Eat a diet that is high in fiber and potassium, and low in sodium, added sugar, and fat. An example eating plan is called the DASH (Dietary Approaches to Stop Hypertension) diet. To eat this way: ? Eat plenty of fresh fruits and  vegetables. Try to fill half of your plate at each meal with fruits and vegetables. ? Eat whole grains, such as whole wheat pasta, brown rice, or whole grain bread. Fill about one quarter of your plate with whole grains. ? Eat or drink low-fat dairy products, such as skim milk or low-fat yogurt. ? Avoid fatty cuts of meat, processed or cured meats, and poultry with skin. Fill about one quarter of your plate with lean proteins, such as fish, chicken without skin, beans, eggs, and tofu. ? Avoid premade and processed foods. These tend to be higher in sodium, added sugar, and fat.  Reduce your daily sodium intake. Most people with hypertension should eat less than 1,500 mg of sodium a day.  Limit alcohol intake to no more than 1 drink a day for nonpregnant women and 2 drinks a day for men. One drink equals 12 oz of beer, 5 oz of wine, or 1 oz of hard liquor. Lifestyle  Work with your health care provider to maintain a healthy body weight or to lose weight. Ask what an ideal weight is for you.  Get at least 30 minutes of exercise that causes your heart to beat faster (aerobic exercise) most days of the week. Activities may include walking, swimming, or biking.  Include exercise to strengthen your  muscles (resistance exercise), such as pilates or lifting weights, as part of your weekly exercise routine. Try to do these types of exercises for 30 minutes at least 3 days a week.  Do not use any products that contain nicotine or tobacco, such as cigarettes and e-cigarettes. If you need help quitting, ask your health care provider.  Monitor your blood pressure at home as told by your health care provider.  Keep all follow-up visits as told by your health care provider. This is important. Medicines  Take over-the-counter and prescription medicines only as told by your health care provider. Follow directions carefully. Blood pressure medicines must be taken as prescribed.  Do not skip doses of blood pressure medicine. Doing this puts you at risk for problems and can make the medicine less effective.  Ask your health care provider about side effects or reactions to medicines that you should watch for. Contact a health care provider if:  You think you are having a reaction to a medicine you are taking.  You have headaches that keep coming back (recurring).  You feel dizzy.  You have swelling in your ankles.  You have trouble with your vision. Get help right away if:  You develop a severe headache or confusion.  You have unusual weakness or numbness.  You feel faint.  You have severe pain in your chest or abdomen.  You vomit repeatedly.  You have trouble breathing. Summary  Hypertension is when the force of blood pumping through your arteries is too strong. If this condition is not controlled, it may put you at risk for serious complications.  Your personal target blood pressure may vary depending on your medical conditions, your age, and other factors. For most people, a normal blood pressure is less than 120/80.  Hypertension is treated with lifestyle changes, medicines, or a combination of both. Lifestyle changes include weight loss, eating a healthy, low-sodium diet,  exercising more, and limiting alcohol. This information is not intended to replace advice given to you by your health care provider. Make sure you discuss any questions you have with your health care provider. Document Released: 01/12/2005 Document Revised: 12/11/2015 Document Reviewed: 12/11/2015 Elsevier Interactive  Patient Education  [employer].     IF you received an x-ray today, you will receive an invoice from [facility]. Please contact [facility] at [phone] with questions or concerns regarding your invoice.   IF you received labwork today, you will receive an invoice from [facility]. Please contact [facility] at [phone] with questions or concerns regarding your invoice.   Our billing staff will not be able to assist you with questions regarding bills from these companies.  You will be contacted with the lab results as soon as they are available. The fastest way to get your results is to activate your My Chart account. Instructions are located on the last page of this paperwork. If you have not heard from [place] regarding the results in 2 weeks, please contact this office.

## 2017-03-03 ENCOUNTER — Telehealth: Payer: Self-pay | Admitting: Urgent Care

## 2017-03-03 LAB — CBC WITH DIFFERENTIAL/PLATELET
BASOS: 0 %
Basophils Absolute: 0.1 10*3/uL (ref 0.0–0.2)
EOS (ABSOLUTE): 0.2 10*3/uL (ref 0.0–0.4)
EOS: 1 %
HEMATOCRIT: 41.6 % (ref 37.5–51.0)
HEMOGLOBIN: 14.1 g/dL (ref 13.0–17.7)
IMMATURE GRANULOCYTES: 0 %
Immature Grans (Abs): 0 10*3/uL (ref 0.0–0.1)
LYMPHS: 77 %
Lymphocytes Absolute: 31.6 10*3/uL — ABNORMAL HIGH (ref 0.7–3.1)
MCH: 28.6 pg (ref 26.6–33.0)
MCHC: 33.9 g/dL (ref 31.5–35.7)
MCV: 84 fL (ref 79–97)
MONOCYTES: 3 %
Monocytes Absolute: 1.1 10*3/uL — ABNORMAL HIGH (ref 0.1–0.9)
NEUTROS ABS: 7.7 10*3/uL — AB (ref 1.4–7.0)
NEUTROS PCT: 19 %
Platelets: 256 10*3/uL (ref 150–379)
RBC: 4.93 x10E6/uL (ref 4.14–5.80)
RDW: 14.5 % (ref 12.3–15.4)
WBC: 40.9 10*3/uL (ref 3.4–10.8)

## 2017-03-03 NOTE — Telephone Encounter (Signed)
Copied from [place] [id number]. Topic: Quick Communication - See Telephone Encounter >> Mar 03, 2017  9:05 AM Valla Leaver wrote: CRM for notification. See Telephone encounter for: [facility] calling with critical result. Patient result-->  WBC 40.9   03/03/17.

## 2017-03-03 NOTE — Telephone Encounter (Signed)
Please Advise

## 2017-03-04 ENCOUNTER — Encounter: Payer: Self-pay | Admitting: Urgent Care

## 2017-03-04 NOTE — Telephone Encounter (Signed)
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 I will follow up with patient myself.

## 2017-03-05 ENCOUNTER — Encounter: Payer: Self-pay | Admitting: Urgent Care

## 2017-03-05 NOTE — Telephone Encounter (Signed)
Will monitor patient. Patient instructed to RTC if symptoms persist.

## 2017-03-13 ENCOUNTER — Ambulatory Visit (INDEPENDENT_AMBULATORY_CARE_PROVIDER_SITE_OTHER): Payer: Medicare HMO

## 2017-03-13 ENCOUNTER — Encounter: Payer: Self-pay | Admitting: Physician Assistant

## 2017-03-13 ENCOUNTER — Ambulatory Visit (INDEPENDENT_AMBULATORY_CARE_PROVIDER_SITE_OTHER): Payer: Medicare HMO | Admitting: Physician Assistant

## 2017-03-13 DIAGNOSIS — S299XXA Unspecified injury of thorax, initial encounter: Secondary | ICD-10-CM | POA: Diagnosis not present

## 2017-03-13 DIAGNOSIS — S4991XA Unspecified injury of right shoulder and upper arm, initial encounter: Secondary | ICD-10-CM | POA: Diagnosis not present

## 2017-03-13 DIAGNOSIS — M25511 Pain in right shoulder: Secondary | ICD-10-CM | POA: Diagnosis not present

## 2017-03-13 DIAGNOSIS — M546 Pain in thoracic spine: Secondary | ICD-10-CM

## 2017-03-13 DIAGNOSIS — M542 Cervicalgia: Secondary | ICD-10-CM

## 2017-03-13 DIAGNOSIS — T148XXA Other injury of unspecified body region, initial encounter: Secondary | ICD-10-CM

## 2017-03-13 DIAGNOSIS — S199XXA Unspecified injury of neck, initial encounter: Secondary | ICD-10-CM | POA: Diagnosis not present

## 2017-03-13 MED ORDER — MELOXICAM 15 MG PO TABS: 7.5000 mg | ORAL_TABLET | Freq: Every day | ORAL | 0 refills | Status: DC

## 2017-03-13 MED ORDER — CYCLOBENZAPRINE HCL 10 MG PO TABS: 5.0000 mg | ORAL_TABLET | Freq: Three times a day (TID) | ORAL | 0 refills | Status: DC | PRN
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
## 2017-03-17 ENCOUNTER — Telehealth: Payer: Self-pay | Admitting: *Deleted

## 2017-03-17 NOTE — Telephone Encounter (Signed)
Pa started  [id number]: key

## 2017-03-29 ENCOUNTER — Telehealth: Payer: Self-pay

## 2017-03-29 NOTE — Telephone Encounter (Signed)
PA Approved February 20.2019

## 2017-03-30 ENCOUNTER — Ambulatory Visit (INDEPENDENT_AMBULATORY_CARE_PROVIDER_SITE_OTHER): Payer: Medicare HMO

## 2017-03-30 ENCOUNTER — Ambulatory Visit: Payer: Medicare HMO | Admitting: Urgent Care

## 2017-03-30 ENCOUNTER — Ambulatory Visit (INDEPENDENT_AMBULATORY_CARE_PROVIDER_SITE_OTHER): Payer: Medicare HMO | Admitting: Urgent Care

## 2017-03-30 ENCOUNTER — Encounter: Payer: Self-pay | Admitting: Urgent Care

## 2017-03-30 ENCOUNTER — Other Ambulatory Visit: Payer: Self-pay

## 2017-03-30 VITALS — BP 122/78 | HR 72 | Ht 67.0 in | Wt 167.2 lb

## 2017-03-30 VITALS — BP 122/78 | HR 72 | Temp 97.5°F | Resp 16 | Ht 67.0 in | Wt 167.0 lb

## 2017-03-30 DIAGNOSIS — C9190 Lymphoid leukemia, unspecified not having achieved remission: Secondary | ICD-10-CM

## 2017-03-30 DIAGNOSIS — R590 Localized enlarged lymph nodes: Secondary | ICD-10-CM | POA: Diagnosis not present

## 2017-03-30 DIAGNOSIS — R030 Elevated blood-pressure reading, without diagnosis of hypertension: Secondary | ICD-10-CM | POA: Diagnosis not present

## 2017-03-30 DIAGNOSIS — M546 Pain in thoracic spine: Secondary | ICD-10-CM

## 2017-03-30 DIAGNOSIS — M25511 Pain in right shoulder: Secondary | ICD-10-CM | POA: Diagnosis not present

## 2017-03-30 DIAGNOSIS — Z23 Encounter for immunization: Secondary | ICD-10-CM

## 2017-03-30 DIAGNOSIS — C9110 Chronic lymphocytic leukemia of B-cell type not having achieved remission: Secondary | ICD-10-CM

## 2017-03-30 DIAGNOSIS — Z0000 Encounter for general adult medical examination without abnormal findings: Secondary | ICD-10-CM | POA: Diagnosis not present

## 2017-03-30 DIAGNOSIS — M542 Cervicalgia: Secondary | ICD-10-CM | POA: Diagnosis not present

## 2017-03-30 NOTE — Patient Instructions (Addendum)
Lymphadenopathy Lymphadenopathy refers to swollen or enlarged lymph glands, also called lymph nodes. Lymph glands are part of your body's defense (immune) system, which protects the body from infections, germs, and diseases. Lymph glands are found in many locations in your body, including the neck, underarm, and groin. Many things can cause lymph glands to become enlarged. When your immune system responds to germs, such as viruses or bacteria, infection-fighting cells and fluid build up. This causes the glands to grow in size. Usually, this is not something to worry about. The swelling and any soreness often go away without treatment. However, swollen lymph glands can also be caused by a number of diseases. Your health care provider may do various tests to help determine the cause. If the cause of your swollen lymph glands cannot be found, it is important to monitor your condition to make sure the swelling goes away. Follow these instructions at home: Watch your condition for any changes. The following actions may help to lessen any discomfort you are feeling:  Get plenty of rest.  Take medicines only as directed by your health care provider. Your health care provider may recommend over-the-counter medicines for pain.  Apply moist heat compresses to the site of swollen lymph nodes as directed by your health care provider. This can help reduce any pain.  Check your lymph nodes daily for any changes.  Keep all follow-up visits as directed by your health care provider. This is important.  Contact a health care provider if:  Your lymph nodes are still swollen after 2 weeks.  Your swelling increases or spreads to other areas.  Your lymph nodes are hard, seem fixed to the skin, or are growing rapidly.  Your skin over the lymph nodes is red and inflamed.  You have a fever.  You have chills.  You have fatigue.  You develop a sore throat.  You have abdominal pain.  You have weight  loss.  You have night sweats. Get help right away if:  You notice fluid leaking from the area of the enlarged lymph node.  You have severe pain in any area of your body.  You have chest pain.  You have shortness of breath. This information is not intended to replace advice given to you by your health care provider. Make sure you discuss any questions you have with your health care provider. Document Released: 10/22/2007 Document Revised: 06/20/2015 Document Reviewed: 08/17/2013 Elsevier Interactive Patient Education  2018 [employer]     IF you received an x-ray today, you will receive an invoice from [facility]. Please contact [facility] at [phone] with questions or concerns regarding your invoice.   IF you received labwork today, you will receive an invoice from [facility]. Please contact [facility] at [phone] with questions or concerns regarding your invoice.   Our billing staff will not be able to assist you with questions regarding bills from these companies.  You will be contacted with the lab results as soon as they are available. The fastest way to get your results is to activate your My Chart account. Instructions are located on the last page of this paperwork. If you have not heard from us regarding the results in 2 weeks, please contact this office.     
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
## 2017-03-30 NOTE — Patient Instructions (Addendum)
Tyler Wu , Thank you for taking time to come for your Medicare Wellness Visit. I appreciate your ongoing commitment to your health goals. Please review the following plan we discussed and let me know if I can assist you in the future.   Screening recommendations/referrals: Colonoscopy: up to date, next due 06/08/2019 Recommended yearly ophthalmology/optometry visit for glaucoma screening and checkup Recommended yearly dental visit for hygiene and checkup  Vaccinations: Influenza vaccine: up to date Pneumococcal vaccine: administered today  Tdap vaccine: up to date, next due 06/08/2019 Shingles vaccine: Check with your pharmacy about receiving the Shingrix vaccine   Advanced directives: Advance directive discussed with you today. I have provided a copy for you to complete at home and have notarized. Once this is complete please bring a copy in to our office so we can scan it into your chart.  Conditions/risks identified: Try to start exercising on a more consistent basis.  Next appointment: today @ 9:20 am with Tyler Wu, next AWV in 1 year   Preventive Care 65 Years and Older, Male Preventive care refers to lifestyle choices and visits with your health care provider that can promote health and wellness. What does preventive care include?  A yearly physical exam. This is also called an annual well check.  Dental exams once or twice a year.  Routine eye exams. Ask your health care provider how often you should have your eyes checked.  Personal lifestyle choices, including:  Daily care of your teeth and gums.  Regular physical activity.  Eating a healthy diet.  Avoiding tobacco and drug use.  Limiting alcohol use.  Practicing safe sex.  Taking low doses of aspirin every day.  Taking vitamin and mineral supplements as recommended by your health care provider. What happens during an annual well check? The services and screenings done by your health care provider during  your annual well check will depend on your age, overall health, lifestyle risk factors, and family history of disease. Counseling  Your health care provider may ask you questions about your:  Alcohol use.  Tobacco use.  Drug use.  Emotional well-being.  Home and relationship well-being.  Sexual activity.  Eating habits.  History of falls.  Memory and ability to understand (cognition).  Work and work Statistician. Screening  You may have the following tests or measurements:  Height, weight, and BMI.  Blood pressure.  Lipid and cholesterol levels. These may be checked every 5 years, or more frequently if you are over 52 years old.  Skin check.  Lung cancer screening. You may have this screening every year starting at age 76 if you have a 30-pack-year history of smoking and currently smoke or have quit within the past 15 years.  Fecal occult blood test (FOBT) of the stool. You may have this test every year starting at age 44.  Flexible sigmoidoscopy or colonoscopy. You may have a sigmoidoscopy every 5 years or a colonoscopy every 10 years starting at age 27.  Prostate cancer screening. Recommendations will vary depending on your family history and other risks.  Hepatitis C blood test.  Hepatitis B blood test.  Sexually transmitted disease (STD) testing.  Diabetes screening. This is done by checking your blood sugar (glucose) after you have not eaten for a while (fasting). You may have this done every 1-3 years.  Abdominal aortic aneurysm (AAA) screening. You may need this if you are a current or former smoker.  Osteoporosis. You may be screened starting at age 45 if you  are at high risk. Talk with your health care provider about your test results, treatment options, and if necessary, the need for more tests. Vaccines  Your health care provider may recommend certain vaccines, such as:  Influenza vaccine. This is recommended every year.  Tetanus, diphtheria, and  acellular pertussis (Tdap, Td) vaccine. You may need a Td booster every 10 years.  Zoster vaccine. You may need this after age 71.  Pneumococcal 13-valent conjugate (PCV13) vaccine. One dose is recommended after age 66.  Pneumococcal polysaccharide (PPSV23) vaccine. One dose is recommended after age 69. Talk to your health care provider about which screenings and vaccines you need and how often you need them. This information is not intended to replace advice given to you by your health care provider. Make sure you discuss any questions you have with your health care provider. Document Released: 02/08/2015 Document Revised: 10/02/2015 Document Reviewed: 11/13/2014 Elsevier Interactive Patient Education  2017 [place] Prevention in the Home Falls can cause injuries. They can happen to people of all ages. There are many things you can do to make your home safe and to help prevent falls. What can I do on the outside of my home?  Regularly fix the edges of walkways and driveways and fix any cracks.  Remove anything that might make you trip as you walk through a door, such as a raised step or threshold.  Trim any bushes or trees on the path to your home.  Use bright outdoor lighting.  Clear any walking paths of anything that might make someone trip, such as rocks or tools.  Regularly check to see if handrails are loose or broken. Make sure that both sides of any steps have handrails.  Any raised decks and porches should have guardrails on the edges.  Have any leaves, snow, or ice cleared regularly.  Use sand or salt on walking paths during winter.  Clean up any spills in your garage right away. This includes oil or grease spills. What can I do in the bathroom?  Use night lights.  Install grab bars by the toilet and in the tub and shower. Do not use towel bars as grab bars.  Use non-skid mats or decals in the tub or shower.  If you need to sit down in the shower, use  a plastic, non-slip stool.  Keep the floor dry. Clean up any water that spills on the floor as soon as it happens.  Remove soap buildup in the tub or shower regularly.  Attach bath mats securely with double-sided non-slip rug tape.  Do not have throw rugs and other things on the floor that can make you trip. What can I do in the bedroom?  Use night lights.  Make sure that you have a light by your bed that is easy to reach.  Do not use any sheets or blankets that are too big for your bed. They should not hang down onto the floor.  Have a firm chair that has side arms. You can use this for support while you get dressed.  Do not have throw rugs and other things on the floor that can make you trip. What can I do in the kitchen?  Clean up any spills right away.  Avoid walking on wet floors.  Keep items that you use a lot in easy-to-reach places.  If you need to reach something above you, use a strong step stool that has a grab bar.  Keep electrical cords out  of the way.  Do not use floor polish or wax that makes floors slippery. If you must use wax, use non-skid floor wax.  Do not have throw rugs and other things on the floor that can make you trip. What can I do with my stairs?  Do not leave any items on the stairs.  Make sure that there are handrails on both sides of the stairs and use them. Fix handrails that are broken or loose. Make sure that handrails are as long as the stairways.  Check any carpeting to make sure that it is firmly attached to the stairs. Fix any carpet that is loose or worn.  Avoid having throw rugs at the top or bottom of the stairs. If you do have throw rugs, attach them to the floor with carpet tape.  Make sure that you have a light switch at the top of the stairs and the bottom of the stairs. If you do not have them, ask someone to add them for you. What else can I do to help prevent falls?  Wear shoes that:  Do not have high heels.  Have  rubber bottoms.  Are comfortable and fit you well.  Are closed at the toe. Do not wear sandals.  If you use a stepladder:  Make sure that it is fully opened. Do not climb a closed stepladder.  Make sure that both sides of the stepladder are locked into place.  Ask someone to hold it for you, if possible.  Clearly mark and make sure that you can see:  Any grab bars or handrails.  First and last steps.  Where the edge of each step is.  Use tools that help you move around (mobility aids) if they are needed. These include:  Canes.  Walkers.  Scooters.  Crutches.  Turn on the lights when you go into a dark area. Replace any light bulbs as soon as they burn out.  Set up your furniture so you have a clear path. Avoid moving your furniture around.  If any of your floors are uneven, fix them.  If there are any pets around you, be aware of where they are.  Review your medicines with your doctor. Some medicines can make you feel dizzy. This can increase your chance of falling. Ask your doctor what other things that you can do to help prevent falls. This information is not intended to replace advice given to you by your health care provider. Make sure you discuss any questions you have with your health care provider. Document Released: 11/08/2008 Document Revised: 06/20/2015 Document Reviewed: 02/16/2014 Elsevier Interactive Patient Education  2017 [employer].

## 2017-03-30 NOTE — Progress Notes (Signed)
Subjective:   Tyler Wu is a 72 y.o. male who presents for Medicare Annual/Subsequent preventive examination.  Review of Systems:  N/A Cardiac Risk Factors include: advanced age (>5men, >58 women);male gender     Objective:    Vitals: BP 122/78   Pulse 72   Ht 5\' 7"  (1.702 m)   Wt 167 lb 4 oz (75.9 kg)   SpO2 100%   BMI 26.20 kg/m   Body mass index is 26.2 kg/m.  Advanced Directives 03/30/2017 11/21/2015 11/22/2014  Does Patient Have a Medical Advance Directive? Yes No No  Does patient want to make changes to medical advance directive? Yes (MAU/Ambulatory/Procedural Areas - Information given) - -  Would patient like information on creating a medical advance directive? - No - patient declined information No - patient declined information    Tobacco Social History   Tobacco Use  Smoking Status Former Smoker  . Types: Cigarettes  . Last attempt to quit: 01/26/1974  . Years since quitting: 43.2  Smokeless Tobacco Never Used     Counseling given: Not Answered   Clinical Intake:  Pre-visit preparation completed: Yes  Pain : No/denies pain     Nutritional Status: BMI 25 -29 Overweight Nutritional Risks: None Diabetes: No  How often do you need to have someone help you when you read instructions, pamphlets, or other written materials from your doctor or pharmacy?: 1 - Never What is the last grade level you completed in school?: Associates Degree  Interpreter Needed?: No  Information entered by :: Andrez Grime, LPN  Past Medical History:  Diagnosis Date  . Allergy   . 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   Past Surgical History:  Procedure Laterality Date  . HERNIA REPAIR  1996   Family History  Problem Relation Age of Onset  . Cancer Father 103       melonoma  . Heart attack Maternal Grandmother   . Hypertension Maternal Grandmother   . Heart attack Maternal Grandfather   . Hypertension Maternal  Grandfather    Social History   Socioeconomic History  . Marital status: Married    Spouse name: None  . Number of children: None  . Years of education: None  . Highest education level: Associate degree: academic program  Social Needs  . Financial resource strain: Not hard at all  . Food insecurity - worry: Never true  . Food insecurity - inability: Never true  . Transportation needs - medical: No  . Transportation needs - non-medical: No  Occupational History  . Occupation: Geologist, engineering  Tobacco Use  . Smoking status: Former Smoker    Types: Cigarettes    Last attempt to quit: 01/26/1974    Years since quitting: 43.2  . Smokeless tobacco: Never Used  Substance and Sexual Activity  . Alcohol use: No    Frequency: Never  . Drug use: No  . Sexual activity: Yes    Birth control/protection: None    Comment: number of sex partners in the last 60 months  1  Other Topics Concern  . None  Social History Narrative  . None    Outpatient Encounter Medications as of 03/30/2017  Medication Sig  . aspirin 81 MG chewable tablet Chew 81 mg by mouth.  . loratadine (CLARITIN) 10 MG tablet Take 10 mg by mouth.  . [DISCONTINUED] cyclobenzaprine (FLEXERIL) 10 MG tablet Take 0.5-1 tablets (5-10 mg total) by mouth 3 (three) times daily as needed.  . [  DISCONTINUED] meloxicam (MOBIC) 15 MG tablet Take 0.5 tablets (7.5 mg total) by mouth daily.   No facility-administered encounter medications on file as of 03/30/2017.     Activities of Daily Living In your present state of health, do you have any difficulty performing the following activities: 03/30/2017  Hearing? N  Vision? N  Difficulty concentrating or making decisions? N  Walking or climbing stairs? N  Dressing or bathing? N  Doing errands, shopping? N  Preparing Food and eating ? N  Using the Toilet? N  In the past six months, have you accidently leaked urine? N  Do you have problems with loss of bowel control? N  Managing your  Medications? N  Managing your Finances? N  Housekeeping or managing your Housekeeping? N  Some recent data might be hidden    Patient Care Team: Jaynee Eagles, PA-C as PCP - General (Urgent Care) Heath Lark, MD as Consulting Physician (Hematology and Oncology)   Assessment:   This is a routine wellness examination for [name].  Exercise Activities and Dietary recommendations Current Exercise Habits: The patient does not participate in regular exercise at present, Exercise limited by: None identified  Goals    . Exercise 3x per week (30 min per time)     Patient states that he would like try to start exercising on a more consistent basis.        Fall Risk Fall Risk  03/30/2017 03/30/2017 03/13/2017 09/11/2016 03/19/2016  Falls in the past year? No No No No No   Is the patient's home free of loose throw rugs in walkways, pet beds, electrical cords, etc?   yes      Grab bars in the bathroom? yes      Handrails on the stairs?   yes      Adequate lighting?   yes  Timed Get Up and Go Performed: yes, completed within 30 seconds   Depression Screen PHQ 2/9 Scores 03/30/2017 03/30/2017 03/13/2017 09/11/2016  PHQ - 2 Score 0 0 0 0    Cognitive Function     6CIT Screen 03/30/2017  What Year? 0 points  What month? 0 points  What time? 0 points  Count back from 20 0 points  Months in reverse 0 points  Repeat phrase 0 points  Total Score 0    Immunization History  Administered Date(s) Administered  . Influenza Split 11/11/2013, 10/27/2014  . Influenza-Unspecified 11/11/2013, 10/31/2015, 10/26/2016  . Pneumococcal Conjugate-13 03/30/2017    Qualifies for Shingles Vaccine? Advised patient to check with his pharmacy about receiving the Shingrix vaccine   Screening Tests Health Maintenance  Topic Date Due  . Hepatitis C Screening  07[birth year]  . PNA vac Low Risk Adult (2 of 2 - PPSV23) 03/31/2018  . COLONOSCOPY  06/08/2019  . TETANUS/TDAP  06/08/2019   Cancer Screenings: Lung: Low  Dose CT Chest recommended if Age 30-80 years, 30 pack-year currently smoking OR have quit w/in 15years. Patient does not qualify. Colorectal: colonoscopy completed 06/07/2009  Additional Screenings:  Hepatitis B/HIV/Syphillis: not indicated  Hepatitis C Screening: Patient will discuss this with his PCP first    Plan:   I have personally reviewed and noted the following in the patient's chart:   . Medical and social history . Use of alcohol, tobacco or illicit drugs  . Current medications and supplements . Functional ability and status . Nutritional status . Physical activity . Advanced directives . List of other physicians . Hospitalizations, surgeries, and ER visits in previous 18  months . Vitals . Screenings to include cognitive, depression, and falls . Referrals and appointments  In addition, I have reviewed and discussed with patient certain preventive protocols, quality metrics, and best practice recommendations. A written personalized care plan for preventive services as well as general preventive health recommendations were provided to patient.   1. Encounter for Medicare annual wellness exam  2. Need for vaccination with 13-polyvalent pneumococcal conjugate vaccine - PCV 13 (Prevnar)   Andrez Grime, [place]  08/27/9560

## 2017-05-05 ENCOUNTER — Encounter: Payer: Self-pay | Admitting: Physician Assistant

## 2017-05-25 ENCOUNTER — Telehealth: Payer: Self-pay | Admitting: *Deleted

## 2017-05-25 NOTE — Telephone Encounter (Signed)
A approved for clylobenzprime

## 2017-08-30 ENCOUNTER — Telehealth: Payer: Self-pay | Admitting: *Deleted

## 2017-08-30 NOTE — Telephone Encounter (Signed)
Faxed ROI to [facility]; release [id number]

## 2017-09-22 DIAGNOSIS — H2513 Age-related nuclear cataract, bilateral: Secondary | ICD-10-CM | POA: Diagnosis not present

## 2017-10-05 DIAGNOSIS — D1801 Hemangioma of skin and subcutaneous tissue: Secondary | ICD-10-CM | POA: Diagnosis not present

## 2017-10-05 DIAGNOSIS — L821 Other seborrheic keratosis: Secondary | ICD-10-CM | POA: Diagnosis not present

## 2017-10-05 DIAGNOSIS — B079 Viral wart, unspecified: Secondary | ICD-10-CM | POA: Diagnosis not present

## 2017-10-05 DIAGNOSIS — D485 Neoplasm of uncertain behavior of skin: Secondary | ICD-10-CM | POA: Diagnosis not present

## 2017-10-19 DIAGNOSIS — B078 Other viral warts: Secondary | ICD-10-CM | POA: Diagnosis not present

## 2017-11-22 ENCOUNTER — Other Ambulatory Visit: Payer: Self-pay | Admitting: Hematology and Oncology

## 2017-11-22 DIAGNOSIS — C9110 Chronic lymphocytic leukemia of B-cell type not having achieved remission: Secondary | ICD-10-CM

## 2017-11-23 ENCOUNTER — Inpatient Hospital Stay: Payer: Medicare HMO | Attending: Hematology and Oncology | Admitting: Hematology and Oncology

## 2017-11-23 ENCOUNTER — Inpatient Hospital Stay: Payer: Medicare HMO

## 2017-11-23 ENCOUNTER — Encounter: Payer: Self-pay | Admitting: Hematology and Oncology

## 2017-11-23 DIAGNOSIS — C9110 Chronic lymphocytic leukemia of B-cell type not having achieved remission: Secondary | ICD-10-CM | POA: Diagnosis not present

## 2017-11-23 DIAGNOSIS — Z79899 Other long term (current) drug therapy: Secondary | ICD-10-CM | POA: Diagnosis not present

## 2017-11-23 DIAGNOSIS — Z85828 Personal history of other malignant neoplasm of skin: Secondary | ICD-10-CM | POA: Diagnosis not present

## 2017-11-23 DIAGNOSIS — Z7982 Long term (current) use of aspirin: Secondary | ICD-10-CM | POA: Insufficient documentation

## 2017-11-23 LAB — CBC WITH DIFFERENTIAL/PLATELET
Abs Immature Granulocytes: 0.1 10*3/uL — ABNORMAL HIGH (ref 0.00–0.07)
BASOS PCT: 0 %
Basophils Absolute: 0.1 10*3/uL (ref 0.0–0.1)
EOS PCT: 1 %
Eosinophils Absolute: 0.2 10*3/uL (ref 0.0–0.5)
HCT: 46.8 % (ref 39.0–52.0)
Hemoglobin: 14.8 g/dL (ref 13.0–17.0)
Immature Granulocytes: 0 %
Lymphocytes Relative: 82 %
Lymphs Abs: 31.3 10*3/uL — ABNORMAL HIGH (ref 0.7–4.0)
MCH: 28.1 pg (ref 26.0–34.0)
MCHC: 31.6 g/dL (ref 30.0–36.0)
MCV: 89 fL (ref 80.0–100.0)
MONO ABS: 1.4 10*3/uL — AB (ref 0.1–1.0)
Monocytes Relative: 4 %
NRBC: 0 % (ref 0.0–0.2)
Neutro Abs: 4.9 10*3/uL (ref 1.7–7.7)
Neutrophils Relative %: 13 %
PLATELETS: 207 10*3/uL (ref 150–400)
RBC: 5.26 MIL/uL (ref 4.22–5.81)
RDW: 14.4 % (ref 11.5–15.5)
WBC: 38 10*3/uL — AB (ref 4.0–10.5)

## 2017-11-23 LAB — COMPREHENSIVE METABOLIC PANEL
ALT: 27 U/L (ref 0–44)
AST: 25 U/L (ref 15–41)
Albumin: 4.4 g/dL (ref 3.5–5.0)
Alkaline Phosphatase: 69 U/L (ref 38–126)
Anion gap: 10 (ref 5–15)
BUN: 12 mg/dL (ref 8–23)
CO2: 29 mmol/L (ref 22–32)
CREATININE: 1.32 mg/dL — AB (ref 0.61–1.24)
Calcium: 9.3 mg/dL (ref 8.9–10.3)
Chloride: 104 mmol/L (ref 98–111)
GFR, EST NON AFRICAN AMERICAN: 52 mL/min — AB (ref >60)
Glucose, Bld: 91 mg/dL (ref 70–99)
Potassium: 4 mmol/L (ref 3.5–5.1)
Sodium: 143 mmol/L (ref 135–145)
TOTAL PROTEIN: 6.8 g/dL (ref 6.5–8.1)
Total Bilirubin: 0.7 mg/dL (ref 0.3–1.2)

## 2017-11-23 NOTE — Progress Notes (Signed)
[place] OFFICE PROGRESS NOTE  Patient Care Team: Patient, No Pcp Per as PCP - General ([place]) Heath Lark, MD as Consulting Physician (Hematology and Oncology)  ASSESSMENT & PLAN:  CLL (chronic lymphocytic leukemia) ([place]) Clinically, his blood work appears stable. He has palpable left submandibular lymphadenopathy which has been stable I educated the patient signs and symptoms to watch out for disease progression I will continue his yearly followup for CLL with history, physical examination and blood work.  History of skin cancer He follows closely with dermatologist We discussed the importance of regular surveillance and avoidance of excessive sun exposure   No orders of the defined types were placed in this encounter.   INTERVAL HISTORY: Please see below for problem oriented charting. He returns for further follow-up He denies new lymphadenopathy Denies recent infection, fever or chills No anorexia No new skin lesions He follows closely with dermatologist  SUMMARY OF ONCOLOGIC HISTORY:  Tyler Wu was transferred to my care after his prior physician has left.  I reviewed the patient's records extensive and collaborated the history with the patient. Summary of his history is as follows: He presented with an episode of severe pharyngitis back in March of 2005 and was found to have leukocytosis with absolute lymphocytosis. Diagnosis of chronic lymphocytic leukemia, RAI stage 0 was made based on typical CBC findings and peripheral blood flow cytometry. He initial total his white count was about 16,000 at that time. White count has slowly drifted up into the 30-50,000 range with 80% to 90% lymphocytes but has been overall stable at this level for at least the last 7 years recorded in the computer since August of 2007. He has not developed anemia or thrombocytopenia. He has not had any history of recurrent or severe infections. He has never had a  herpes zoster infection. Total serum immunoglobulins run in the low normal range with IgM consistently low. No monoclonal proteins on IFE. No signs of any paraneoplastic hemolytic anemia with normal reticulocyte count and negative Coombs test. His only new complaint today is related to multiple skin nodules. 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 He underwent excision of the lump in November 2015 and it came back benign  REVIEW OF SYSTEMS:   Constitutional: Denies fevers, chills or abnormal weight loss Eyes: Denies blurriness of vision Ears, nose, mouth, throat, and face: Denies mucositis or sore throat Respiratory: Denies cough, dyspnea or wheezes Cardiovascular: Denies palpitation, chest discomfort or lower extremity swelling Gastrointestinal:  Denies nausea, heartburn or change in bowel habits Skin: Denies abnormal skin rashes Lymphatics: Denies new lymphadenopathy or easy bruising Neurological:Denies numbness, tingling or new weaknesses Behavioral/Psych: Mood is stable, no new changes  All other systems were reviewed with the patient and are negative.  I have reviewed the past medical history, past surgical history, social history and family history with the patient and they are unchanged from previous note.  ALLERGIES:  is allergic to levofloxacin.  MEDICATIONS:  Current Outpatient Medications  Medication Sig Dispense Refill  . aspirin 81 MG chewable tablet Chew 81 mg by mouth.    . loratadine (CLARITIN) 10 MG tablet Take 10 mg by mouth.     No current facility-administered medications for this visit.     PHYSICAL EXAMINATION: ECOG PERFORMANCE STATUS: 1 - Symptomatic but completely ambulatory  Vitals:   11/23/17 1309  BP: (!) 143/74  Pulse: (!) 59  Resp: 18  Temp: 98 F (36.7 C)  SpO2: 100%   Filed Weights   11/23/17 1309  Weight: 169 lb 9.6 oz (76.9 kg)    GENERAL:alert, no distress and  comfortable SKIN: skin color, texture, turgor are normal, no rashes or significant lesions EYES: normal, Conjunctiva are pink and non-injected, sclera clear OROPHARYNX:no exudate, no erythema and lips, buccal mucosa, and tongue normal  NECK: supple, thyroid normal size, non-tender, without nodularity LYMPH: He has palpable lymphadenopathy in the submandibular region LUNGS: clear to auscultation and percussion with normal breathing effort HEART: regular rate & rhythm and no murmurs and no lower extremity edema ABDOMEN:abdomen soft, non-tender and normal bowel sounds Musculoskeletal:no cyanosis of digits and no clubbing  NEURO: alert & oriented x 3 with fluent speech, no focal motor/sensory deficits  LABORATORY DATA:  I have reviewed the data as listed    Component Value Date/Time   NA 143 11/23/2017 1233   NA 142 03/19/2016 1008   NA 140 11/22/2014 1320   K 4.0 11/23/2017 1233   K 3.8 11/22/2014 1320   CL 104 11/23/2017 1233   CL 107 11/20/2011 1011   CO2 29 11/23/2017 1233   CO2 24 11/22/2014 1320   GLUCOSE 91 11/23/2017 1233   GLUCOSE 154 (H) 11/22/2014 1320   GLUCOSE 87 11/20/2011 1011   BUN 12 11/23/2017 1233   BUN 13 03/19/2016 1008   BUN 10.5 11/22/2014 1320   CREATININE 1.32 (H) 11/23/2017 1233   CREATININE 1.29 (H) 03/14/2015 0841   CREATININE 1.3 11/22/2014 1320   CALCIUM 9.3 11/23/2017 1233   CALCIUM 9.1 11/22/2014 1320   PROT 6.8 11/23/2017 1233   PROT 6.6 03/19/2016 1008   PROT 6.4 11/22/2014 1320   ALBUMIN 4.4 11/23/2017 1233   ALBUMIN 4.8 03/19/2016 1008   ALBUMIN 4.1 11/22/2014 1320   AST 25 11/23/2017 1233   AST 13 11/22/2014 1320   ALT 27 11/23/2017 1233   ALT 14 11/22/2014 1320   ALKPHOS 69 11/23/2017 1233   ALKPHOS 67 11/22/2014 1320   BILITOT 0.7 11/23/2017 1233   BILITOT 0.5 03/19/2016 1008   BILITOT 0.64 11/22/2014 1320   GFRNONAA 52 (L) 11/23/2017 1233   GFRAA >60 11/23/2017 1233    No results found for: SPEP, UPEP  Lab Results   Component Value Date   WBC 38.0 (H) 11/23/2017   NEUTROABS 4.9 11/23/2017   HGB 14.8 11/23/2017   HCT 46.8 11/23/2017   MCV 89.0 11/23/2017   PLT 207 11/23/2017      Chemistry      Component Value Date/Time   NA 143 11/23/2017 1233   NA 142 03/19/2016 1008   NA 140 11/22/2014 1320   K 4.0 11/23/2017 1233   K 3.8 11/22/2014 1320   CL 104 11/23/2017 1233   CL 107 11/20/2011 1011   CO2 29 11/23/2017 1233   CO2 24 11/22/2014 1320   BUN 12 11/23/2017 1233   BUN 13 03/19/2016 1008   BUN 10.5 11/22/2014 1320   CREATININE 1.32 (H) 11/23/2017 1233   CREATININE 1.29 (H) 03/14/2015 0841   CREATININE 1.3 11/22/2014 1320      Component Value Date/Time   CALCIUM 9.3 11/23/2017 1233   CALCIUM 9.1 11/22/2014 1320   ALKPHOS 69 11/23/2017 1233   ALKPHOS 67 11/22/2014 1320   AST 25 11/23/2017 1233   AST 13 11/22/2014 1320   ALT 27 11/23/2017 1233   ALT 14 11/22/2014 1320   BILITOT 0.7 11/23/2017 1233   BILITOT 0.5 03/19/2016 1008  BILITOT 0.64 11/22/2014 1320      All questions were answered. The patient knows to call the clinic with any problems, questions or concerns. No barriers to learning was detected.  I spent 10 minutes counseling the patient face to face. The total time spent in the appointment was 15 minutes and more than 50% was on counseling and review of test results  Heath Lark, MD 11/23/2017 2:08 PM

## 2017-11-23 NOTE — Assessment & Plan Note (Signed)
He follows closely with dermatologist We discussed the importance of regular surveillance and avoidance of excessive sun exposure

## 2017-11-23 NOTE — Assessment & Plan Note (Signed)
Clinically, his blood work appears stable. He has palpable left submandibular lymphadenopathy which has been stable I educated the patient signs and symptoms to watch out for disease progression I will continue his yearly followup for CLL with history, physical examination and blood work.

## 2017-11-24 ENCOUNTER — Telehealth: Payer: Self-pay | Admitting: Hematology and Oncology

## 2017-11-24 NOTE — Telephone Encounter (Signed)
appt was already scheduled for Oct 2020.

## 2018-01-03 ENCOUNTER — Other Ambulatory Visit: Payer: Self-pay

## 2018-01-03 ENCOUNTER — Telehealth: Payer: Self-pay | Admitting: Family Medicine

## 2018-01-03 ENCOUNTER — Ambulatory Visit (INDEPENDENT_AMBULATORY_CARE_PROVIDER_SITE_OTHER): Payer: Medicare HMO | Admitting: Family Medicine

## 2018-01-03 ENCOUNTER — Encounter: Payer: Self-pay | Admitting: Family Medicine

## 2018-01-03 VITALS — BP 130/80 | HR 75 | Temp 98.8°F | Resp 17 | Ht 67.0 in | Wt 166.4 lb

## 2018-01-03 DIAGNOSIS — H6123 Impacted cerumen, bilateral: Secondary | ICD-10-CM | POA: Diagnosis not present

## 2018-01-03 DIAGNOSIS — J069 Acute upper respiratory infection, unspecified: Secondary | ICD-10-CM

## 2018-01-03 DIAGNOSIS — I10 Essential (primary) hypertension: Secondary | ICD-10-CM

## 2018-01-03 DIAGNOSIS — C9110 Chronic lymphocytic leukemia of B-cell type not having achieved remission: Secondary | ICD-10-CM

## 2018-01-03 DIAGNOSIS — E785 Hyperlipidemia, unspecified: Secondary | ICD-10-CM

## 2018-01-03 NOTE — Patient Instructions (Addendum)
If you have thyroid disease, diabetes, hypertension, tachycardia or seizure disorder If you take heart meds or ADD meds   You should not take Dextromorphan or Phenylephrine These are common found in decongestant and cold medications This can cause very high heart rate and high blood pressure.  Ask you pharmacist to make sure this ingredient is not present    If you have lab work done today you will be contacted with your lab results within the next 2 weeks.  If you have not heard from [place] then please contact us. The fastest way to get your results is to register for My Chart.   IF you received an x-ray today, you will receive an invoice from [facility]. Please contact [facility] at [phone] with questions or concerns regarding your invoice.   IF you received labwork today, you will receive an invoice from [place]. Please contact [facility] at [phone] with questions or concerns regarding your invoice.   Our billing staff will not be able to assist you with questions regarding bills from these companies.  You will be contacted with the lab results as soon as they are available. The fastest way to get your results is to activate your My Chart account. Instructions are located on the last page of this paperwork. If you have not heard from [place] regarding the results in 2 weeks, please contact this office.     Viral Respiratory Infection A respiratory infection is an illness that affects part of the respiratory system, such as the lungs, nose, or throat. Most respiratory infections are caused by either viruses or bacteria. A respiratory infection that is caused by a virus is called a viral respiratory infection. Common types of viral respiratory infections include:  A cold.  The flu (influenza).  A respiratory syncytial virus (RSV) infection.  How do I know if I have a viral respiratory infection? Most viral respiratory infections cause:  A stuffy or runny  nose.  Yellow or green nasal discharge.  A cough.  Sneezing.  Fatigue.  Achy muscles.  A sore throat.  Sweating or chills.  A fever.  A headache.  How are viral respiratory infections treated? If influenza is diagnosed early, it may be treated with an antiviral medicine that shortens the length of time a person has symptoms. Symptoms of viral respiratory infections may be treated with over-the-counter and prescription medicines, such as:  Expectorants. These make it easier to cough up mucus.  Decongestant nasal sprays.  Health care providers do not prescribe antibiotic medicines for viral infections. This is because antibiotics are designed to kill bacteria. They have no effect on viruses. How do I know if I should stay home from work or school? To avoid exposing others to your respiratory infection, stay home if you have:  A fever.  A persistent cough.  A sore throat.  A runny nose.  Sneezing.  Muscles aches.  Headaches.  Fatigue.  Weakness.  Chills.  Sweating.  Nausea.  Follow these instructions at home:  Rest as much as possible.  Take over-the-counter and prescription medicines only as told by your health care provider.  Drink enough fluid to keep your urine clear or pale yellow. This helps prevent dehydration and helps loosen up mucus.  Gargle with a salt-water mixture 3-4 times per day or as needed. To make a salt-water mixture, completely dissolve -1 tsp of salt in 1 cup of warm water.  Use nose drops made from salt water to ease congestion and soften raw  skin around your nose.  Do not drink alcohol.  Do not use tobacco products, including cigarettes, chewing tobacco, and e-cigarettes. If you need help quitting, ask your health care provider. Contact a health care provider if:  Your symptoms last for 10 days or longer.  Your symptoms get worse over time.  You have a fever.  You have severe sinus pain in your face or  forehead.  The glands in your jaw or neck become very swollen. Get help right away if:  You feel pain or pressure in your chest.  You have shortness of breath.  You faint or feel like you will faint.  You have severe and persistent vomiting.  You feel confused or disoriented. This information is not intended to replace advice given to you by your health care provider. Make sure you discuss any questions you have with your health care provider. Document Released: 10/22/2004 Document Revised: 06/20/2015 Document Reviewed: 06/20/2014 Elsevier Interactive Patient Education  [employer].

## 2018-01-03 NOTE — Telephone Encounter (Signed)
Will check levels before his next wellness visit

## 2018-01-03 NOTE — Progress Notes (Signed)
Established Patient Office Visit  Subjective:  Patient ID: Tyler Wu, male    DOB: 08-06-1945  Age: 72 y.o. MRN: [id number]  CC:  Chief Complaint  Patient presents with  . ?  uri x 1 week    taking otc equate dayquil/nyquil for sxs.  Very little mucus drainage and no color.  After otc meds wears off the pressure and burning return    HPI Tyler Wu presents for   Cough, congestion, headache, sore throat He started taking dayquil/nyquil for symptoms The cough is nonproductive and sputum After the otc meds wear off he starts to get pressure in the frontal sinuses He also gets watery eyes He already received a flu vaccine     Past Medical History:  Diagnosis Date  . Allergy   . 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    Past Surgical History:  Procedure Laterality Date  . HERNIA REPAIR  1996    Family History  Problem Relation Age of Onset  . Cancer Father 17       melonoma  . Heart attack Maternal Grandmother   . Hypertension Maternal Grandmother   . Heart attack Maternal Grandfather   . Hypertension Maternal Grandfather     Social History   Socioeconomic History  . Marital status: Married    Spouse name: Not on file  . Number of children: Not on file  . Years of education: Not on file  . Highest education level: Associate degree: academic program  Occupational History  . Occupation: Haematologist Needs  . Financial resource strain: Not hard at all  . Food insecurity:    Worry: Never true    Inability: Never true  . Transportation needs:    Medical: No    Non-medical: No  Tobacco Use  . Smoking status: Former Smoker    Types: Cigarettes    Last attempt to quit: 01/26/1974    Years since quitting: 43.9  . Smokeless tobacco: Never Used  Substance and Sexual Activity  . Alcohol use: No    Frequency: Never  . Drug use: No  . Sexual activity: Yes    Birth control/protection: None    Comment:  number of sex partners in the last 12 months  1  Lifestyle  . Physical activity:    Days per week: 0 days    Minutes per session: 0 min  . Stress: Only a little  Relationships  . Social connections:    Talks on phone: More than three times a week    Gets together: More than three times a week    Attends religious service: More than 4 times per year    Active member of club or organization: No    Attends meetings of clubs or organizations: Never    Relationship status: Married  . Intimate partner violence:    Fear of current or ex partner: No    Emotionally abused: No    Physically abused: No    Forced sexual activity: No  Other Topics Concern  . Not on file  Social History Narrative  . Not on file    Outpatient Medications Prior to Visit  Medication Sig Dispense Refill  . aspirin 81 MG chewable tablet Chew 81 mg by mouth.    . loratadine (CLARITIN) 10 MG tablet Take 10 mg by mouth.     No facility-administered medications prior to visit.     Allergies  Allergen Reactions  .  Levofloxacin Other (See Comments)    Bad dreams     ROS Review of Systems Review of Systems  Constitutional: Negative for activity change, appetite change, chills and fever.  HENT: see hpi   Respiratory: see hpi Gastrointestinal: Negative for diarrhea, nausea and vomiting.  Genitourinary: Negative for difficulty urinating, dysuria, flank pain and hematuria.  Musculoskeletal: Negative for back pain, joint swelling and neck pain.  Neurological: Negative for dizziness, speech difficulty, light-headedness and numbness.  See HPI. All other review of systems negative.     Objective:    Physical Exam  BP 130/80 (BP Location: Left Arm, Patient Position: Sitting, Cuff Size: Normal)   Pulse 75   Temp 98.8 F (37.1 C) (Oral)   Resp 17   Ht '5\' 7"'$  (1.702 m)   Wt 166 lb 6.4 oz (75.5 kg)   SpO2 95%   BMI 26.06 kg/m  Wt Readings from Last 3 Encounters:  01/03/18 166 lb 6.4 oz (75.5 kg)    11/23/17 169 lb 9.6 oz (76.9 kg)  03/30/17 167 lb (75.8 kg)   General: alert, oriented, in NAD Head: normocephalic, atraumatic, no sinus tenderness Eyes: EOM intact, no scleral icterus or conjunctival injection Ears: bilateral cerumen impacting the canal Nose: mucosa nonerythematous, nonedematous Throat: no pharyngeal exudate or erythema Lymph: no posterior auricular, submental or cervical lymph adenopathy Heart: normal rate, normal sinus rhythm, no murmurs Lungs: clear to auscultation bilaterally, no wheezing    Health Maintenance Due  Topic Date Due  . Hepatitis C Screening  [birth year]    There are no preventive care reminders to display for this patient.  No results found for: TSH Lab Results  Component Value Date   WBC 38.0 (H) 11/23/2017   HGB 14.8 11/23/2017   HCT 46.8 11/23/2017   MCV 89.0 11/23/2017   PLT 207 11/23/2017   Lab Results  Component Value Date   NA 143 11/23/2017   K 4.0 11/23/2017   CHLORIDE 106 11/22/2014   CO2 29 11/23/2017   GLUCOSE 91 11/23/2017   BUN 12 11/23/2017   CREATININE 1.32 (H) 11/23/2017   BILITOT 0.7 11/23/2017   ALKPHOS 69 11/23/2017   AST 25 11/23/2017   ALT 27 11/23/2017   PROT 6.8 11/23/2017   ALBUMIN 4.4 11/23/2017   CALCIUM 9.3 11/23/2017   ANIONGAP 10 11/23/2017   EGFR 54 (L) 11/22/2014   Lab Results  Component Value Date   CHOL 216 (H) 07/14/2012   Lab Results  Component Value Date   HDL 51 07/14/2012   Lab Results  Component Value Date   LDLCALC 129 (H) 07/14/2012   Lab Results  Component Value Date   TRIG 181 (H) 07/14/2012   Lab Results  Component Value Date   CHOLHDL 4.2 07/14/2012   No results found for: HGBA1C    Assessment & Plan:   Problem List Items Addressed This Visit    None    Visit Diagnoses    Bilateral impacted cerumen    -  Primary   Relevant Orders   Ear wax removal   Acute URI         Discussed viral etiology Discussed otc decongestant Advised flonase for congestion   Increase hydration Vitamin C and zinc lozenges suggested Return to clinic if symptoms worse  Impacted cerumen resolved with ear lavage   No orders of the defined types were placed in this encounter.   Follow-up: Return in about 3 months (around 04/04/2018) for [facility] .    Emiyah Spraggins  Tyler Lincoln, MD

## 2018-01-08 DIAGNOSIS — J019 Acute sinusitis, unspecified: Secondary | ICD-10-CM | POA: Diagnosis not present

## 2018-04-07 ENCOUNTER — Encounter: Payer: Self-pay | Admitting: Family Medicine

## 2018-04-07 ENCOUNTER — Other Ambulatory Visit: Payer: Self-pay

## 2018-04-07 ENCOUNTER — Ambulatory Visit (INDEPENDENT_AMBULATORY_CARE_PROVIDER_SITE_OTHER): Payer: Medicare HMO | Admitting: Family Medicine

## 2018-04-07 VITALS — BP 150/77 | HR 68 | Temp 98.6°F | Resp 17 | Ht 67.0 in | Wt 167.4 lb

## 2018-04-07 DIAGNOSIS — Z136 Encounter for screening for cardiovascular disorders: Secondary | ICD-10-CM

## 2018-04-07 DIAGNOSIS — Z13220 Encounter for screening for lipoid disorders: Secondary | ICD-10-CM

## 2018-04-07 DIAGNOSIS — E785 Hyperlipidemia, unspecified: Secondary | ICD-10-CM | POA: Diagnosis not present

## 2018-04-07 DIAGNOSIS — Z1159 Encounter for screening for other viral diseases: Secondary | ICD-10-CM

## 2018-04-07 DIAGNOSIS — C9110 Chronic lymphocytic leukemia of B-cell type not having achieved remission: Secondary | ICD-10-CM | POA: Diagnosis not present

## 2018-04-07 DIAGNOSIS — R030 Elevated blood-pressure reading, without diagnosis of hypertension: Secondary | ICD-10-CM

## 2018-04-07 DIAGNOSIS — Z125 Encounter for screening for malignant neoplasm of prostate: Secondary | ICD-10-CM | POA: Diagnosis not present

## 2018-04-07 DIAGNOSIS — Z856 Personal history of leukemia: Secondary | ICD-10-CM | POA: Diagnosis not present

## 2018-04-07 DIAGNOSIS — Z0000 Encounter for general adult medical examination without abnormal findings: Secondary | ICD-10-CM | POA: Diagnosis not present

## 2018-04-07 NOTE — Progress Notes (Signed)
Established Patient Office Visit  Subjective:  Patient ID: Tyler Wu, male    DOB: 1946-01-23  Age: 73 y.o. MRN: [id number]  CC:  Chief Complaint  Patient presents with  . wellness exam    cpe    HPI Tyler Wu presents for   Patient had his wellness exam and is here today for screenings CLL  Patient has a history of chronic lymphocytic leukemia and is in active surveillance He denies night sweats or unexaplained weight loss He sees Oncology  Elevated bp  He also has elevated blood pressure without a history of hypertension Pt exercises 2 times a week He eats a balanced diet No chest pain, no LE edema, no palpitations  Hyperlipidemia He has not had his cholesterol checked in years He denies chest pains, shortness of breath Not on any meds for cholesterol  Lab Results  Component Value Date   CHOL 216 (H) 07/14/2012   CHOL 213 (H) 05/14/2011   Lab Results  Component Value Date   HDL 51 07/14/2012   HDL 48 05/14/2011   Lab Results  Component Value Date   LDLCALC 129 (H) 07/14/2012   LDLCALC 122 (H) 05/14/2011   Lab Results  Component Value Date   TRIG 181 (H) 07/14/2012   TRIG 214 (H) 05/14/2011   Lab Results  Component Value Date   CHOLHDL 4.2 07/14/2012   CHOLHDL 4.4 05/14/2011   No results found for: LDLDIRECT  Past Medical History:  Diagnosis Date  . Allergy   . 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    Past Surgical History:  Procedure Laterality Date  . HERNIA REPAIR  1996    Family History  Problem Relation Age of Onset  . Cancer Father 25       melonoma  . Heart attack Maternal Grandmother   . Hypertension Maternal Grandmother   . Heart attack Maternal Grandfather   . Hypertension Maternal Grandfather     Social History   Socioeconomic History  . Marital status: Married    Spouse name: Not on file  . Number of children: Not on file  . Years of education: Not on file  .  Highest education level: Associate degree: academic program  Occupational History  . Occupation: Haematologist Needs  . Financial resource strain: Not hard at all  . Food insecurity:    Worry: Never true    Inability: Never true  . Transportation needs:    Medical: No    Non-medical: No  Tobacco Use  . Smoking status: Former Smoker    Types: Cigarettes    Last attempt to quit: 01/26/1974    Years since quitting: 44.2  . Smokeless tobacco: Never Used  Substance and Sexual Activity  . Alcohol use: No    Frequency: Never  . Drug use: No  . Sexual activity: Yes    Birth control/protection: None    Comment: number of sex partners in the last 12 months  1  Lifestyle  . Physical activity:    Days per week: 0 days    Minutes per session: 0 min  . Stress: Only a little  Relationships  . Social connections:    Talks on phone: More than three times a week    Gets together: More than three times a week    Attends religious service: More than 4 times per year    Active member of club or organization: No  Attends meetings of clubs or organizations: Never    Relationship status: Married  . Intimate partner violence:    Fear of current or ex partner: No    Emotionally abused: No    Physically abused: No    Forced sexual activity: No  Other Topics Concern  . Not on file  Social History Narrative  . Not on file    Outpatient Medications Prior to Visit  Medication Sig Dispense Refill  . aspirin 81 MG chewable tablet Chew 81 mg by mouth.    . loratadine (CLARITIN) 10 MG tablet Take 10 mg by mouth.     No facility-administered medications prior to visit.     Allergies  Allergen Reactions  . Levofloxacin Other (See Comments)    Bad dreams     ROS Review of Systems Review of Systems  Constitutional: Negative for activity change, appetite change, chills and fever.  HENT: Negative for congestion, nosebleeds, trouble swallowing and voice change.   Respiratory: Negative  for cough, shortness of breath and wheezing.   Gastrointestinal: Negative for diarrhea, nausea and vomiting.  Genitourinary: Negative for difficulty urinating, dysuria, flank pain and hematuria.  Musculoskeletal: Negative for back pain, joint swelling and neck pain.  Neurological: Negative for dizziness, speech difficulty, light-headedness and numbness.  See HPI. All other review of systems negative.     Objective:    Physical Exam  BP (!) 150/77 (BP Location: Right Arm, Patient Position: Sitting, Cuff Size: Normal)   Pulse 68   Temp 98.6 F (37 C) (Oral)   Resp 17   Ht 5' 7"  (1.702 m)   Wt 167 lb 6.4 oz (75.9 kg)   SpO2 96%   BMI 26.22 kg/m  Wt Readings from Last 3 Encounters:  04/07/18 167 lb 6.4 oz (75.9 kg)  01/03/18 166 lb 6.4 oz (75.5 kg)  11/23/17 169 lb 9.6 oz (76.9 kg)   General: alert, oriented, in NAD Head: normocephalic, atraumatic, no sinus tenderness Eyes: EOM intact, no scleral icterus or conjunctival injection Ears: TM clear bilaterally Nose: mucosa nonerythematous, nonedematous Throat: no pharyngeal exudate or erythema Lymph: no posterior auricular, submental or cervical lymph adenopathy Heart: normal rate, normal sinus rhythm, no murmurs Lungs: clear to auscultation bilaterally, no wheezing Abdomen: Nondistended, normoactive bowel sounds, soft, nontender Rectal: Chaperone present, normal rectal tone, sphincter, mildly enlarged prostate Extremities: Warm, well-perfused, capillary refill less than 2 seconds  Health Maintenance Due  Topic Date Due  . Hepatitis C Screening  Mar 20, 1945  . PNA vac Low Risk Adult (2 of 2 - PPSV23) 03/31/2018    There are no preventive care reminders to display for this patient.  No results found for: TSH Lab Results  Component Value Date   WBC 38.0 (H) 11/23/2017   HGB 14.8 11/23/2017   HCT 46.8 11/23/2017   MCV 89.0 11/23/2017   PLT 207 11/23/2017   Lab Results  Component Value Date   NA 143 11/23/2017   K 4.0  11/23/2017   CHLORIDE 106 11/22/2014   CO2 29 11/23/2017   GLUCOSE 91 11/23/2017   BUN 12 11/23/2017   CREATININE 1.32 (H) 11/23/2017   BILITOT 0.7 11/23/2017   ALKPHOS 69 11/23/2017   AST 25 11/23/2017   ALT 27 11/23/2017   PROT 6.8 11/23/2017   ALBUMIN 4.4 11/23/2017   CALCIUM 9.3 11/23/2017   ANIONGAP 10 11/23/2017   EGFR 54 (L) 11/22/2014   Lab Results  Component Value Date   CHOL 216 (H) 07/14/2012   Lab Results  Component Value Date   HDL 51 07/14/2012   Lab Results  Component Value Date   LDLCALC 129 (H) 07/14/2012   Lab Results  Component Value Date   TRIG 181 (H) 07/14/2012   Lab Results  Component Value Date   CHOLHDL 4.2 07/14/2012   No results found for: HGBA1C    Assessment & Plan:   Problem List Items Addressed This Visit      Other   CLL (chronic lymphocytic leukemia) ([place])  -  Will check cbc    Other Visit Diagnoses    Encounter for lipid screening for cardiovascular disease    -  Primary   Relevant Orders   Lipid panel   Prostate cancer screening       Relevant Orders   PSA   HCV Ab w Reflex to Quant PCR   Comprehensive metabolic panel   Lipid panel   CBC   Dyslipidemia    - will check levels    Elevated blood pressure reading    -  Increased exercise      No orders of the defined types were placed in this encounter.   Follow-up: No follow-ups on file.    Forrest Moron, MD

## 2018-04-07 NOTE — Patient Instructions (Signed)
° ° ° °  If you have lab work done today you will be contacted with your lab results within the next 2 weeks.  If you have not heard from us then please contact us. The fastest way to get your results is to register for My Chart. ° ° °IF you received an x-ray today, you will receive an invoice from [facility]. Please contact [facility] at [phone] with questions or concerns regarding your invoice.  ° °IF you received labwork today, you will receive an invoice from [facility]. Please contact [facility] at [phone] with questions or concerns regarding your invoice.  ° °Our billing staff will not be able to assist you with questions regarding bills from these companies. ° °You will be contacted with the lab results as soon as they are available. The fastest way to get your results is to activate your My Chart account. Instructions are located on the last page of this paperwork. If you have not heard from us regarding the results in 2 weeks, please contact this office. °  ° ° ° °

## 2018-04-08 ENCOUNTER — Telehealth: Payer: Self-pay

## 2018-04-08 NOTE — Telephone Encounter (Signed)
Pt calling back. Please advise.

## 2018-04-08 NOTE — Telephone Encounter (Signed)
Left message on voicemail to return office call as we need him to come in for repeat labs as one lab reading may be an error and we want to recheck for accuracy.  Advise would like to schedule a double book appt with [facility] at 3 pm today.  Advised to eat and drink normally and call me back so I may confirm message received and clarify any questions he may have. Dgaddy, CMA

## 2018-04-09 LAB — CBC
Hematocrit: 45.7 % (ref 37.5–51.0)
Hemoglobin: 15.1 g/dL (ref 13.0–17.7)
MCH: 28.4 pg (ref 26.6–33.0)
MCHC: 33 g/dL (ref 31.5–35.7)
MCV: 86 fL (ref 79–97)
Platelets: 203 10*3/uL (ref 150–450)
RBC: 5.31 x10E6/uL (ref 4.14–5.80)
RDW: 13.6 % (ref 11.6–15.4)
WBC: 40.1 10*3/uL (ref 3.4–10.8)

## 2018-04-09 LAB — LIPID PANEL
Chol/HDL Ratio: 4.2 ratio (ref 0.0–5.0)
Cholesterol, Total: 241 mg/dL — ABNORMAL HIGH (ref 100–199)
HDL: 57 mg/dL (ref >39)
LDL CALC: 137 mg/dL — AB (ref 0–99)
Triglycerides: 235 mg/dL — ABNORMAL HIGH (ref 0–149)
VLDL Cholesterol Cal: 47 mg/dL — ABNORMAL HIGH (ref 5–40)

## 2018-04-09 LAB — COMPREHENSIVE METABOLIC PANEL
A/G RATIO: 2.5 — AB (ref 1.2–2.2)
ALK PHOS: 72 IU/L (ref 39–117)
ALT: 19 IU/L (ref 0–44)
AST: 19 IU/L (ref 0–40)
Albumin: 4.9 g/dL — ABNORMAL HIGH (ref 3.7–4.7)
BUN/Creatinine Ratio: 8 — ABNORMAL LOW (ref 10–24)
BUN: 11 mg/dL (ref 8–27)
Bilirubin Total: 0.4 mg/dL (ref 0.0–1.2)
CO2: 20 mmol/L (ref 20–29)
Calcium: 9.4 mg/dL (ref 8.6–10.2)
Chloride: 101 mmol/L (ref 96–106)
Creatinine, Ser: 1.41 mg/dL — ABNORMAL HIGH (ref 0.76–1.27)
GFR, EST AFRICAN AMERICAN: 57 mL/min/{1.73_m2} — AB (ref >59)
GFR, EST NON AFRICAN AMERICAN: 49 mL/min/{1.73_m2} — AB (ref >59)
Globulin, Total: 2 g/dL (ref 1.5–4.5)
Glucose: 93 mg/dL (ref 65–99)
Potassium: 4 mmol/L (ref 3.5–5.2)
Sodium: 143 mmol/L (ref 134–144)
Total Protein: 6.9 g/dL (ref 6.0–8.5)

## 2018-04-09 LAB — HCV AB W REFLEX TO QUANT PCR: HCV Ab: <0.1 s/co ratio (ref 0.0–0.9)

## 2018-04-09 LAB — HCV INTERPRETATION

## 2018-04-09 LAB — PSA: PROSTATE SPECIFIC AG, SERUM: 1.8 ng/mL (ref 0.0–4.0)

## 2018-04-11 ENCOUNTER — Other Ambulatory Visit: Payer: Self-pay

## 2018-04-11 DIAGNOSIS — C9210 Chronic myeloid leukemia, BCR/ABL-positive, not having achieved remission: Secondary | ICD-10-CM

## 2018-04-11 NOTE — Telephone Encounter (Signed)
Left message on voicemail to return my call so I can get his oncology MD number and just clarify why we are redrawing labs. Dgaddy, CMA

## 2018-04-11 NOTE — Telephone Encounter (Signed)
Spoke with pt about concerns and about repeat lab draw. Also advised him of his appointment on 04/14/2018.

## 2018-04-14 ENCOUNTER — Ambulatory Visit (INDEPENDENT_AMBULATORY_CARE_PROVIDER_SITE_OTHER): Payer: Medicare HMO | Admitting: Family Medicine

## 2018-04-14 ENCOUNTER — Other Ambulatory Visit: Payer: Self-pay

## 2018-04-14 ENCOUNTER — Encounter: Payer: Self-pay | Admitting: Family Medicine

## 2018-04-14 NOTE — Progress Notes (Signed)
Did not need to recheck. Reviewed history and labs are stable.

## 2018-04-14 NOTE — Patient Instructions (Signed)
° ° ° °  If you have lab work done today you will be contacted with your lab results within the next 2 weeks.  If you have not heard from us then please contact us. The fastest way to get your results is to register for My Chart. ° ° °IF you received an x-ray today, you will receive an invoice from [facility]. Please contact [facility] at [phone] with questions or concerns regarding your invoice.  ° °IF you received labwork today, you will receive an invoice from [facility]. Please contact [facility] at [phone] with questions or concerns regarding your invoice.  ° °Our billing staff will not be able to assist you with questions regarding bills from these companies. ° °You will be contacted with the lab results as soon as they are available. The fastest way to get your results is to activate your My Chart account. Instructions are located on the last page of this paperwork. If you have not heard from us regarding the results in 2 weeks, please contact this office. °  ° ° ° °
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
## 2018-07-06 ENCOUNTER — Ambulatory Visit (INDEPENDENT_AMBULATORY_CARE_PROVIDER_SITE_OTHER): Payer: Medicare HMO | Admitting: Family Medicine

## 2018-07-06 ENCOUNTER — Other Ambulatory Visit: Payer: Self-pay

## 2018-07-06 VITALS — BP 150/77 | Ht 67.0 in | Wt 167.0 lb

## 2018-07-06 DIAGNOSIS — Z0000 Encounter for general adult medical examination without abnormal findings: Secondary | ICD-10-CM

## 2018-07-06 NOTE — Progress Notes (Signed)
Presents today for [employer] Visit   Date of last exam:  04-14-2018  Interpreter used for this visit?  No  I connected with  [name] on 07/06/18 by telephone  and verified that I am speaking with the correct person using two identifiers.   I discussed the limitations of evaluation and management by telemedicine. The patient expressed understanding and agreed to proceed.    Patient Care Team: Forrest Moron, MD as PCP - General (Internal Medicine) Heath Lark, MD as Consulting Physician (Hematology and Oncology)   Other items to address today:   Discussed immunizations Discussed eye/dental yearly    Other Screening: Last screening for diabetes: 01/03/2018 Last lipid screening:04/07/2018  ADVANCE DIRECTIVES: Discussed: yes On File:no copy requested Materials Provided: no    Immunization status:  Immunization History  Administered Date(s) Administered  . Influenza Split 11/11/2013, 10/27/2014, 11/10/2017  . Influenza-Unspecified 11/11/2013, 10/31/2015, 10/26/2016  . Pneumococcal Conjugate-13 03/30/2017     There are no preventive care reminders to display for this patient.   Functional Status Survey: Is the patient deaf or have difficulty hearing?: No Does the patient have difficulty seeing, even when wearing glasses/contacts?: No Does the patient have difficulty concentrating, remembering, or making decisions?: No Does the patient have difficulty walking or climbing stairs?: No Does the patient have difficulty dressing or bathing?: No Does the patient have difficulty doing errands alone such as visiting a doctor's office or shopping?: No   6CIT Screen 07/06/2018 03/30/2017  What Year? 0 points 0 points  What month? 0 points 0 points  What time? 0 points 0 points  Count back from 20 0 points 0 points  Months in reverse 0 points 0 points  Repeat phrase 0 points 0 points  Total Score 0 0        Clinical Support from 07/06/2018  in [facility]  AUDIT-C Score  2       Home Environment:   Lives in two story home with wife Working from home.  No scattered rugs Grab bars yes Adequate lighting in home     Patient Active Problem List   Diagnosis Date Noted  . History of skin cancer 11/24/2016  . Lymphadenopathy of head and neck 11/22/2014  . Multiple skin nodules 11/22/2013  . 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 CLL (chronic lymphocytic leukemia) ([place]) 03/24/2011     Past Medical History:  Diagnosis Date  . Allergy   . 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     Past Surgical History:  Procedure Laterality Date  . HERNIA REPAIR  1996     Family History  Problem Relation Age of Onset  . Cancer Father 65       melonoma  . Heart attack Maternal Grandmother   . Hypertension Maternal Grandmother   . Heart attack Maternal Grandfather   . Hypertension Maternal Grandfather      Social History   Socioeconomic History  . Marital status: Married    Spouse name: Not on file  . Number of children: 2  . Years of education: Not on file  . Highest education level: Associate degree: academic program  Occupational History  . Occupation: Haematologist Needs  . Financial resource strain: Not hard at all  . Food insecurity:    Worry: Never true    Inability: Never true  . Transportation needs:    Medical: No    Non-medical: No  Tobacco  Use  . Smoking status: Former Smoker    Types: Cigarettes    Last attempt to quit: 01/26/1974    Years since quitting: 44.4  . Smokeless tobacco: Never Used  Substance and Sexual Activity  . Alcohol use: No    Frequency: Never  . Drug use: No  . Sexual activity: Yes    Birth control/protection: None    Comment: number of sex partners in the last 12 months  1  Lifestyle  . Physical activity:    Days per week: 0 days    Minutes per session: 0 min  . Stress: Only a little  Relationships  . Social connections:     Talks on phone: More than three times a week    Gets together: More than three times a week    Attends religious service: More than 4 times per year    Active member of club or organization: No    Attends meetings of clubs or organizations: Never    Relationship status: Married  . Intimate partner violence:    Fear of current or ex partner: No    Emotionally abused: No    Physically abused: No    Forced sexual activity: No  Other Topics Concern  . Not on file  Social History Narrative  . Not on file     Allergies  Allergen Reactions  . Levofloxacin Other (See Comments)    Bad dreams      Prior to Admission medications   Medication Sig Start Date End Date Taking? Authorizing Provider  aspirin 81 MG chewable tablet Chew 81 mg by mouth.   Yes [provider]  loratadine (CLARITIN) 10 MG tablet Take 10 mg by mouth.   Yes [provider]     Depression screen [facility] 2/9 07/06/2018 04/14/2018 04/07/2018 01/03/2018 03/30/2017  Decreased Interest 0 0 0 0 0  Down, Depressed, Hopeless 0 0 0 0 0  PHQ - 2 Score 0 0 0 0 0     Fall Risk  07/06/2018 04/14/2018 04/07/2018 01/03/2018 03/30/2017  Falls in the past year? 0 0 0 0 No  Number falls in past yr: 0 0 0 - -  Injury with Fall? 0 0 0 - -  Follow up Falls evaluation completed;Education provided;Falls prevention discussed Falls evaluation completed Falls evaluation completed - -      PHYSICAL EXAM: BP (!) 150/77   Ht 5\' 7"  (1.702 m)   Wt 167 lb (75.8 kg)   BMI 26.16 kg/m    Wt Readings from Last 3 Encounters:  07/06/18 167 lb (75.8 kg)  04/14/18 168 lb 12.8 oz (76.6 kg)  04/07/18 167 lb 6.4 oz (75.9 kg)     No exam data present    Physical Exam   Education/Counseling provided regarding diet and exercise, prevention of chronic diseases, smoking/tobacco cessation, if applicable, and reviewed "Covered Medicare Preventive Services."   ASSESSMENT/PLAN: 1. Medicare annual wellness visit, subsequent

## 2018-07-06 NOTE — Patient Instructions (Addendum)
Thank you for taking time to come for your Medicare Wellness Visit. I appreciate your ongoing commitment to your health goals. Please review the following plan we discussed and let me know if I can assist you in the future.  Leroy Kennedy LPN   Healthy Eating Following a healthy eating pattern may help you to achieve and maintain a healthy body weight, reduce the risk of chronic disease, and live a long and productive life. It is important to follow a healthy eating pattern at an appropriate calorie level for your body. Your nutritional needs should be met primarily through food by choosing a variety of nutrient-rich foods. What are tips for following this plan? Reading food labels  Read labels and choose the following: ? Reduced or low sodium. ? Juices with 100% fruit juice. ? Foods with low saturated fats and high polyunsaturated and monounsaturated fats. ? Foods with whole grains, such as whole wheat, cracked wheat, brown rice, and wild rice. ? Whole grains that are fortified with folic acid. This is recommended for women who are pregnant or who want to become pregnant.  Read labels and avoid the following: ? Foods with a lot of added sugars. These include foods that contain brown sugar, corn sweetener, corn syrup, dextrose, fructose, glucose, high-fructose corn syrup, honey, invert sugar, lactose, malt syrup, maltose, molasses, raw sugar, sucrose, trehalose, or turbinado sugar.  Do not eat more than the following amounts of added sugar per day:  6 teaspoons (25 g) for women.  9 teaspoons (38 g) for men. ? Foods that contain processed or refined starches and grains. ? Refined grain products, such as white flour, degermed cornmeal, white bread, and white rice. Shopping  Choose nutrient-rich snacks, such as vegetables, whole fruits, and nuts. Avoid high-calorie and high-sugar snacks, such as potato chips, fruit snacks, and candy.  Use oil-based dressings and spreads on foods instead of  solid fats such as butter, stick margarine, or cream cheese.  Limit pre-made sauces, mixes, and "instant" products such as flavored rice, instant noodles, and ready-made pasta.  Try more plant-protein sources, such as tofu, tempeh, black beans, edamame, lentils, nuts, and seeds.  Explore eating plans such as the Mediterranean diet or vegetarian diet. Cooking  Use oil to saut or stir-fry foods instead of solid fats such as butter, stick margarine, or lard.  Try baking, boiling, grilling, or broiling instead of frying.  Remove the fatty part of meats before cooking.  Steam vegetables in water or broth. Meal planning   At meals, imagine dividing your plate into fourths: ? One-half of your plate is fruits and vegetables. ? One-fourth of your plate is whole grains. ? One-fourth of your plate is protein, especially lean meats, poultry, eggs, tofu, beans, or nuts.  Include low-fat dairy as part of your daily diet. Lifestyle  Choose healthy options in all settings, including home, work, school, restaurants, or stores.  Prepare your food safely: ? Wash your hands after handling raw meats. ? Keep food preparation surfaces clean by regularly washing with hot, soapy water. ? Keep raw meats separate from ready-to-eat foods, such as fruits and vegetables. ? Cook seafood, meat, poultry, and eggs to the recommended internal temperature. ? Store foods at safe temperatures. In general:  Keep cold foods at 24F (4.4C) or below.  Keep hot foods at 124F (60C) or above.  Keep your freezer at [facility] (-17.8C) or below.  Foods are no longer safe to eat when they have been between the temperatures of 40-124F (4.4-60C)  for more than 2 hours. What foods should I eat? Fruits Aim to eat 2 cup-equivalents of fresh, canned (in natural juice), or frozen fruits each day. Examples of 1 cup-equivalent of fruit include 1 small apple, 8 large strawberries, 1 cup canned fruit,  cup dried fruit, or 1 cup  100% juice. Vegetables Aim to eat 2-3 cup-equivalents of fresh and frozen vegetables each day, including different varieties and colors. Examples of 1 cup-equivalent of vegetables include 2 medium carrots, 2 cups raw, leafy greens, 1 cup chopped vegetable (raw or cooked), or 1 medium baked potato. Grains Aim to eat 6 ounce-equivalents of whole grains each day. Examples of 1 ounce-equivalent of grains include 1 slice of bread, 1 cup ready-to-eat cereal, 3 cups popcorn, or  cup cooked rice, pasta, or cereal. Meats and other proteins Aim to eat 5-6 ounce-equivalents of protein each day. Examples of 1 ounce-equivalent of protein include 1 egg, 1/2 cup nuts or seeds, or 1 tablespoon (16 g) peanut butter. A cut of meat or fish that is the size of a deck of cards is about 3-4 ounce-equivalents.  Of the protein you eat each week, try to have at least 8 ounces come from seafood. This includes salmon, trout, herring, and anchovies. Dairy Aim to eat 3 cup-equivalents of fat-free or low-fat dairy each day. Examples of 1 cup-equivalent of dairy include 1 cup (240 mL) milk, 8 ounces (250 g) yogurt, 1 ounces (44 g) natural cheese, or 1 cup (240 mL) fortified soy milk. Fats and oils  Aim for about 5 teaspoons (21 g) per day. Choose monounsaturated fats, such as canola and olive oils, avocados, peanut butter, and most nuts, or polyunsaturated fats, such as sunflower, corn, and soybean oils, walnuts, pine nuts, sesame seeds, sunflower seeds, and flaxseed. Beverages  Aim for six 8-oz glasses of water per day. Limit coffee to three to five 8-oz cups per day.  Limit caffeinated beverages that have added calories, such as soda and energy drinks.  Limit alcohol intake to no more than 1 drink a day for nonpregnant women and 2 drinks a day for men. One drink equals 12 oz of beer (355 mL), 5 oz of wine (148 mL), or 1 oz of hard liquor (44 mL). Seasoning and other foods  Avoid adding excess amounts of salt to your  foods. Try flavoring foods with herbs and spices instead of salt.  Avoid adding sugar to foods.  Try using oil-based dressings, sauces, and spreads instead of solid fats. This information is based on general [place] nutrition guidelines. For more information, visit choosemyplate.gov. Exact amounts may vary based on your nutrition needs. Summary  A healthy eating plan may help you to maintain a healthy weight, reduce the risk of chronic diseases, and stay active throughout your life.  Plan your meals. Make sure you eat the right portions of a variety of nutrient-rich foods.  Try baking, boiling, grilling, or broiling instead of frying.  Choose healthy options in all settings, including home, work, school, restaurants, or stores. This information is not intended to replace advice given to you by your health care provider. Make sure you discuss any questions you have with your health care provider. Document Released: 04/26/2017 Document Revised: 04/26/2017 Document Reviewed: 04/26/2017 Elsevier Interactive Patient Education  2019 Elsevier Inc.  

## 2018-07-27 ENCOUNTER — Ambulatory Visit: Payer: Medicare HMO | Admitting: Family Medicine

## 2018-08-02 ENCOUNTER — Ambulatory Visit (INDEPENDENT_AMBULATORY_CARE_PROVIDER_SITE_OTHER): Payer: Medicare HMO | Admitting: Family Medicine

## 2018-08-02 ENCOUNTER — Encounter: Payer: Self-pay | Admitting: Family Medicine

## 2018-08-02 ENCOUNTER — Other Ambulatory Visit: Payer: Self-pay

## 2018-08-02 VITALS — BP 134/66 | HR 72 | Temp 98.4°F | Ht 67.0 in | Wt 166.4 lb

## 2018-08-02 DIAGNOSIS — I10 Essential (primary) hypertension: Secondary | ICD-10-CM

## 2018-08-02 DIAGNOSIS — C9110 Chronic lymphocytic leukemia of B-cell type not having achieved remission: Secondary | ICD-10-CM | POA: Diagnosis not present

## 2018-08-02 NOTE — Patient Instructions (Signed)
° ° ° °  If you have lab work done today you will be contacted with your lab results within the next 2 weeks.  If you have not heard from us then please contact us. The fastest way to get your results is to register for My Chart. ° ° °IF you received an x-ray today, you will receive an invoice from [facility]. Please contact [facility] at [phone] with questions or concerns regarding your invoice.  ° °IF you received labwork today, you will receive an invoice from [facility]. Please contact [facility] at [phone] with questions or concerns regarding your invoice.  ° °Our billing staff will not be able to assist you with questions regarding bills from these companies. ° °You will be contacted with the lab results as soon as they are available. The fastest way to get your results is to activate your My Chart account. Instructions are located on the last page of this paperwork. If you have not heard from us regarding the results in 2 weeks, please contact this office. °  ° ° ° °

## 2018-08-02 NOTE — Progress Notes (Signed)
Acute Office Visit  Subjective:    Patient ID: Tyler Wu, male    DOB: 1945-11-18, 73 y.o.   MRN: [id number]  Chief Complaint  Patient presents with  . Hypertension    F/u     HPI Patient is in today for concerns about elevated blood pressure. Pt states on 6/26-171/98 12:40pmm 150/85 pm-pt has taken bp at home at various times-off and on-.  08/01/18-140/74 10am, 7/6-145/83-stressful event in the family and working at home.  Pt co-signed for vehicle for grand-daughter. 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  CLL 2005 diagnosis. No symptoms other than night sweats. NO increase fatigue.    Past Medical History:  Diagnosis Date  . Allergy   . 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    Past Surgical History:  Procedure Laterality Date  . HERNIA REPAIR  1996    Family History  Problem Relation Age of Onset  . Cancer Father 23       melonoma  . Heart attack Maternal Grandmother   . Hypertension Maternal Grandmother   . Heart attack Maternal Grandfather   . Hypertension Maternal Grandfather     Social History   Socioeconomic History  . Marital status: Married    Spouse name: Not on file  . Number of children: 2  . Years of education: Not on file  . Highest education level: Associate degree: academic program  Occupational History  . Occupation: Haematologist Needs  . Financial resource strain: Not hard at all  . Food insecurity    Worry: Never true    Inability: Never true  . Transportation needs    Medical: No    Non-medical: No  Tobacco Use  . Smoking status: Former Smoker    Types: Cigarettes    Quit date: 01/26/1974    Years since quitting: 44.5  . Smokeless tobacco: Never Used  Substance and Sexual Activity  . Alcohol use: No    Frequency: Never  . Drug use: No  . Sexual activity: Yes    Birth control/protection: None    Comment: number of sex partners in the last 12 months  1   Lifestyle  . Physical activity    Days per week: 0 days    Minutes per session: 0 min  . Stress: Only a little  Relationships  . Social connections    Talks on phone: More than three times a week    Gets together: More than three times a week    Attends religious service: More than 4 times per year    Active member of club or organization: No    Attends meetings of clubs or organizations: Never    Relationship status: Married  . Intimate partner violence    Fear of current or ex partner: No    Emotionally abused: No    Physically abused: No    Forced sexual activity: No  Other Topics Concern  . Not on file  Social History Narrative  . Not on file    Outpatient Medications Prior to Visit  Medication Sig Dispense Refill  . aspirin 81 MG chewable tablet Chew 81 mg by mouth.    . loratadine (CLARITIN) 10 MG tablet Take 10 mg by mouth.     No facility-administered medications prior to visit.     Allergies  Allergen Reactions  . Levofloxacin Other (See Comments)    Bad dreams  Review of Systems  Constitutional: Positive for malaise/fatigue. Negative for chills and fever.  Cardiovascular: Negative for chest pain and palpitations.  Neurological: Negative for weakness and headaches.  CLL-hematology following     Objective:    Physical Exam  Constitutional: He is oriented to person, place, and time. He appears well-developed and well-nourished.  HENT:  Head: Normocephalic and atraumatic.  Right Ear: External ear normal.  Left Ear: External ear normal.  Nose: Nose normal.  Mouth/Throat: Oropharynx is clear and moist.  Eyes: Conjunctivae are normal.  Cardiovascular: Normal rate, regular rhythm, normal heart sounds and intact distal pulses.  Pulmonary/Chest: Effort normal and breath sounds normal.  Neurological: He is alert and oriented to person, place, and time.    BP (!) 164/77 (BP Location: Left Arm, Patient Position: Sitting, Cuff Size: Normal)   Pulse 72    Temp 98.4 F (36.9 C) (Oral)   Ht 5' 7"  (1.702 m)   Wt 166 lb 6.4 oz (75.5 kg)   SpO2 96%   BMI 26.06 kg/m  Wt Readings from Last 3 Encounters:  08/02/18 166 lb 6.4 oz (75.5 kg)  07/06/18 167 lb (75.8 kg)  04/14/18 168 lb 12.8 oz (76.6 kg)   No results found for: TSH Lab Results  Component Value Date   WBC 40.1 (HH) 04/07/2018   HGB 15.1 04/07/2018   HCT 45.7 04/07/2018   MCV 86 04/07/2018   PLT 203 04/07/2018   Lab Results  Component Value Date   NA 143 04/07/2018   K 4.0 04/07/2018   CHLORIDE 106 11/22/2014   CO2 20 04/07/2018   GLUCOSE 93 04/07/2018   BUN 11 04/07/2018   CREATININE 1.41 (H) 04/07/2018   BILITOT 0.4 04/07/2018   ALKPHOS 72 04/07/2018   AST 19 04/07/2018   ALT 19 04/07/2018   PROT 6.9 04/07/2018   ALBUMIN 4.9 (H) 04/07/2018   CALCIUM 9.4 04/07/2018   ANIONGAP 10 11/23/2017   EGFR 54 (L) 11/22/2014   Lab Results  Component Value Date   CHOL 241 (H) 04/07/2018   Lab Results  Component Value Date   HDL 57 04/07/2018   Lab Results  Component Value Date   LDLCALC 137 (H) 04/07/2018   Lab Results  Component Value Date   TRIG 235 (H) 04/07/2018   Lab Results  Component Value Date   CHOLHDL 4.2 04/07/2018   No results found for: HGBA1C     Assessment & Plan:   1. Hypertension, unspecified type D/w pt medication-goal 140/90-risk/benefit d/w pt-suggested lisinopril-pt will check bp at home and return with bp readings- - EKG 24-VHOY - Basic Metabolic Panel - POCT urinalysis dipstick - Urine Culture LISA Hannah Beat, MD

## 2018-08-03 LAB — BASIC METABOLIC PANEL
BUN/Creatinine Ratio: 9 — ABNORMAL LOW (ref 10–24)
BUN: 11 mg/dL (ref 8–27)
CO2: 21 mmol/L (ref 20–29)
Calcium: 9 mg/dL (ref 8.6–10.2)
Chloride: 106 mmol/L (ref 96–106)
Creatinine, Ser: 1.27 mg/dL (ref 0.76–1.27)
GFR calc Af Amer: 65 mL/min/{1.73_m2} (ref >59)
GFR calc non Af Amer: 56 mL/min/{1.73_m2} — ABNORMAL LOW (ref >59)
Glucose: 96 mg/dL (ref 65–99)
Potassium: 4.4 mmol/L (ref 3.5–5.2)
Sodium: 142 mmol/L (ref 134–144)

## 2018-08-03 LAB — URINALYSIS, MICROSCOPIC ONLY
Casts: NONE SEEN /lpf
Epithelial Cells (non renal): NONE SEEN /hpf (ref 0–10)

## 2018-08-03 LAB — URINE CULTURE

## 2018-08-05 NOTE — Progress Notes (Signed)
Pt is wanting to ask what med can he take for minor pain since the note reads to avoid anti inflammatory. He does take about 3 aleves a month he says.

## 2018-08-17 NOTE — Progress Notes (Signed)
Left a detail msg for patient on his machine for him to take tylenol instead of NSAIDS

## 2018-11-15 ENCOUNTER — Other Ambulatory Visit: Payer: Self-pay | Admitting: Hematology and Oncology

## 2018-11-15 DIAGNOSIS — C9110 Chronic lymphocytic leukemia of B-cell type not having achieved remission: Secondary | ICD-10-CM

## 2018-11-16 DIAGNOSIS — L570 Actinic keratosis: Secondary | ICD-10-CM | POA: Diagnosis not present

## 2018-11-16 DIAGNOSIS — L812 Freckles: Secondary | ICD-10-CM | POA: Diagnosis not present

## 2018-11-16 DIAGNOSIS — Z872 Personal history of diseases of the skin and subcutaneous tissue: Secondary | ICD-10-CM | POA: Diagnosis not present

## 2018-11-16 DIAGNOSIS — D1801 Hemangioma of skin and subcutaneous tissue: Secondary | ICD-10-CM | POA: Diagnosis not present

## 2018-11-16 DIAGNOSIS — L821 Other seborrheic keratosis: Secondary | ICD-10-CM | POA: Diagnosis not present

## 2018-11-16 DIAGNOSIS — D225 Melanocytic nevi of trunk: Secondary | ICD-10-CM | POA: Diagnosis not present

## 2018-11-24 ENCOUNTER — Encounter: Payer: Self-pay | Admitting: Hematology and Oncology

## 2018-11-24 ENCOUNTER — Telehealth: Payer: Self-pay | Admitting: Hematology and Oncology

## 2018-11-24 ENCOUNTER — Inpatient Hospital Stay: Payer: Medicare HMO | Attending: Hematology and Oncology

## 2018-11-24 ENCOUNTER — Inpatient Hospital Stay (HOSPITAL_BASED_OUTPATIENT_CLINIC_OR_DEPARTMENT_OTHER): Payer: Medicare HMO | Admitting: Hematology and Oncology

## 2018-11-24 ENCOUNTER — Other Ambulatory Visit: Payer: Self-pay

## 2018-11-24 DIAGNOSIS — Z7982 Long term (current) use of aspirin: Secondary | ICD-10-CM | POA: Diagnosis not present

## 2018-11-24 DIAGNOSIS — Z85828 Personal history of other malignant neoplasm of skin: Secondary | ICD-10-CM | POA: Insufficient documentation

## 2018-11-24 DIAGNOSIS — C9110 Chronic lymphocytic leukemia of B-cell type not having achieved remission: Secondary | ICD-10-CM

## 2018-11-24 LAB — CBC WITH DIFFERENTIAL/PLATELET
Abs Immature Granulocytes: 0.07 10*3/uL (ref 0.00–0.07)
Basophils Absolute: 0.1 10*3/uL (ref 0.0–0.1)
Basophils Relative: 0 %
Eosinophils Absolute: 0.2 10*3/uL (ref 0.0–0.5)
Eosinophils Relative: 1 %
HCT: 46.4 % (ref 39.0–52.0)
Hemoglobin: 14.8 g/dL (ref 13.0–17.0)
Immature Granulocytes: 0 %
Lymphocytes Relative: 83 %
Lymphs Abs: 28.9 10*3/uL — ABNORMAL HIGH (ref 0.7–4.0)
MCH: 28.7 pg (ref 26.0–34.0)
MCHC: 31.9 g/dL (ref 30.0–36.0)
MCV: 89.9 fL (ref 80.0–100.0)
Monocytes Absolute: 0.7 10*3/uL (ref 0.1–1.0)
Monocytes Relative: 2 %
Neutro Abs: 4.9 10*3/uL (ref 1.7–7.7)
Neutrophils Relative %: 14 %
Platelets: 202 10*3/uL (ref 150–400)
RBC: 5.16 MIL/uL (ref 4.22–5.81)
RDW: 13.8 % (ref 11.5–15.5)
WBC: 34.8 10*3/uL — ABNORMAL HIGH (ref 4.0–10.5)
nRBC: 0 % (ref 0.0–0.2)

## 2018-11-24 NOTE — Assessment & Plan Note (Signed)
He follows closely with dermatologist We discussed the importance of regular surveillance and avoidance of excessive sun exposure

## 2018-11-24 NOTE — Progress Notes (Signed)
[facility] OFFICE PROGRESS NOTE  Patient Care Team: Forrest Moron, MD as PCP - General (Internal Medicine) Heath Lark, MD as Consulting Physician (Hematology and Oncology)  ASSESSMENT & PLAN:  CLL (chronic lymphocytic leukemia) ([place]) Clinically, his blood work appears stable and he has no signs of clinical progression He has palpable left submandibular lymphadenopathy which has been stable I educated the patient signs and symptoms to watch out for disease progression He is up-to-date with influenza vaccination I will continue his yearly followup for CLL with history, physical examination and blood work.  History of skin cancer He follows closely with dermatologist We discussed the importance of regular surveillance and avoidance of excessive sun exposure   No orders of the defined types were placed in this encounter.   INTERVAL HISTORY: Please see below for problem oriented charting. He returns for CLL follow-up He is doing well No recent infection, fever or chills No recent progression of neck lymphadenopathy He is up-to-date with influenza vaccination He saw his dermatologist recently but denies recent diagnosis of recurrent skin cancer He is vigilant about wearing extra skin protection with longsleeve, hat and use sunscreen regularly and avoid excessive sun exposure  SUMMARY OF ONCOLOGIC HISTORY: Oncology History  CLL (chronic lymphocytic leukemia) ([place])  04/18/2003 Pathology Results   FLOW CYTOMETRIC ANALYSIS OF THE LYMPHOID POPULATION SHOWS A MONOCLONAL, LAMBDA RESTRICTED (DIM) B-CELL POPULATION EXPRESSING PAN B-CELL ANTIGENS INCLUDING CD20 AND CD23. THIS IS ASSOCIATED WITH COEXPRESSION OF CD5 AND CD20. NO CD10 EXPRESSION IS IDENTIFIED. THE FEATURES ARE COMPATIBLE WITH CHRONIC LYMPHOCYTIC  LEUKEMIA.   09/05/2003 Pathology Results   FLOW CYTOMETRY ANALYSIS OF THE CHRONIC LYMPHOCYTIC LEUKEMIA POPULATION SHOWS THAT APPROXIMATELY 18% OF THE CELLS ARE POSITIVE  FOR ZAP-70. AS 20% IS THE CUT OFF FOR POSITIVITY, THIS IS INTERPRETED AS A BORDERLINE/NEGATIVE RESULT   11/23/2018 Cancer Staging   Staging form: Chronic Lymphocytic Leukemia / Small Lymphocytic Lymphoma, AJCC 8th Edition - Clinical stage from 11/23/2018: Modified Rai Stage I (Modified Rai risk: Intermediate, Lymphocytosis: Present, Adenopathy: Present, Organomegaly: Absent, Anemia: Absent, Thrombocytopenia: Absent) - Signed by Heath Lark, MD on 11/23/2018     REVIEW OF SYSTEMS:   Constitutional: Denies fevers, chills or abnormal weight loss Eyes: Denies blurriness of vision Ears, nose, mouth, throat, and face: Denies mucositis or sore throat Respiratory: Denies cough, dyspnea or wheezes Cardiovascular: Denies palpitation, chest discomfort or lower extremity swelling Gastrointestinal:  Denies nausea, heartburn or change in bowel habits Skin: Denies abnormal skin rashes Lymphatics: Denies new lymphadenopathy or easy bruising Neurological:Denies numbness, tingling or new weaknesses Behavioral/Psych: Mood is stable, no new changes  All other systems were reviewed with the patient and are negative.  I have reviewed the past medical history, past surgical history, social history and family history with the patient and they are unchanged from previous note.  ALLERGIES:  is allergic to levofloxacin.  MEDICATIONS:  Current Outpatient Medications  Medication Sig Dispense Refill  . aspirin 81 MG chewable tablet Chew 81 mg by mouth.    . loratadine (CLARITIN) 10 MG tablet Take 10 mg by mouth.     No current facility-administered medications for this visit.     PHYSICAL EXAMINATION: ECOG PERFORMANCE STATUS: 0 - Asymptomatic  Vitals:   11/24/18 1241  BP: (!) 155/66  Pulse: 61  Resp: 18  Temp: 97.8 F (36.6 C)  SpO2: 99%   Filed Weights   11/24/18 1241  Weight: 161 lb 4.8 oz (73.2 kg)    GENERAL:alert, no distress and comfortable  SKIN: skin color, texture, turgor are normal, no  rashes or significant lesions EYES: normal, Conjunctiva are pink and non-injected, sclera clear OROPHARYNX:no exudate, no erythema and lips, buccal mucosa, and tongue normal  NECK: Previously palpable lymphadenopathy is stable at the submandibular region LYMPH:  no palpable lymphadenopathy in the cervical, axillary or inguinal LUNGS: clear to auscultation and percussion with normal breathing effort HEART: regular rate & rhythm and no murmurs and no lower extremity edema ABDOMEN:abdomen soft, non-tender and normal bowel sounds Musculoskeletal:no cyanosis of digits and no clubbing  NEURO: alert & oriented x 3 with fluent speech, no focal motor/sensory deficits  LABORATORY DATA:  I have reviewed the data as listed    Component Value Date/Time   NA 142 08/02/2018 1027   NA 140 11/22/2014 1320   K 4.4 08/02/2018 1027   K 3.8 11/22/2014 1320   CL 106 08/02/2018 1027   CL 107 11/20/2011 1011   CO2 21 08/02/2018 1027   CO2 24 11/22/2014 1320   GLUCOSE 96 08/02/2018 1027   GLUCOSE 91 11/23/2017 1233   GLUCOSE 154 (H) 11/22/2014 1320   GLUCOSE 87 11/20/2011 1011   BUN 11 08/02/2018 1027   BUN 10.5 11/22/2014 1320   CREATININE 1.27 08/02/2018 1027   CREATININE 1.29 (H) 03/14/2015 0841   CREATININE 1.3 11/22/2014 1320   CALCIUM 9.0 08/02/2018 1027   CALCIUM 9.1 11/22/2014 1320   PROT 6.9 04/07/2018 1055   PROT 6.4 11/22/2014 1320   ALBUMIN 4.9 (H) 04/07/2018 1055   ALBUMIN 4.1 11/22/2014 1320   AST 19 04/07/2018 1055   AST 13 11/22/2014 1320   ALT 19 04/07/2018 1055   ALT 14 11/22/2014 1320   ALKPHOS 72 04/07/2018 1055   ALKPHOS 67 11/22/2014 1320   BILITOT 0.4 04/07/2018 1055   BILITOT 0.64 11/22/2014 1320   GFRNONAA 56 (L) 08/02/2018 1027   GFRAA 65 08/02/2018 1027    No results found for: SPEP, UPEP  Lab Results  Component Value Date   WBC 34.8 (H) 11/24/2018   NEUTROABS 4.9 11/24/2018   HGB 14.8 11/24/2018   HCT 46.4 11/24/2018   MCV 89.9 11/24/2018   PLT 202  11/24/2018      Chemistry      Component Value Date/Time   NA 142 08/02/2018 1027   NA 140 11/22/2014 1320   K 4.4 08/02/2018 1027   K 3.8 11/22/2014 1320   CL 106 08/02/2018 1027   CL 107 11/20/2011 1011   CO2 21 08/02/2018 1027   CO2 24 11/22/2014 1320   BUN 11 08/02/2018 1027   BUN 10.5 11/22/2014 1320   CREATININE 1.27 08/02/2018 1027   CREATININE 1.29 (H) 03/14/2015 0841   CREATININE 1.3 11/22/2014 1320      Component Value Date/Time   CALCIUM 9.0 08/02/2018 1027   CALCIUM 9.1 11/22/2014 1320   ALKPHOS 72 04/07/2018 1055   ALKPHOS 67 11/22/2014 1320   AST 19 04/07/2018 1055   AST 13 11/22/2014 1320   ALT 19 04/07/2018 1055   ALT 14 11/22/2014 1320   BILITOT 0.4 04/07/2018 1055   BILITOT 0.64 11/22/2014 1320      All questions were answered. The patient knows to call the clinic with any problems, questions or concerns. No barriers to learning was detected.  I spent 10 minutes counseling the patient face to face. The total time spent in the appointment was 15 minutes and more than 50% was on counseling and review of test results  Heath Lark, MD 11/24/2018  1:02 PM

## 2018-11-24 NOTE — Assessment & Plan Note (Signed)
Clinically, his blood work appears stable and he has no signs of clinical progression He has palpable left submandibular lymphadenopathy which has been stable I educated the patient signs and symptoms to watch out for disease progression He is up-to-date with influenza vaccination I will continue his yearly followup for CLL with history, physical examination and blood work. 

## 2018-11-24 NOTE — Telephone Encounter (Signed)
Scheduled appt per 10/29 sch message - mailed reminder with appt date and time

## 2019-01-27 DIAGNOSIS — Z8616 Personal history of COVID-19: Secondary | ICD-10-CM

## 2019-01-27 HISTORY — DX: Personal history of COVID-19: Z86.16

## 2019-02-13 ENCOUNTER — Encounter: Payer: Self-pay | Admitting: Hematology and Oncology

## 2019-03-06 DIAGNOSIS — H2511 Age-related nuclear cataract, right eye: Secondary | ICD-10-CM | POA: Diagnosis not present

## 2019-03-06 DIAGNOSIS — H2512 Age-related nuclear cataract, left eye: Secondary | ICD-10-CM | POA: Diagnosis not present

## 2019-03-06 DIAGNOSIS — H5213 Myopia, bilateral: Secondary | ICD-10-CM | POA: Diagnosis not present

## 2019-03-06 DIAGNOSIS — H3562 Retinal hemorrhage, left eye: Secondary | ICD-10-CM | POA: Diagnosis not present

## 2019-03-12 ENCOUNTER — Ambulatory Visit: Payer: Medicare HMO

## 2019-03-13 ENCOUNTER — Other Ambulatory Visit: Payer: Self-pay

## 2019-03-13 ENCOUNTER — Ambulatory Visit: Payer: Medicare HMO | Attending: Internal Medicine

## 2019-03-13 DIAGNOSIS — Z23 Encounter for immunization: Secondary | ICD-10-CM

## 2019-03-13 NOTE — Progress Notes (Signed)
   Covid-19 Vaccination Clinic  Name:  Tyler Wu    MRN: [id number] DOB: 01-06-1946  03/13/2019  [name] was observed post Covid-19 immunization for 15 minutes without incidence. He was provided with Vaccine Information Sheet and instruction to access the V-Safe system.   [name] was instructed to call 911 with any severe reactions post vaccine: Marland Kitchen Difficulty breathing  . Swelling of your face and throat  . A fast heartbeat  . A bad rash all over your body  . Dizziness and weakness    Immunizations Administered    Name Date Dose VIS Date Route   Moderna COVID-19 Vaccine 03/13/2019 10:43 AM 0.5 mL 12/27/2018 Intramuscular   Manufacturer: Moderna   Lot: [id number]   [id number]

## 2019-03-23 ENCOUNTER — Telehealth: Payer: Medicare HMO | Admitting: Physician Assistant

## 2019-03-23 DIAGNOSIS — J069 Acute upper respiratory infection, unspecified: Secondary | ICD-10-CM | POA: Diagnosis not present

## 2019-03-23 NOTE — Progress Notes (Signed)
E-Visit for [employer] Virus Screening  Your current symptoms could be consistent with the coronavirus or the flu or another virus.  Many health care providers can now test patients at their office but not all are.  [place] has multiple testing sites. For information on our COVID testing locations and hours go to [email or web address]  It is recommended you get tested prior to being around any other individual. If your results are positive you need to isolate. It is also recommended that you still stay away from other individuals while you are symptomatic even if your results are negative.   We are enrolling you in our [place] for [place] . Daily you will receive a questionnaire within the [place] website. Our COVID 19 response team will be monitoring your responses daily.  Testing Information: The COVID-19 Community Testing sites will begin testing BY APPOINTMENT ONLY.  You can schedule online at [email or web address]  If you do not have access to a smart phone or computer you may call [phone] for an appointment.   Additional testing sites in the Community:  . For [facility] Testing sites in [facility]  FaceUpdate.uy  . For Pop-up testing sites in [place]  BowlDirectory.co.uk  . For Testing sites with regular hours [email or web address].org/[email or web address]  . For [place] [email or web address]  . For [facility] [email or web address]  . For [facility] testing in [place] and [employer] [email or web address]  . For [facility] testing in [facility]   [email or web address]  For  more  information about community testing call [phone]   Please quarantine yourself while awaiting your test results. Please stay home for a minimum of 10 days from the first day of illness with improving symptoms and you have had 24 hours of no fever (without the use of Tylenol (Acetaminophen) Motrin (Ibuprofen) or any fever reducing medication).  Also - Do not get tested prior to returning to work because once you have had a positive test the test can stay positive for more then a month in some cases.   You should wear a mask or cloth face covering over your nose and mouth if you must be around other people or animals, including pets (even at home). Try to stay at least 6 feet away from other people. This will protect the people around you.  Please continue good preventive care measures, including:  frequent hand-washing, avoid touching your face, cover coughs/sneezes, stay out of crowds and keep a 6 foot distance from others.  COVID-19 is a respiratory illness with symptoms that are similar to the flu. Symptoms are typically mild to moderate, but there have been cases of severe illness and death due to the virus.   The following symptoms may appear 2-14 days after exposure: . Fever . Cough . Shortness of breath or difficulty breathing . Chills . Repeated shaking with chills . Muscle pain . Headache . Sore throat . New loss of taste or smell . Fatigue . Congestion or runny nose . Nausea or vomiting . Diarrhea  Go to the nearest hospital ED for assessment if fever/cough/breathlessness are severe or illness seems like a threat to life.  It is vitally important that if you feel that you have an infection such as this virus or any other virus that you stay home and away from places where you may spread it to others.  You should avoid contact with people age 65 and older.     You may also take acetaminophen (Tylenol) as needed for  fever.  Reduce your risk of any infection by using the same  precautions used for avoiding the common cold or flu:  Marland Kitchen Wash your hands often with soap and warm water for at least 20 seconds.  If soap and water are not readily available, use an alcohol-based hand sanitizer with at least 60% alcohol.  . If coughing or sneezing, cover your mouth and nose by coughing or sneezing into the elbow areas of your shirt or coat, into a tissue or into your sleeve (not your hands). . Avoid shaking hands with others and consider head nods or verbal greetings only. . Avoid touching your eyes, nose, or mouth with unwashed hands.  . Avoid close contact with people who are sick. . Avoid places or events with large numbers of people in one location, like concerts or sporting events. . Carefully consider travel plans you have or are making. . If you are planning any travel outside or inside the [place], visit the CDC's Travelers' Health webpage for the latest health notices. . If you have some symptoms but not all symptoms, continue to monitor at home and seek medical attention if your symptoms worsen. . If you are having a medical emergency, call 911.  HOME CARE . Only take medications as instructed by your medical team. . Drink plenty of fluids and get plenty of rest. . A steam or ultrasonic humidifier can help if you have congestion.   GET HELP RIGHT AWAY IF YOU HAVE EMERGENCY WARNING SIGNS** FOR COVID-19. If you or someone is showing any of these signs seek emergency medical care immediately. Call 911 or proceed to your closest emergency facility if: . You develop worsening high fever. . Trouble breathing . Bluish lips or face . Persistent pain or pressure in the chest . New confusion . Inability to wake or stay awake . You cough up blood. . Your symptoms become more severe  **This list is not all possible symptoms. Contact your medical provider for any symptoms that are sever or concerning to you.  MAKE SURE YOU   Understand these instructions.  Will watch your  condition.  Will get help right away if you are not doing well or get worse.  Your e-visit answers were reviewed by a board certified advanced clinical practitioner to complete your personal care plan.  Depending on the condition, your plan could have included both over the counter or prescription medications.  If there is a problem please reply once you have received a response from your provider.  Your safety is important to [place].  If you have drug allergies check your prescription carefully.    You can use MyChart to ask questions about today's visit, request a non-urgent call back, or ask for a work or school excuse for 24 hours related to this e-Visit. If it has been greater than 24 hours you will need to follow up with your provider, or enter a new e-Visit to address those concerns. You will get an e-mail in the next two days asking about your experience.  I hope that your e-visit has been valuable and will speed your recovery. Thank you for using e-visits.  5 minutes spent on chart

## 2019-03-27 DIAGNOSIS — R05 Cough: Secondary | ICD-10-CM | POA: Diagnosis not present

## 2019-03-27 DIAGNOSIS — R519 Headache, unspecified: Secondary | ICD-10-CM | POA: Diagnosis not present

## 2019-03-27 DIAGNOSIS — J0140 Acute pansinusitis, unspecified: Secondary | ICD-10-CM | POA: Diagnosis not present

## 2019-03-27 DIAGNOSIS — R0989 Other specified symptoms and signs involving the circulatory and respiratory systems: Secondary | ICD-10-CM | POA: Diagnosis not present

## 2019-03-27 DIAGNOSIS — Z20822 Contact with and (suspected) exposure to covid-19: Secondary | ICD-10-CM | POA: Diagnosis not present

## 2019-03-27 DIAGNOSIS — R0981 Nasal congestion: Secondary | ICD-10-CM | POA: Diagnosis not present

## 2019-04-11 ENCOUNTER — Ambulatory Visit: Payer: Medicare HMO | Attending: Internal Medicine

## 2019-04-11 DIAGNOSIS — Z23 Encounter for immunization: Secondary | ICD-10-CM

## 2019-04-11 NOTE — Progress Notes (Signed)
   Covid-19 Vaccination Clinic  Name:  Tyler Wu    MRN: [id number] DOB: [birth year]  04/11/2019  [name] was observed post Covid-19 immunization for 15 minutes without incident. He was provided with Vaccine Information Sheet and instruction to access the V-Safe system.   [name] was instructed to call 911 with any severe reactions post vaccine: Marland Kitchen Difficulty breathing  . Swelling of face and throat  . A fast heartbeat  . A bad rash all over body  . Dizziness and weakness   Immunizations Administered    Name Date Dose VIS Date Route   Moderna COVID-19 Vaccine 04/11/2019 10:38 AM 0.5 mL 12/27/2018 Intramuscular   Manufacturer: Moderna   Lot: [id number]   [place]:[id number]

## 2019-06-06 DIAGNOSIS — Z872 Personal history of diseases of the skin and subcutaneous tissue: Secondary | ICD-10-CM | POA: Diagnosis not present

## 2019-06-06 DIAGNOSIS — L820 Inflamed seborrheic keratosis: Secondary | ICD-10-CM | POA: Diagnosis not present

## 2019-06-06 DIAGNOSIS — L905 Scar conditions and fibrosis of skin: Secondary | ICD-10-CM | POA: Diagnosis not present

## 2019-10-18 DIAGNOSIS — U071 COVID-19: Secondary | ICD-10-CM | POA: Diagnosis not present

## 2019-10-20 DIAGNOSIS — R438 Other disturbances of smell and taste: Secondary | ICD-10-CM | POA: Insufficient documentation

## 2019-10-26 DIAGNOSIS — Z856 Personal history of leukemia: Secondary | ICD-10-CM | POA: Diagnosis not present

## 2019-10-26 DIAGNOSIS — U071 COVID-19: Secondary | ICD-10-CM | POA: Diagnosis not present

## 2019-10-26 DIAGNOSIS — R61 Generalized hyperhidrosis: Secondary | ICD-10-CM | POA: Diagnosis not present

## 2019-10-26 DIAGNOSIS — R05 Cough: Secondary | ICD-10-CM | POA: Diagnosis not present

## 2019-10-26 DIAGNOSIS — R0902 Hypoxemia: Secondary | ICD-10-CM | POA: Diagnosis not present

## 2019-10-26 DIAGNOSIS — J069 Acute upper respiratory infection, unspecified: Secondary | ICD-10-CM | POA: Diagnosis not present

## 2019-10-27 DIAGNOSIS — I700 Atherosclerosis of aorta: Secondary | ICD-10-CM

## 2019-10-27 HISTORY — DX: Atherosclerosis of aorta: I70.0

## 2019-10-30 DIAGNOSIS — J069 Acute upper respiratory infection, unspecified: Secondary | ICD-10-CM | POA: Diagnosis not present

## 2019-10-30 DIAGNOSIS — U071 COVID-19: Secondary | ICD-10-CM | POA: Diagnosis not present

## 2019-10-30 DIAGNOSIS — R899 Unspecified abnormal finding in specimens from other organs, systems and tissues: Secondary | ICD-10-CM | POA: Diagnosis not present

## 2019-10-30 DIAGNOSIS — R7301 Impaired fasting glucose: Secondary | ICD-10-CM | POA: Diagnosis not present

## 2019-10-31 DIAGNOSIS — R61 Generalized hyperhidrosis: Secondary | ICD-10-CM | POA: Diagnosis not present

## 2019-10-31 DIAGNOSIS — J069 Acute upper respiratory infection, unspecified: Secondary | ICD-10-CM | POA: Diagnosis not present

## 2019-10-31 DIAGNOSIS — R7989 Other specified abnormal findings of blood chemistry: Secondary | ICD-10-CM | POA: Diagnosis not present

## 2019-10-31 DIAGNOSIS — U071 COVID-19: Secondary | ICD-10-CM | POA: Diagnosis not present

## 2019-10-31 DIAGNOSIS — J189 Pneumonia, unspecified organism: Secondary | ICD-10-CM | POA: Diagnosis not present

## 2019-10-31 DIAGNOSIS — I700 Atherosclerosis of aorta: Secondary | ICD-10-CM | POA: Diagnosis not present

## 2019-10-31 DIAGNOSIS — J9811 Atelectasis: Secondary | ICD-10-CM | POA: Diagnosis not present

## 2019-11-02 ENCOUNTER — Telehealth: Payer: Self-pay | Admitting: Medical

## 2019-11-02 NOTE — Telephone Encounter (Signed)
Leave it as in person visit

## 2019-11-02 NOTE — Telephone Encounter (Signed)
This pt is scheduled to come in tomorrow for a new pt. He had covid on 9-22. He has developed pneumonia and has been on antibiotics that is currently running out. He has SOB still and diarrhea that he thinks is coming from the antibiotic. My question is should I change his appt to virtual, push the appt back a week or 2 or leave him on for tomorrow to come in the office?

## 2019-11-03 ENCOUNTER — Other Ambulatory Visit: Payer: Self-pay

## 2019-11-03 ENCOUNTER — Ambulatory Visit (INDEPENDENT_AMBULATORY_CARE_PROVIDER_SITE_OTHER): Payer: Medicare HMO | Admitting: Medical

## 2019-11-03 ENCOUNTER — Encounter: Payer: Self-pay | Admitting: Medical

## 2019-11-03 VITALS — BP 130/78 | HR 78 | Temp 98.4°F | Wt 148.4 lb

## 2019-11-03 DIAGNOSIS — C9110 Chronic lymphocytic leukemia of B-cell type not having achieved remission: Secondary | ICD-10-CM

## 2019-11-03 DIAGNOSIS — U071 COVID-19: Secondary | ICD-10-CM | POA: Diagnosis not present

## 2019-11-03 DIAGNOSIS — R0602 Shortness of breath: Secondary | ICD-10-CM | POA: Insufficient documentation

## 2019-11-03 DIAGNOSIS — I700 Atherosclerosis of aorta: Secondary | ICD-10-CM | POA: Diagnosis not present

## 2019-11-03 MED ORDER — ALBUTEROL SULFATE HFA 108 (90 BASE) MCG/ACT IN AERS: 2.0000 | INHALATION_SPRAY | Freq: Four times a day (QID) | RESPIRATORY_TRACT | 0 refills | Status: DC | PRN

## 2019-11-03 NOTE — Progress Notes (Signed)
New patient visit   Patient: Tyler Wu   DOB: 1946/01/24   74 y.o. Male  MRN: [id number] Visit Date: 11/03/2019  Today's healthcare provider: Dorothea Ogle, PA-C  Care Team Oncology: Dr.Ni Gorsuch   Chief Complaint  Patient presents with  . other    new pt. acute covid positive on 10/18/19 had pnumonia SOB, diarrhea from medicine  I,Porsha McClurkin,CMA,acting as a scribe for [employer], PA-C.,have documented all relevant documentation on his behalf,as directed and in the presence of [employer].  Subjective    Tyler Wu is a 74 y.o. male who presents today as a new patient to establish care.  HPI HPI    other     Additional comments: new pt. acute covid positive on 10/18/19 had pnumonia SOB, diarrhea from medicine       Last edited by Rhona Leavens, RMA on 11/03/2019 10:06 AM. (History)     Here to establish care today.  Was seeing [facility] urgent care prior  He sees Dr. Alvy Bimler for oncology regularly.   Patient was COVID positive on 10/18/2019.  Initial symptoms included cough, body aches, malaise, loss of taste and smell, shortness of breath, but now his main symptoms is still a little short of breath and fatigue.  His taste and smell has for the most part has returned.  He had went to urgent care at 1 point and was initially told to use supportive care without any specific treatment.  However he was eventually was put on Z-Pak, Singulair, codeine cough syrup and dexamethasone steroid pills. he never received IV infusion  He does note a history of underlying leukemia since 2005.  This has been stable per his report.    His main concern today is whether he needs to take any other treatment.  He does feel much better in terms of breathing and is gradually regaining his strength.  Regarding CT scan finding of chest of atherosclerosis, he is not currently on cholesterol medicine.  He was not aware of this finding.  Had stress test in 2009 and the doctor  advised him to started taking a baby Asprin.     Past Medical History:  Diagnosis Date  . Allergy   . 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   Past Surgical History:  Procedure Laterality Date  . [place]   Family Status  Relation Name Status  . Father  Deceased  . MGM  Deceased  . MGF  Deceased  . Mother  Deceased  . PGM  Deceased  . PGF  Deceased  . Brother  Alive  . Daughter  Alive  . Son  Alive   Family History  Problem Relation Age of Onset  . Cancer Father 65       melonoma  . Heart attack Maternal Grandmother   . Hypertension Maternal Grandmother   . Heart attack Maternal Grandfather   . Hypertension Maternal Grandfather    Social History   Socioeconomic History  . Marital status: Married    Spouse name: Not on file  . Number of children: 2  . Years of education: Not on file  . Highest education level: Associate degree: academic program  Occupational History  . Occupation: Geologist, engineering  Tobacco Use  . Smoking status: Former Smoker    Types: Cigarettes    Quit date: 01/26/1974    Years since quitting: 45.8  . Smokeless tobacco: Never Used  Vaping Use  .  Vaping Use: Never used  Substance and Sexual Activity  . Alcohol use: No  . Drug use: No  . Sexual activity: Yes    Birth control/protection: None    Comment: number of sex partners in the last 45 months  1  Other Topics Concern  . Not on file  Social History Narrative  . Not on file   Social Determinants of Health   Financial Resource Strain:   . Difficulty of Paying Living Expenses: Not on file  Food Insecurity:   . Worried About Charity fundraiser in the Last Year: Not on file  . [place] Out of Food in the Last Year: Not on file  Transportation Needs:   . Lack of Transportation (Medical): Not on file  . Lack of Transportation (Non-Medical): Not on file  Physical Activity:   . Days of Exercise per Week: Not on file  . Minutes of Exercise per  Session: Not on file  Stress:   . Feeling of Stress : Not on file  Social Connections:   . Frequency of Communication with Friends and Family: Not on file  . Frequency of Social Gatherings with Friends and Family: Not on file  . Attends Religious Services: Not on file  . Active Member of Clubs or Organizations: Not on file  . Attends Archivist Meetings: Not on file  . Marital Status: Not on file   Outpatient Medications Prior to Visit  Medication Sig Note  . ascorbic acid (VITAMIN C) 500 MG tablet Take 500 mg by mouth daily.   Marland Kitchen aspirin 81 MG chewable tablet Chew 81 mg by mouth.   . cholecalciferol (VITAMIN D3) 25 MCG (1000 UNIT) tablet Take 1,000 Units by mouth daily. 11/03/2019: 2,000 units  . dexamethasone (DECADRON) 6 MG tablet Take 6 mg by mouth daily.   Marland Kitchen loratadine (CLARITIN) 10 MG tablet Take 10 mg by mouth.   . montelukast (SINGULAIR) 10 MG tablet Take 10 mg by mouth at bedtime.   . ondansetron (ZOFRAN-ODT) 8 MG disintegrating tablet Take 8 mg by mouth every 8 (eight) hours as needed for nausea or vomiting.   . vitamin B-12 (CYANOCOBALAMIN) 1000 MCG tablet Take 1,000 mcg by mouth daily. 11/03/2019: 2,500 mcg  . zinc gluconate 50 MG tablet Take 50 mg by mouth daily.    No facility-administered medications prior to visit.   Allergies  Allergen Reactions  . Levofloxacin Other (See Comments)    Bad dreams     Immunization History  Administered Date(s) Administered  . Influenza Split 11/11/2013, 10/27/2014, 11/10/2017  . Influenza-Unspecified 11/11/2013, 10/31/2015, 10/26/2016  . Moderna SARS-COVID-2 Vaccination 03/13/2019, 04/11/2019  . Pneumococcal Conjugate-13 03/30/2017    Health Maintenance  Topic Date Due  . PNA vac Low Risk Adult (2 of 2 - PPSV23) 03/31/2018  . COLONOSCOPY  06/08/2019  . TETANUS/TDAP  06/08/2019  . COVID-19 Vaccine  Completed  . Hepatitis C Screening  Completed    Patient Care Team: Durga Saldarriaga, Camelia Eng, PA-C as PCP - General (Family  Medicine) Heath Lark, MD as Consulting Physician (Hematology and Oncology)  Review of Systems As in subjective   Objective    BP 130/78   Pulse 78   Temp 98.4 F (36.9 C)   Wt 148 lb 6.4 oz (67.3 kg)   SpO2 96%   BMI 23.24 kg/m  Physical Exam Constitutional:      Appearance: Normal appearance. He is normal weight.  HENT:     Nose: Nose normal.  Mouth/Throat:     Mouth: Mucous membranes are moist.     Pharynx: Oropharynx is clear. No oropharyngeal exudate or posterior oropharyngeal erythema.  Neck:     Vascular: No carotid bruit.  Cardiovascular:     Rate and Rhythm: Normal rate and regular rhythm.     Pulses: Normal pulses.     Heart sounds: Normal heart sounds. No murmur heard.  No gallop.   Pulmonary:     Effort: Pulmonary effort is normal. No respiratory distress.     Breath sounds: Normal breath sounds. No stridor. No wheezing, rhonchi or rales.  Musculoskeletal:     Cervical back: Neck supple. No tenderness.  Lymphadenopathy:     Cervical: No cervical adenopathy.  Skin:    General: Skin is warm and dry.  Neurological:     General: No focal deficit present.     Mental Status: He is alert and oriented to person, place, and time.  Psychiatric:        Mood and Affect: Mood normal.        Behavior: Behavior normal.    Depression Screen PHQ 2/9 Scores 08/02/2018 07/06/2018 04/14/2018 04/07/2018  PHQ - 2 Score 0 0 0 0       Assessment & Plan   Encounter Diagnoses  Name Primary?  . COVID-19 virus infection Yes  . SOB (shortness of breath)   . Aortic atherosclerosis ([place])   . CLL (chronic lymphocytic leukemia) ([place])        I reviewed his health history.  I reviewed his recent hospital records from October 31, 2019 in care everywhere from [facility] including chest CT, labs and other reports and documentation  Overall he seems to be significantly improved.  Lungs are clear today.  Vital signs are good.  He has already completed a course of Z-Pak, and is  still using codeine cough syrup dexamethasone steroid and Singulair  At this point I advised he continue to supplements he is taking of Vitamin C, vitamin D and zinc.  Continue cough syrup as needed, finish out dexamethasone  He has follow-up with oncology at the end of this month.  At that time it may be worth doing a chest x-ray and I assume he will have additional blood work then.  if not he can come back here for recheck  He can try albuterol sent out today to help open up the airspace.  Continue to rest and hydrate well  I have asked the come in for regular physical/we will check soon.    We did discuss aortic atherosclerosis noted on CT chest.  I recommended he consider statin for primary prevention.  He wants to think about this first.  We did discuss diet as it relates to the atherosclerosis.  If he starts to get worse again in the short-term then get reevaluated right away   Tramane was seen today for other.  Diagnoses and all orders for this visit:  COVID-19 virus infection  SOB (shortness of breath)  Aortic atherosclerosis (H[facility])  CLL (chronic lymphocytic leukemia) ([place])  Other orders -     albuterol (VENTOLIN HFA) 108 (90 Base) MCG/ACT inhaler; Inhale 2 puffs into the lungs every 6 (six) hours as needed for wheezing or shortness of breath.  f/u in a month for physical/well visit

## 2019-11-13 ENCOUNTER — Encounter: Payer: Self-pay | Admitting: Medical

## 2019-11-13 ENCOUNTER — Telehealth (INDEPENDENT_AMBULATORY_CARE_PROVIDER_SITE_OTHER): Payer: Medicare HMO | Admitting: Medical

## 2019-11-13 ENCOUNTER — Other Ambulatory Visit: Payer: Self-pay

## 2019-11-13 VITALS — Ht 67.0 in | Wt 148.0 lb

## 2019-11-13 DIAGNOSIS — C9110 Chronic lymphocytic leukemia of B-cell type not having achieved remission: Secondary | ICD-10-CM

## 2019-11-13 DIAGNOSIS — R059 Cough, unspecified: Secondary | ICD-10-CM | POA: Insufficient documentation

## 2019-11-13 DIAGNOSIS — R430 Anosmia: Secondary | ICD-10-CM | POA: Diagnosis not present

## 2019-11-13 DIAGNOSIS — J3489 Other specified disorders of nose and nasal sinuses: Secondary | ICD-10-CM

## 2019-11-13 DIAGNOSIS — Z8616 Personal history of COVID-19: Secondary | ICD-10-CM | POA: Diagnosis not present

## 2019-11-13 MED ORDER — AMOXICILLIN 875 MG PO TABS: 875.0000 mg | ORAL_TABLET | Freq: Two times a day (BID) | ORAL | 0 refills | Status: DC

## 2019-11-13 NOTE — Progress Notes (Signed)
MyChart Video Visit    Virtual Visit via Video Note   This visit type was conducted due to national recommendations for restrictions regarding the COVID-19 Pandemic (e.g. social distancing) in an effort to limit this patient's exposure and mitigate transmission in our community. This patient is at least at moderate risk for complications without adequate follow up. This format is felt to be most appropriate for this patient at this time. Physical exam was limited by quality of the video and audio technology used for the visit.   Patient location: home Provider location: office  I discussed the limitations of evaluation and management by telemedicine and the availability of in person appointments. The patient expressed understanding and agreed to proceed.  Patient: Tyler Wu   DOB: 10-27-1945   74 y.o. Male  MRN: [id number] Visit Date: 11/13/2019  Today's healthcare provider: Dorothea Ogle, PA-C   Chief Complaint  Patient presents with  . Cough    with congestion and small bit of fatigue    Subjective    HPI HPI    Cough     Additional comments: with congestion and small bit of fatigue        Last edited by Edgar Frisk, CMA on 11/13/2019 10:36 AM. (History)      Virtual consult.  He establish care for care back on October 8.  He recently had been diagnosed with Covid back in September.  See recent visit notes.  He did have a variety of symptoms initially including loss of smell and taste.  He continues to have loss of smell and taste.  Last visit I prescribed an inhaler to use for shortness of breath but he has not used this.  Upper respiratory symptoms He complains of cough and congestion. .with low grade fever on last Friday but has resolved. Onset of symptoms was a few days ago and gradually improving.He is drinking plenty of fluids.  Past history is significant for no history of pneumonia or bronchitis. Patient is not a current smoker. Patient was diagnosed with  COVID 10/18/19 and has not had smell or taste back.    --------------------------------------------------------------------------------------------------- Had 101.6 temp on Friday 4 days ago.  Using Dayquil and Acetaminophen. This morning 98.9 temp. no has cough..  This morning had blood mixed in with mucous from nasal pressure.  Otherwise, still can't taste or smell from being covid +.   Appetite comes and goes, although it seems to be coming back.   Still eating chicken noodle soup, chicken and rice.   Energy is improving since last week  No other aggravating or relieving factors. No other complaint.  Medications: Outpatient Medications Prior to Visit  Medication Sig  . ascorbic acid (VITAMIN C) 500 MG tablet Take 500 mg by mouth daily.  Marland Kitchen aspirin 81 MG chewable tablet Chew 81 mg by mouth.  . cholecalciferol (VITAMIN D3) 25 MCG (1000 UNIT) tablet Take 1,000 Units by mouth daily.  . vitamin B-12 (CYANOCOBALAMIN) 1000 MCG tablet Take 1,000 mcg by mouth daily.  Marland Kitchen zinc gluconate 50 MG tablet Take 50 mg by mouth daily.  Marland Kitchen albuterol (VENTOLIN HFA) 108 (90 Base) MCG/ACT inhaler Inhale 2 puffs into the lungs every 6 (six) hours as needed for wheezing or shortness of breath. (Patient not taking: Reported on 11/13/2019)  . dexamethasone (DECADRON) 6 MG tablet Take 6 mg by mouth daily. (Patient not taking: Reported on 11/13/2019)  . loratadine (CLARITIN) 10 MG tablet Take 10 mg by mouth. (Patient not taking: Reported  on 11/13/2019)  . montelukast (SINGULAIR) 10 MG tablet Take 10 mg by mouth at bedtime. (Patient not taking: Reported on 11/13/2019)  . ondansetron (ZOFRAN-ODT) 8 MG disintegrating tablet Take 8 mg by mouth every 8 (eight) hours as needed for nausea or vomiting. (Patient not taking: Reported on 11/13/2019)   No facility-administered medications prior to visit.    Review of Systems As in subjective   Objective    Ht 5\' 7"  (1.702 m)   Wt 148 lb (67.1 kg)   SpO2 95%   BMI 23.18  kg/m  81 pulse  96%  Physical Exam  Gen: wd, wn, nad Otherwise not examined as this was a virtual consult     Assessment & Plan   Encounter Diagnoses  Name Primary?  . Sinus pressure Yes  . History of COVID-19   . CLL (chronic lymphocytic leukemia) ([place])   . Cough   . Loss of smell        We discussed his symptoms and concerns.  He is still recovering from Covid infection starting in late September.  He initially completed a round of Z-Pak, Singulair, dexamethasone steroid and codeine cough syrup.  Given the ongoing fever and blood-tinged mucus from the sinus congestion over the weekend we will do a round of amoxicillin.  I encouraged him to use the albuterol prescribed last visit if coughing fits or shortness of breath.  Continue to rest, drink plenty of water, continue DayQuil NyQuil combo for symptoms.  Advise if not improving over the course of this week or worse let me know right away  He does see his oncologist later this month and will have labs drawn there and follow-up given history of CLL  Advised that the sense of smell and taste and fatigue will take time to improve   Tyler Wu was seen today for cough.  Diagnoses and all orders for this visit:  Sinus pressure  History of COVID-19  CLL (chronic lymphocytic leukemia) (HCC)  Cough  Loss of smell  Other orders -     amoxicillin (AMOXIL) 875 MG tablet; Take 1 tablet (875 mg total) by mouth 2 (two) times daily.    I discussed the assessment and treatment plan with the patient. The patient was provided an opportunity to ask questions and all were answered. The patient agreed with the plan and demonstrated an understanding of the instructions.   The patient was advised to call back or seek an in-person evaluation if the symptoms worsen or if the condition fails to improve as anticipated.  I provided 15 minutes of non-face-to-face time during this encounter.  Dorothea Ogle, PA-C [facility] [phone] (phone) [phone] (fax)  [place]

## 2019-11-14 ENCOUNTER — Telehealth: Payer: Self-pay | Admitting: Medical

## 2019-11-14 NOTE — Telephone Encounter (Signed)
Has a follow-up tomorrow phone call, I reached out to an infectious disease specialist through consult (RUbicon MD) about his case  They actually recommended some additional testing and advice.  Given his current symptoms they recommended we repeat the PCR nasal swab to see if they are still showing virus shedding in the nose.  If so they would recommend referral for monoclonal antibody infusion therapy now.  So if agreeable let us get him in for testing tomorrow morning.  They also recommend a repeat chest x-ray which we can do tomorrow at [facility].  They recommended potentially repeating D-dimer if there was any concern for blood clot in the lung, however if his pulse rate is normal and if not overly short of breath I am not so sure we need to pursue this given the recent chest CT, but that still a possibility to consider  So let us see how the symptoms are doing today and decide if he wants to pursue these above

## 2019-11-15 ENCOUNTER — Encounter: Payer: Self-pay | Admitting: Hematology and Oncology

## 2019-11-15 NOTE — Telephone Encounter (Signed)
Message sent to patient via mychart

## 2019-11-16 ENCOUNTER — Telehealth: Payer: Self-pay | Admitting: Family Medicine

## 2019-11-16 NOTE — Telephone Encounter (Signed)
Called pt he will come in tomorrow.  He has called the antibody clinic tomorrow as Dr Maurilio Lovely requested he left them a message and they will get back in touch with him within 48 hours.

## 2019-11-17 ENCOUNTER — Ambulatory Visit: Payer: Medicare HMO | Admitting: Medical

## 2019-11-17 ENCOUNTER — Encounter: Payer: Self-pay | Admitting: Medical

## 2019-11-17 ENCOUNTER — Ambulatory Visit (INDEPENDENT_AMBULATORY_CARE_PROVIDER_SITE_OTHER): Payer: Medicare HMO | Admitting: Medical

## 2019-11-17 ENCOUNTER — Ambulatory Visit (HOSPITAL_COMMUNITY)
Admission: RE | Admit: 2019-11-17 | Discharge: 2019-11-17 | Disposition: A | Discharge status: Home / Self Care | Payer: Medicare HMO | Source: Ambulatory Visit | Attending: Medical | Admitting: Medical

## 2019-11-17 ENCOUNTER — Ambulatory Visit
Admission: RE | Admit: 2019-11-17 | Discharge: 2019-11-17 | Disposition: A | Discharge status: Home / Self Care | Payer: Medicare HMO | Source: Ambulatory Visit | Attending: Medical | Admitting: Medical

## 2019-11-17 ENCOUNTER — Other Ambulatory Visit: Payer: Self-pay

## 2019-11-17 ENCOUNTER — Telehealth: Payer: Self-pay | Admitting: Medical

## 2019-11-17 VITALS — BP 110/60 | HR 93 | Temp 98.6°F | Ht 67.0 in | Wt 144.0 lb

## 2019-11-17 DIAGNOSIS — R0602 Shortness of breath: Secondary | ICD-10-CM | POA: Diagnosis not present

## 2019-11-17 DIAGNOSIS — U071 COVID-19: Secondary | ICD-10-CM | POA: Insufficient documentation

## 2019-11-17 DIAGNOSIS — C9110 Chronic lymphocytic leukemia of B-cell type not having achieved remission: Secondary | ICD-10-CM

## 2019-11-17 DIAGNOSIS — R059 Cough, unspecified: Secondary | ICD-10-CM | POA: Insufficient documentation

## 2019-11-17 DIAGNOSIS — R5383 Other fatigue: Secondary | ICD-10-CM | POA: Insufficient documentation

## 2019-11-17 NOTE — Patient Instructions (Signed)
Please go to [facility] for your Chest xray.   Their hours are 8am - 4:30 pm Monday - Friday.  Take your insurance card with you.  [facility] [phone]  [address], [address] [place], [place] [address]  [address] [place], [place] [address] 

## 2019-11-17 NOTE — Progress Notes (Signed)
Subjective:  Tyler Wu is a 74 y.o. male who presents for Chief Complaint  Patient presents with  . Follow-up    sinus pressure      Here for follow-up on Covid infection.  I saw him back on October 8 for initial visit followed by October 18 for ongoing sinus pressure.  He has underlying history of chronic lymphocytic leukemia.  He has had ongoing fatigue, cough, some shortness of breath, and just not feeling all that much improved for weeks now since being diagnosed with Covid several weeks ago.  See October 8 visit for other prior details.  When we did the tele visit on October 18 he was not feeling terribly bad but when he my chart messages yesterday he was feeling worse, and fever, was getting low readings on a pulse oximetry at home and even called EMS out.  When EMS came out though there pulse oximetry reading was only down to around 95% compared to his lower reading so that brought into question the validity of his oximetry reading.  Today he feels okay, not terribly bad.  He is still has some cough, some shortness of breath, but feels like he is hydrating okay.  He is still finishing out the amoxicillin.  He quit taking DayQuil NyQuil combination.  Smell and taste are okay.  No diarrhea no nausea or vomiting, no fever, no sore throat, no particular headache.  No body aches or chills.  Mainly fatigue some shortness of breath and cough.  Still using inhaler some. No other aggravating or relieving factors.    No other c/o.  Past Medical History:  Diagnosis Date  . Allergy   . CLL (chronic lymphocytic leukemia) ([place]) 2005   followed by oncology yearly.   Current Outpatient Medications on File Prior to Visit  Medication Sig Dispense Refill  . albuterol (VENTOLIN HFA) 108 (90 Base) MCG/ACT inhaler Inhale 2 puffs into the lungs every 6 (six) hours as needed for wheezing or shortness of breath. 8 g 0  . amoxicillin (AMOXIL) 875 MG tablet Take 1 tablet (875 mg total) by mouth 2 (two)  times daily. 20 tablet 0  . ascorbic acid (VITAMIN C) 500 MG tablet Take 500 mg by mouth daily.    Marland Kitchen aspirin 81 MG chewable tablet Chew 81 mg by mouth.    . cholecalciferol (VITAMIN D3) 25 MCG (1000 UNIT) tablet Take 1,000 Units by mouth daily.    . vitamin B-12 (CYANOCOBALAMIN) 1000 MCG tablet Take 1,000 mcg by mouth daily.    Marland Kitchen zinc gluconate 50 MG tablet Take 50 mg by mouth daily.    Marland Kitchen dexamethasone (DECADRON) 6 MG tablet Take 6 mg by mouth daily. (Patient not taking: Reported on 11/13/2019)    . loratadine (CLARITIN) 10 MG tablet Take 10 mg by mouth. (Patient not taking: Reported on 11/13/2019)    . montelukast (SINGULAIR) 10 MG tablet Take 10 mg by mouth at bedtime. (Patient not taking: Reported on 11/13/2019)    . ondansetron (ZOFRAN-ODT) 8 MG disintegrating tablet Take 8 mg by mouth every 8 (eight) hours as needed for nausea or vomiting. (Patient not taking: Reported on 11/13/2019)     No current facility-administered medications on file prior to visit.    The following portions of the patient's history were reviewed and updated as appropriate: allergies, current medications, past family history, past medical history, past social history, past surgical history and problem list.  ROS Otherwise as in subjective above  Objective: BP 110/60  Pulse 93   Temp 98.6 F (37 C)   Ht 5\' 7"  (1.702 m)   Wt 144 lb (65.3 kg)   SpO2 96%   BMI 22.55 kg/m   Wt Readings from Last 3 Encounters:  11/17/19 144 lb (65.3 kg)  11/13/19 148 lb (67.1 kg)  11/03/19 148 lb 6.4 oz (67.3 kg)    General appearance: alert, no distress, well developed, well nourished, White male HEENT: normocephalic, sclerae anicteric, conjunctiva pink and moist, TMs pearly, nares patent, no discharge or erythema, pharynx normal Oral cavity: MMM, no lesions Neck: supple, no lymphadenopathy, no thyromegaly, no masses, no JVD  heart: RRR, normal S1, S2, no murmurs Lungs: Relatively clear bilaterally, no wheezes,  rhonchi, or rales Pulses: 2+ radial pulses, 2+ pedal pulses, normal cap refill Ext: no edema No cyanosis or clubbing    Assessment: Encounter Diagnoses  Name Primary?  . Cough Yes  . COVID-19 virus infection   . SOB (shortness of breath)   . Fatigue, unspecified type   . CLL (chronic lymphocytic leukemia) (HCC)      Plan: Given his complaints of fever and hypoxemia in the last few days and underlying history of recent Covid infection, I had reached out to [facility] MD consult, and electronic consult which utilizes specialist.  In this case I have consulted with infectious disease.  They had recommended additional testing as below including repeat PCR Covid testing, chest x-ray, and if he test positive again for Covid PCR they recommended infusion therapy.  We discussed importance of hydration, rest, continue albuterol for now.  I noticed today that he was reading his pulse oximetry upside down.  we discussed proper use of his equipment.  Follow-up pending test  Tyler Wu was seen today for follow-up.  Diagnoses and all orders for this visit:  Cough -     DG Chest 2 View; Future -     CBC with Differential/Platelet -     Novel Coronavirus, NAA ([facility]) -     Basic metabolic panel -     Pulse oximetry (single); Future  COVID-19 virus infection -     DG Chest 2 View; Future -     CBC with Differential/Platelet -     Novel Coronavirus, NAA ([facility]) -     Basic metabolic panel -     Pulse oximetry (single); Future  SOB (shortness of breath) -     DG Chest 2 View; Future -     CBC with Differential/Platelet -     Novel Coronavirus, NAA ([facility]) -     Basic metabolic panel  Fatigue, unspecified type -     DG Chest 2 View; Future -     CBC with Differential/Platelet -     Novel Coronavirus, NAA ([facility]) -     Basic metabolic panel  CLL (chronic lymphocytic leukemia) (HCC) -     DG Chest 2 View; Future -     CBC with Differential/Platelet -     Novel Coronavirus, NAA  ([facility]) -     Basic metabolic panel    Follow up: pending labs, CXR

## 2019-11-17 NOTE — Telephone Encounter (Signed)
Patient is going over now.

## 2019-11-17 NOTE — Telephone Encounter (Signed)
Lets try and work him in sooner than the afternoon today

## 2019-11-17 NOTE — Telephone Encounter (Signed)
Please see if he can go right now to [facility] main entrance and report to radiology before 5pm.  they should do it there.  I apologize for the inconvenience.e

## 2019-11-18 LAB — BASIC METABOLIC PANEL
BUN/Creatinine Ratio: 11 (ref 10–24)
BUN: 12 mg/dL (ref 8–27)
CO2: 23 mmol/L (ref 20–29)
Calcium: 8.5 mg/dL — ABNORMAL LOW (ref 8.6–10.2)
Chloride: 99 mmol/L (ref 96–106)
Creatinine, Ser: 1.05 mg/dL (ref 0.76–1.27)
GFR calc Af Amer: 80 mL/min/{1.73_m2} (ref >59)
GFR calc non Af Amer: 70 mL/min/{1.73_m2} (ref >59)
Glucose: 92 mg/dL (ref 65–99)
Potassium: 4.4 mmol/L (ref 3.5–5.2)
Sodium: 135 mmol/L (ref 134–144)

## 2019-11-18 LAB — CBC WITH DIFFERENTIAL/PLATELET
Basophils Absolute: 0.1 10*3/uL (ref 0.0–0.2)
Basos: 0 %
EOS (ABSOLUTE): 0.4 10*3/uL (ref 0.0–0.4)
Eos: 2 %
Hematocrit: 36.7 % — ABNORMAL LOW (ref 37.5–51.0)
Hemoglobin: 12 g/dL — ABNORMAL LOW (ref 13.0–17.7)
Immature Grans (Abs): 0 10*3/uL (ref 0.0–0.1)
Immature Granulocytes: 0 %
Lymphocytes Absolute: 17.4 10*3/uL — ABNORMAL HIGH (ref 0.7–3.1)
Lymphs: 72 %
MCH: 28.3 pg (ref 26.6–33.0)
MCHC: 32.7 g/dL (ref 31.5–35.7)
MCV: 87 fL (ref 79–97)
Monocytes Absolute: 0.5 10*3/uL (ref 0.1–0.9)
Monocytes: 2 %
Neutrophils Absolute: 5.7 10*3/uL (ref 1.4–7.0)
Neutrophils: 24 %
Platelets: 170 10*3/uL (ref 150–450)
RBC: 4.24 x10E6/uL (ref 4.14–5.80)
RDW: 14.2 % (ref 11.6–15.4)
WBC: 24.2 10*3/uL (ref 3.4–10.8)

## 2019-11-18 LAB — SARS-COV-2, NAA 2 DAY TAT

## 2019-11-18 LAB — NOVEL CORONAVIRUS, NAA: SARS-CoV-2, NAA: NOT DETECTED

## 2019-11-20 ENCOUNTER — Other Ambulatory Visit: Payer: Self-pay | Admitting: Medical

## 2019-11-20 MED ORDER — FLUTICASONE FUROATE-VILANTEROL 200-25 MCG/INH IN AEPB: 1.0000 | INHALATION_SPRAY | Freq: Every day | RESPIRATORY_TRACT | 0 refills | Status: DC

## 2019-11-20 MED ORDER — ALBUTEROL SULFATE HFA 108 (90 BASE) MCG/ACT IN AERS: 2.0000 | INHALATION_SPRAY | Freq: Four times a day (QID) | RESPIRATORY_TRACT | 0 refills | Status: DC | PRN

## 2019-11-24 ENCOUNTER — Inpatient Hospital Stay: Payer: Medicare HMO

## 2019-11-24 ENCOUNTER — Encounter: Payer: Self-pay | Admitting: Hematology and Oncology

## 2019-11-24 ENCOUNTER — Inpatient Hospital Stay: Payer: Medicare HMO | Attending: Hematology and Oncology | Admitting: Hematology and Oncology

## 2019-11-24 ENCOUNTER — Other Ambulatory Visit: Payer: Self-pay

## 2019-11-24 DIAGNOSIS — D649 Anemia, unspecified: Secondary | ICD-10-CM | POA: Insufficient documentation

## 2019-11-24 DIAGNOSIS — Z7982 Long term (current) use of aspirin: Secondary | ICD-10-CM | POA: Insufficient documentation

## 2019-11-24 DIAGNOSIS — Z85828 Personal history of other malignant neoplasm of skin: Secondary | ICD-10-CM | POA: Insufficient documentation

## 2019-11-24 DIAGNOSIS — C9110 Chronic lymphocytic leukemia of B-cell type not having achieved remission: Secondary | ICD-10-CM | POA: Diagnosis not present

## 2019-11-24 DIAGNOSIS — Z79899 Other long term (current) drug therapy: Secondary | ICD-10-CM | POA: Insufficient documentation

## 2019-11-24 DIAGNOSIS — Z7951 Long term (current) use of inhaled steroids: Secondary | ICD-10-CM | POA: Diagnosis not present

## 2019-11-24 DIAGNOSIS — Z8616 Personal history of COVID-19: Secondary | ICD-10-CM | POA: Insufficient documentation

## 2019-11-24 NOTE — Progress Notes (Signed)
[facility] OFFICE PROGRESS NOTE  Patient Care Team: Tysinger, Camelia Eng, PA-C as PCP - General (Family Medicine) Heath Lark, MD as Consulting Physician (Hematology and Oncology)  ASSESSMENT & PLAN:  CLL (chronic lymphocytic leukemia) ([place]) Clinically, his blood work appears stable and he has no signs of clinical progression He has palpable left submandibular lymphadenopathy which has been stable I educated the patient signs and symptoms to watch out for disease progression He is up-to-date with influenza vaccination I will continue his yearly followup for CLL with history, physical examination and blood work.  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   All questions were answered. The patient knows to call the clinic with any problems, questions or concerns. The total time spent in the appointment was 20 minutes encounter with patients including review of chart and various tests results, discussions about plan of care and coordination of care plan   Heath Lark, MD 11/24/2019 2:07 PM  INTERVAL HISTORY: Please see below for problem oriented charting. He returns for follow-up on history of CLL He had Covid infection despite being fully vaccinated.  He received antibiotics and steroid treatment but did not receive monoclonal antibody Denies fever or chills His breathing is good No further cough He has persistent loss of taste/smell.  He has lost some weight  SUMMARY OF ONCOLOGIC HISTORY: Oncology  History  CLL (chronic lymphocytic leukemia) ([place])  04/18/2003 Pathology Results   FLOW CYTOMETRIC ANALYSIS OF THE LYMPHOID POPULATION SHOWS A MONOCLONAL, LAMBDA RESTRICTED (DIM) B-CELL POPULATION EXPRESSING PAN B-CELL ANTIGENS INCLUDING CD20 AND CD23. THIS IS ASSOCIATED WITH COEXPRESSION OF CD5 AND CD20. NO CD10 EXPRESSION IS IDENTIFIED. THE FEATURES ARE COMPATIBLE WITH CHRONIC LYMPHOCYTIC  LEUKEMIA.   09/05/2003 Pathology Results   FLOW CYTOMETRY ANALYSIS OF THE CHRONIC LYMPHOCYTIC LEUKEMIA POPULATION SHOWS THAT APPROXIMATELY 18% OF THE CELLS ARE POSITIVE FOR ZAP-70. AS 20% IS THE CUT OFF FOR POSITIVITY, THIS IS INTERPRETED AS A BORDERLINE/NEGATIVE RESULT   11/23/2018 Cancer Staging   Staging form: Chronic Lymphocytic Leukemia / Small Lymphocytic Lymphoma, AJCC 8th Edition - Clinical stage from 11/23/2018: Modified Rai Stage I (Modified Rai risk: Intermediate, Lymphocytosis: Present, Adenopathy: Present, Organomegaly: Absent, Anemia: Absent, Thrombocytopenia: Absent) - Signed by Heath Lark, MD on 11/23/2018     REVIEW OF SYSTEMS:   Constitutional: Denies fevers, chills or abnormal weight loss Eyes: Denies blurriness of vision Ears, nose, mouth, throat, and face: Denies mucositis or sore throat Respiratory: Denies cough, dyspnea or wheezes Cardiovascular: Denies palpitation, chest discomfort or lower extremity swelling Gastrointestinal:  Denies nausea, heartburn or change in bowel habits Skin: Denies abnormal skin rashes Lymphatics: Denies new lymphadenopathy or easy bruising Neurological:Denies numbness, tingling or new weaknesses Behavioral/Psych: Mood is stable, no new changes  All other systems were reviewed with the patient and are negative.  I have reviewed the past medical history, past surgical history, social history and family history with the patient and they are unchanged from previous note.  ALLERGIES:  is allergic to levofloxacin.  MEDICATIONS:  Current Outpatient  Medications  Medication Sig Dispense Refill  . albuterol (VENTOLIN HFA) 108 (90 Base) MCG/ACT inhaler INHALE 2 PUFFS BY MOUTH EVERY 6 HOURS AS NEEDED FOR WHEEZING OR SHORTNESS OF BREATH 54 g 0  . ascorbic acid (VITAMIN C) 500 MG tablet Take 500 mg by mouth daily.    Marland Kitchen aspirin 81 MG chewable tablet Chew 81 mg by mouth.    . cholecalciferol (VITAMIN D3) 25 MCG (1000 UNIT) tablet Take 1,000 Units by mouth daily.    . fluticasone furoate-vilanterol (BREO ELLIPTA) 200-25 MCG/INH AEPB Inhale 1 puff into the lungs daily. 1 each 0  . vitamin B-12 (CYANOCOBALAMIN) 1000 MCG tablet Take 1,000 mcg by mouth daily.    Marland Kitchen zinc gluconate 50 MG tablet Take 50 mg by mouth daily.     No current facility-administered medications for this visit.    PHYSICAL EXAMINATION: ECOG PERFORMANCE STATUS: 1 - Symptomatic but completely ambulatory  Vitals:   11/24/19 1309  BP: 118/65  Pulse: 80  Resp: 18  Temp: (!) 97.5 F (36.4 C)  SpO2: 98%   Filed Weights   11/24/19 1309  Weight: 140 lb (63.5 kg)    GENERAL:alert, no distress and comfortable Musculoskeletal:no cyanosis of digits and no clubbing  NEURO: alert & oriented x 3 with fluent speech, no focal motor/sensory deficits  LABORATORY DATA:  I have reviewed the data as listed    Component Value Date/Time   NA 135 11/17/2019 1236   NA 140 11/22/2014 1320   K 4.4 11/17/2019 1236   K 3.8 11/22/2014 1320   CL 99 11/17/2019 1236   CL 107 11/20/2011 1011   CO2 23 11/17/2019 1236   CO2 24 11/22/2014 1320   GLUCOSE 92 11/17/2019 1236   GLUCOSE 91 11/23/2017 1233   GLUCOSE 154 (H) 11/22/2014 1320   GLUCOSE 87 11/20/2011 1011   BUN 12 11/17/2019 1236   BUN 10.5 11/22/2014 1320   CREATININE 1.05 11/17/2019 1236   CREATININE 1.29 (H) 03/14/2015 0841   CREATININE 1.3 11/22/2014 1320   CALCIUM 8.5 (L) 11/17/2019 1236   CALCIUM 9.1 11/22/2014 1320   PROT 6.9 04/07/2018 1055   PROT 6.4 11/22/2014 1320   ALBUMIN 4.9 (H) 04/07/2018 1055   ALBUMIN 4.1  11/22/2014 1320   AST 19 04/07/2018 1055   AST 13 11/22/2014 1320   ALT 19 04/07/2018 1055   ALT 14 11/22/2014 1320   ALKPHOS 72 04/07/2018 1055   ALKPHOS 67 11/22/2014 1320   BILITOT 0.4 04/07/2018 1055   BILITOT 0.64 11/22/2014 1320   GFRNONAA 70 11/17/2019 1236   GFRAA 80 11/17/2019 1236    No results found for: SPEP, UPEP  Lab Results  Component Value Date   WBC 24.2 (HH) 11/17/2019   NEUTROABS 5.7 11/17/2019   HGB 12.0 (L) 11/17/2019   HCT 36.7 (L) 11/17/2019   MCV 87 11/17/2019   PLT 170 11/17/2019      Chemistry      Component Value Date/Time   NA 135 11/17/2019 1236   NA 140 11/22/2014 1320   K 4.4 11/17/2019 1236   K 3.8 11/22/2014 1320   CL 99 11/17/2019 1236   CL 107 11/20/2011 1011   CO2 23 11/17/2019 1236   CO2 24 11/22/2014 1320   BUN 12 11/17/2019 1236   BUN 10.5 11/22/2014 1320   CREATININE 1.05 11/17/2019 1236   CREATININE 1.29 (H) 03/14/2015 0841   CREATININE 1.3 11/22/2014 1320      Component Value Date/Time   CALCIUM 8.5 (L) 11/17/2019 1236  CALCIUM 9.1 11/22/2014 1320   ALKPHOS 72 04/07/2018 1055   ALKPHOS 67 11/22/2014 1320   AST 19 04/07/2018 1055   AST 13 11/22/2014 1320   ALT 19 04/07/2018 1055   ALT 14 11/22/2014 1320   BILITOT 0.4 04/07/2018 1055   BILITOT 0.64 11/22/2014 1320       RADIOGRAPHIC STUDIES: I have personally reviewed the radiological images as listed and agreed with the findings in the report. DG Chest 2 View  Result Date: 11/20/2019 CLINICAL DATA:  Cough.  Recent COVID.  Fatigue. EXAM: CHEST - 2 VIEW COMPARISON:  Chest radiograph 10/26/2019.  CT 10/31/2019 FINDINGS: Heterogeneous and streaky bilateral lung opacities, slight mid lung zone predominant distribution and greatest in the left lung. Findings have progressed from prior CT. The heart is normal in size. Normal mediastinal contours. No evidence of pneumomediastinum. No pleural fluid or pneumothorax. No acute osseous abnormalities are seen. IMPRESSION:  Heterogeneous and streaky bilateral lung opacities, pattern consistent with COVID-19 pneumonia. There has been progression in parenchymal opacities from prior CT. Electronically Signed   By: Keith Rake M.D.   On: 11/20/2019 01:43

## 2019-11-24 NOTE — Assessment & Plan Note (Signed)
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

## 2019-11-24 NOTE — Assessment & Plan Note (Signed)
He tested positive for Covid infection but did not receive any treatment except for antibiotics and supportive care He is almost fully recovered except for persistent loss of smell He does not need further treatment right now

## 2019-11-24 NOTE — Assessment & Plan Note (Signed)
Clinically, his blood work appears stable and he has no signs of clinical progression He has palpable left submandibular lymphadenopathy which has been stable I educated the patient signs and symptoms to watch out for disease progression He is up-to-date with influenza vaccination I will continue his yearly followup for CLL with history, physical examination and blood work.

## 2019-11-24 NOTE — Assessment & Plan Note (Signed)
He follows closely with dermatologist

## 2020-03-08 ENCOUNTER — Other Ambulatory Visit: Payer: Self-pay

## 2020-03-08 ENCOUNTER — Ambulatory Visit (INDEPENDENT_AMBULATORY_CARE_PROVIDER_SITE_OTHER): Payer: Medicare HMO | Admitting: Medical

## 2020-03-08 ENCOUNTER — Encounter: Payer: Self-pay | Admitting: Medical

## 2020-03-08 VITALS — BP 134/78 | HR 61 | Temp 98.2°F | Ht 67.0 in | Wt 148.4 lb

## 2020-03-08 DIAGNOSIS — N400 Enlarged prostate without lower urinary tract symptoms: Secondary | ICD-10-CM

## 2020-03-08 DIAGNOSIS — C9110 Chronic lymphocytic leukemia of B-cell type not having achieved remission: Secondary | ICD-10-CM | POA: Diagnosis not present

## 2020-03-08 DIAGNOSIS — R599 Enlarged lymph nodes, unspecified: Secondary | ICD-10-CM | POA: Diagnosis not present

## 2020-03-08 DIAGNOSIS — R3915 Urgency of urination: Secondary | ICD-10-CM | POA: Insufficient documentation

## 2020-03-08 DIAGNOSIS — R350 Frequency of micturition: Secondary | ICD-10-CM | POA: Diagnosis not present

## 2020-03-08 DIAGNOSIS — R300 Dysuria: Secondary | ICD-10-CM | POA: Insufficient documentation

## 2020-03-08 LAB — POCT URINALYSIS DIP (PROADVANTAGE DEVICE)
Bilirubin, UA: NEGATIVE
Blood, UA: NEGATIVE
Glucose, UA: NEGATIVE mg/dL
Ketones, POC UA: NEGATIVE mg/dL
Leukocytes, UA: NEGATIVE
Nitrite, UA: NEGATIVE
Protein Ur, POC: NEGATIVE mg/dL
Specific Gravity, Urine: 1.01
Urobilinogen, Ur: 0.2
pH, UA: 7 (ref 5.0–8.0)

## 2020-03-08 NOTE — Progress Notes (Signed)
Subjective:  Tyler Wu is a 75 y.o. male who presents for Chief Complaint  Patient presents with  . Urinary Tract Infection    Frequent urination started 3-4 weeks   . Adenopathy     Dr. Alvy Bimler, oncology Tyler Wu, Tyler Eng, PA-C here for primary care  He notes the last few nights getting up to urinate.  During the day has urinary frequency.   Working at [employer].  Has urine urgency at work.  No blood in urine, no cloudy urine.   No lower abdominal or back pain.  No prior UTI or prostate infection.  Married.  No concern for STD or other infection.  No rash. Sometimes urine seems bubbly.    Has left neck lymph node swollen and staying around.   Not painful, but feels enlarged.  Has had a few sneezing spells, usually has a little congestion most mornings, throat cleaning.   Drinking a lot of water.  No body aches, no chills, no cough, doesn't feel URI sickness.     No other aggravating or relieving factors.    No other c/o.  Past Medical History:  Diagnosis Date  . Allergy   . CLL (chronic lymphocytic leukemia) ([place]) 2005   followed by oncology yearly.   Current Outpatient Medications on File Prior to Visit  Medication Sig Dispense Refill  . ascorbic acid (VITAMIN C) 500 MG tablet Take 500 mg by mouth daily.    Marland Kitchen aspirin 81 MG chewable tablet Chew 81 mg by mouth.    . cholecalciferol (VITAMIN D3) 25 MCG (1000 UNIT) tablet Take 1,000 Units by mouth daily.    . vitamin B-12 (CYANOCOBALAMIN) 1000 MCG tablet Take 1,000 mcg by mouth daily.    Marland Kitchen zinc gluconate 50 MG tablet Take 50 mg by mouth daily.     No current facility-administered medications on file prior to visit.     The following portions of the patient's history were reviewed and updated as appropriate: allergies, current medications, past family history, past medical history, past social history, past surgical history and problem list.  ROS Otherwise as in subjective above  Objective: BP 134/78    Pulse 61   Temp 98.2 F (36.8 C)   Ht 5\' 7"  (1.702 m)   Wt 148 lb 6.4 oz (67.3 kg)   SpO2 98%   BMI 23.24 kg/m    Wt Readings from Last 3 Encounters:  03/08/20 148 lb 6.4 oz (67.3 kg)  11/24/19 140 lb (63.5 kg)  11/17/19 144 lb (65.3 kg)   General appearance: alert, no distress, well developed, well nourished HEENT: normocephalic, sclerae anicteric, conjunctiva pink and moist, TMs pearly, nares patent, no discharge or erythema, pharynx normal Oral cavity: MMM, no lesions Neck: supple, somewhat shoddy submandibular nodes bilat, otherwise lymphadenopathy, no thyromegaly, no masses Heart: RRR, normal S1, S2, no murmurs Lungs: CTA bilaterally, no wheezes, rhonchi, or rales Abdomen: +bs, soft, non tender, non distended, no masses, no hepatomegaly, no splenomegaly Pulses: 2+ radial pulses, 2+ pedal pulses, normal cap refill Ext: no edema DRE: moderately enlarged prostate, not boggy, no nodules    Assessment: Encounter Diagnoses  Name Primary?  . Frequent urination Yes  . Enlarged prostate   . Lymph node enlargement   . Dysuria   . Urinary urgency   . CLL (chronic lymphocytic leukemia) ([place])      Plan: I reviewed the: Fit test which was negative for stool that he brought in from his insurer  We discussed his current  symptoms.  I suspect BPH as a cause given his significantly enlarged prostate.  We will check some other labs today.  If negative for culture and PSA within reason we will likely start Flomax.  We also discussed finasteride and Cialis as potential treatment   Lymph node and lies might be reactive.  Is not unusually large.  Labs today    Tyler Wu was seen today for urinary tract infection and adenopathy.  Diagnoses and all orders for this visit:  Frequent urination -     POCT Urinalysis DIP (Proadvantage Device) -     Urine Culture -     CBC with Differential/Platelet -     PSA  Enlarged prostate -     PSA  Lymph node enlargement -     CBC with  Differential/Platelet -     Comprehensive metabolic panel  Dysuria  Urinary urgency  CLL (chronic lymphocytic leukemia) (HCC) -     CBC with Differential/Platelet    Follow up: pending labs

## 2020-03-09 ENCOUNTER — Other Ambulatory Visit: Payer: Self-pay | Admitting: Medical

## 2020-03-09 LAB — COMPREHENSIVE METABOLIC PANEL
ALT: 17 IU/L (ref 0–44)
AST: 14 IU/L (ref 0–40)
Albumin/Globulin Ratio: 2.6 — ABNORMAL HIGH (ref 1.2–2.2)
Albumin: 4.5 g/dL (ref 3.7–4.7)
Alkaline Phosphatase: 66 IU/L (ref 44–121)
BUN/Creatinine Ratio: 10 (ref 10–24)
BUN: 10 mg/dL (ref 8–27)
Bilirubin Total: 0.3 mg/dL (ref 0.0–1.2)
CO2: 22 mmol/L (ref 20–29)
Calcium: 9.4 mg/dL (ref 8.6–10.2)
Chloride: 105 mmol/L (ref 96–106)
Creatinine, Ser: 0.99 mg/dL (ref 0.76–1.27)
GFR calc Af Amer: 86 mL/min/{1.73_m2} (ref >59)
GFR calc non Af Amer: 75 mL/min/{1.73_m2} (ref >59)
Globulin, Total: 1.7 g/dL (ref 1.5–4.5)
Glucose: 86 mg/dL (ref 65–99)
Potassium: 4.4 mmol/L (ref 3.5–5.2)
Sodium: 143 mmol/L (ref 134–144)
Total Protein: 6.2 g/dL (ref 6.0–8.5)

## 2020-03-09 LAB — CBC WITH DIFFERENTIAL/PLATELET
Basophils Absolute: 0.1 10*3/uL (ref 0.0–0.2)
Basos: 0 %
EOS (ABSOLUTE): 0.2 10*3/uL (ref 0.0–0.4)
Eos: 1 %
Hematocrit: 44.7 % (ref 37.5–51.0)
Hemoglobin: 14.5 g/dL (ref 13.0–17.7)
Immature Grans (Abs): 0 10*3/uL (ref 0.0–0.1)
Immature Granulocytes: 0 %
Lymphocytes Absolute: 16.4 10*3/uL — ABNORMAL HIGH (ref 0.7–3.1)
Lymphs: 71 %
MCH: 28.7 pg (ref 26.6–33.0)
MCHC: 32.4 g/dL (ref 31.5–35.7)
MCV: 89 fL (ref 79–97)
Monocytes Absolute: 1.3 10*3/uL — ABNORMAL HIGH (ref 0.1–0.9)
Monocytes: 6 %
Neutrophils Absolute: 5 10*3/uL (ref 1.4–7.0)
Neutrophils: 22 %
Platelets: 180 10*3/uL (ref 150–450)
RBC: 5.05 x10E6/uL (ref 4.14–5.80)
RDW: 13 % (ref 11.6–15.4)
WBC: 22.9 10*3/uL (ref 3.4–10.8)

## 2020-03-09 LAB — URINE CULTURE: Organism ID, Bacteria: NO GROWTH

## 2020-03-09 LAB — PSA: Prostate Specific Ag, Serum: 1.7 ng/mL (ref 0.0–4.0)

## 2020-03-09 MED ORDER — TAMSULOSIN HCL 0.4 MG PO CAPS: 0.4000 mg | ORAL_CAPSULE | Freq: Every day | ORAL | 2 refills | Status: DC

## 2020-03-12 ENCOUNTER — Other Ambulatory Visit: Payer: Self-pay | Admitting: Internal Medicine

## 2020-04-04 ENCOUNTER — Telehealth: Payer: Self-pay

## 2020-04-04 ENCOUNTER — Encounter: Payer: Self-pay | Admitting: Hematology and Oncology

## 2020-04-04 NOTE — Telephone Encounter (Signed)
Called per [employer] and scheduled appt for 3/21 at 1120, instructed to arrive 20 mins early. He is aware of appt time,

## 2020-04-15 ENCOUNTER — Inpatient Hospital Stay: Payer: Medicare HMO | Attending: Hematology and Oncology | Admitting: Hematology and Oncology

## 2020-04-15 ENCOUNTER — Other Ambulatory Visit: Payer: Self-pay

## 2020-04-15 ENCOUNTER — Encounter: Payer: Self-pay | Admitting: Hematology and Oncology

## 2020-04-15 ENCOUNTER — Telehealth: Payer: Self-pay | Admitting: Hematology and Oncology

## 2020-04-15 DIAGNOSIS — Z85828 Personal history of other malignant neoplasm of skin: Secondary | ICD-10-CM | POA: Insufficient documentation

## 2020-04-15 DIAGNOSIS — Z7982 Long term (current) use of aspirin: Secondary | ICD-10-CM | POA: Diagnosis not present

## 2020-04-15 DIAGNOSIS — Z79899 Other long term (current) drug therapy: Secondary | ICD-10-CM | POA: Insufficient documentation

## 2020-04-15 DIAGNOSIS — C9110 Chronic lymphocytic leukemia of B-cell type not having achieved remission: Secondary | ICD-10-CM

## 2020-04-15 DIAGNOSIS — R030 Elevated blood-pressure reading, without diagnosis of hypertension: Secondary | ICD-10-CM | POA: Diagnosis not present

## 2020-04-15 NOTE — Assessment & Plan Note (Signed)
This could be exacerbated by mild anxiety today Observe closely for now

## 2020-04-15 NOTE — Assessment & Plan Note (Signed)
His palpable submandibular region appears somewhat more prominent compared to previous visit However, on review of his recent blood counts, the lymphocyte count is stable with no signs of anemia or thrombocytopenia Overall, he has no significant B symptoms I recommend observation with repeat physical examination again in 2 months The patient is educated to watch for signs and symptoms of disease progression

## 2020-04-15 NOTE — Progress Notes (Signed)
[facility] OFFICE PROGRESS NOTE  Patient Care Team: Tysinger, Camelia Eng, PA-C as PCP - General (Family Medicine) Heath Lark, MD as Consulting Physician (Hematology and Oncology)  ASSESSMENT & PLAN:  CLL (chronic lymphocytic leukemia) ([place]) His palpable submandibular region appears somewhat more prominent compared to previous visit However, on review of his recent blood counts, the lymphocyte count is stable with no signs of anemia or thrombocytopenia Overall, he has no significant B symptoms I recommend observation with repeat physical examination again in 2 months The patient is educated to watch for signs and symptoms of disease progression  History of skin cancer He follows closely with dermatologist He denies new skin lesions around the head and neck region  Elevated BP without diagnosis of hypertension This could be exacerbated by mild anxiety today Observe closely for now   No orders of the defined types were placed in this encounter.   All questions were answered. The patient knows to call the clinic with any problems, questions or concerns. The total time spent in the appointment was 20 minutes encounter with patients including review of chart and various tests results, discussions about plan of care and coordination of care plan   Heath Lark, MD 04/15/2020 3:01 PM  INTERVAL HISTORY: Please see below for problem oriented charting. He is seen today because of recent findings of slight progression of lymphadenopathy on the left side of the submandibular region He denies new lymphadenopathy elsewhere He has no recent head and neck or dental infection No new skin lesions on the head and neck region The enlarged lymph node is not bothering him or causing any pain or mobility issue of his neck He had recent blood work done in his primary care doctor's office which I have reviewed  SUMMARY OF ONCOLOGIC HISTORY: Oncology History  CLL (chronic lymphocytic  leukemia) ([place])  04/18/2003 Pathology Results   FLOW CYTOMETRIC ANALYSIS OF THE LYMPHOID POPULATION SHOWS A MONOCLONAL, LAMBDA RESTRICTED (DIM) B-CELL POPULATION EXPRESSING PAN B-CELL ANTIGENS INCLUDING CD20 AND CD23. THIS IS ASSOCIATED WITH COEXPRESSION OF CD5 AND CD20. NO CD10 EXPRESSION IS IDENTIFIED. THE FEATURES ARE COMPATIBLE WITH CHRONIC LYMPHOCYTIC  LEUKEMIA.   09/05/2003 Pathology Results   FLOW CYTOMETRY ANALYSIS OF THE CHRONIC LYMPHOCYTIC LEUKEMIA POPULATION SHOWS THAT APPROXIMATELY 18% OF THE CELLS ARE POSITIVE FOR ZAP-70. AS 20% IS THE CUT OFF FOR POSITIVITY, THIS IS INTERPRETED AS A BORDERLINE/NEGATIVE RESULT   11/23/2018 Cancer Staging   Staging form: Chronic Lymphocytic Leukemia / Small Lymphocytic Lymphoma, AJCC 8th Edition - Clinical stage from 11/23/2018: Modified Rai Stage I (Modified Rai risk: Intermediate, Lymphocytosis: Present, Adenopathy: Present, Organomegaly: Absent, Anemia: Absent, Thrombocytopenia: Absent) - Signed by Heath Lark, MD on 11/23/2018     REVIEW OF SYSTEMS:   Constitutional: Denies fevers, chills or abnormal weight loss Eyes: Denies blurriness of vision Ears, nose, mouth, throat, and face: Denies mucositis or sore throat Respiratory: Denies cough, dyspnea or wheezes Cardiovascular: Denies palpitation, chest discomfort or lower extremity swelling Gastrointestinal:  Denies nausea, heartburn or change in bowel habits Skin: Denies abnormal skin rashes Neurological:Denies numbness, tingling or new weaknesses Behavioral/Psych: Mood is stable, no new changes  All other systems were reviewed with the patient and are negative.  I have reviewed the past medical history, past surgical history, social history and family history with the patient and they are unchanged from previous note.  ALLERGIES:  is allergic to levofloxacin.  MEDICATIONS:  Current Outpatient Medications  Medication Sig Dispense Refill  . aspirin 81 MG chewable  tablet Chew 81 mg by mouth.     . tamsulosin (FLOMAX) 0.4 MG CAPS capsule Take 1 capsule (0.4 mg total) by mouth daily after supper. 30 capsule 2   No current facility-administered medications for this visit.    PHYSICAL EXAMINATION: ECOG PERFORMANCE STATUS: 1 - Symptomatic but completely ambulatory  Vitals:   04/15/20 1129 04/15/20 1132  BP: (!) 165/70 (!) 158/70  Pulse: (!) 56   Resp: 16   Temp: (!) 96.5 F (35.8 C)   SpO2: 100%    Filed Weights   04/15/20 1129  Weight: 150 lb 12.8 oz (68.4 kg)    GENERAL:alert, no distress and comfortable SKIN: skin color, texture, turgor are normal, no rashes or significant lesions EYES: normal, Conjunctiva are pink and non-injected, sclera clear OROPHARYNX:no exudate, no erythema and lips, buccal mucosa, and tongue normal  NECK: supple, thyroid normal size, non-tender, without nodularity LYMPH: Previously palpable lymphadenopathy on the left submandibular region appears more prominent.  It represent a cluster of 2-3 different smaller lymph nodes.  No other lymphadenopathy elsewhere LUNGS: clear to auscultation and percussion with normal breathing effort HEART: regular rate & rhythm and no murmurs and no lower extremity edema ABDOMEN:abdomen soft, non-tender and normal bowel sounds Musculoskeletal:no cyanosis of digits and no clubbing  NEURO: alert & oriented x 3 with fluent speech, no focal motor/sensory deficits  LABORATORY DATA:  I have reviewed the data as listed    Component Value Date/Time   NA 143 03/08/2020 1042   NA 140 11/22/2014 1320   K 4.4 03/08/2020 1042   K 3.8 11/22/2014 1320   CL 105 03/08/2020 1042   CL 107 11/20/2011 1011   CO2 22 03/08/2020 1042   CO2 24 11/22/2014 1320   GLUCOSE 86 03/08/2020 1042   GLUCOSE 91 11/23/2017 1233   GLUCOSE 154 (H) 11/22/2014 1320   GLUCOSE 87 11/20/2011 1011   BUN 10 03/08/2020 1042   BUN 10.5 11/22/2014 1320   CREATININE 0.99 03/08/2020 1042   CREATININE 1.29 (H) 03/14/2015 0841   CREATININE 1.3  11/22/2014 1320   CALCIUM 9.4 03/08/2020 1042   CALCIUM 9.1 11/22/2014 1320   PROT 6.2 03/08/2020 1042   PROT 6.4 11/22/2014 1320   ALBUMIN 4.5 03/08/2020 1042   ALBUMIN 4.1 11/22/2014 1320   AST 14 03/08/2020 1042   AST 13 11/22/2014 1320   ALT 17 03/08/2020 1042   ALT 14 11/22/2014 1320   ALKPHOS 66 03/08/2020 1042   ALKPHOS 67 11/22/2014 1320   BILITOT 0.3 03/08/2020 1042   BILITOT 0.64 11/22/2014 1320   GFRNONAA 75 03/08/2020 1042   GFRAA 86 03/08/2020 1042    No results found for: SPEP, UPEP  Lab Results  Component Value Date   WBC 22.9 (HH) 03/08/2020   NEUTROABS 5.0 03/08/2020   HGB 14.5 03/08/2020   HCT 44.7 03/08/2020   MCV 89 03/08/2020   PLT 180 03/08/2020      Chemistry      Component Value Date/Time   NA 143 03/08/2020 1042   NA 140 11/22/2014 1320   K 4.4 03/08/2020 1042   K 3.8 11/22/2014 1320   CL 105 03/08/2020 1042   CL 107 11/20/2011 1011   CO2 22 03/08/2020 1042   CO2 24 11/22/2014 1320   BUN 10 03/08/2020 1042   BUN 10.5 11/22/2014 1320   CREATININE 0.99 03/08/2020 1042   CREATININE 1.29 (H) 03/14/2015 0841   CREATININE 1.3 11/22/2014 1320      Component Value Date/Time  CALCIUM 9.4 03/08/2020 1042   CALCIUM 9.1 11/22/2014 1320   ALKPHOS 66 03/08/2020 1042   ALKPHOS 67 11/22/2014 1320   AST 14 03/08/2020 1042   AST 13 11/22/2014 1320   ALT 17 03/08/2020 1042   ALT 14 11/22/2014 1320   BILITOT 0.3 03/08/2020 1042   BILITOT 0.64 11/22/2014 1320

## 2020-04-15 NOTE — Assessment & Plan Note (Signed)
He follows closely with dermatologist He denies new skin lesions around the head and neck region

## 2020-04-15 NOTE — Telephone Encounter (Signed)
Scheduled appt per 3/21 sch msg. Pt aware.

## 2020-04-17 DIAGNOSIS — D1801 Hemangioma of skin and subcutaneous tissue: Secondary | ICD-10-CM | POA: Diagnosis not present

## 2020-04-17 DIAGNOSIS — L821 Other seborrheic keratosis: Secondary | ICD-10-CM | POA: Diagnosis not present

## 2020-04-17 DIAGNOSIS — L720 Epidermal cyst: Secondary | ICD-10-CM | POA: Diagnosis not present

## 2020-05-02 ENCOUNTER — Ambulatory Visit (INDEPENDENT_AMBULATORY_CARE_PROVIDER_SITE_OTHER): Payer: Medicare HMO | Admitting: Medical

## 2020-05-02 ENCOUNTER — Encounter: Payer: Self-pay | Admitting: Medical

## 2020-05-02 ENCOUNTER — Other Ambulatory Visit: Payer: Self-pay

## 2020-05-02 VITALS — BP 126/70 | HR 68 | Ht 67.0 in | Wt 148.2 lb

## 2020-05-02 DIAGNOSIS — Z1211 Encounter for screening for malignant neoplasm of colon: Secondary | ICD-10-CM | POA: Insufficient documentation

## 2020-05-02 DIAGNOSIS — Z8616 Personal history of COVID-19: Secondary | ICD-10-CM

## 2020-05-02 DIAGNOSIS — E569 Vitamin deficiency, unspecified: Secondary | ICD-10-CM | POA: Insufficient documentation

## 2020-05-02 DIAGNOSIS — Z13220 Encounter for screening for lipoid disorders: Secondary | ICD-10-CM | POA: Diagnosis not present

## 2020-05-02 DIAGNOSIS — N400 Enlarged prostate without lower urinary tract symptoms: Secondary | ICD-10-CM

## 2020-05-02 DIAGNOSIS — C9110 Chronic lymphocytic leukemia of B-cell type not having achieved remission: Secondary | ICD-10-CM

## 2020-05-02 DIAGNOSIS — E882 Lipomatosis, not elsewhere classified: Secondary | ICD-10-CM

## 2020-05-02 DIAGNOSIS — Z7185 Encounter for immunization safety counseling: Secondary | ICD-10-CM | POA: Diagnosis not present

## 2020-05-02 DIAGNOSIS — Z0000 Encounter for general adult medical examination without abnormal findings: Secondary | ICD-10-CM

## 2020-05-02 DIAGNOSIS — I700 Atherosclerosis of aorta: Secondary | ICD-10-CM

## 2020-05-02 DIAGNOSIS — R3915 Urgency of urination: Secondary | ICD-10-CM

## 2020-05-02 DIAGNOSIS — Z13228 Encounter for screening for other metabolic disorders: Secondary | ICD-10-CM | POA: Diagnosis not present

## 2020-05-02 DIAGNOSIS — Z85828 Personal history of other malignant neoplasm of skin: Secondary | ICD-10-CM | POA: Diagnosis not present

## 2020-05-02 DIAGNOSIS — Z7189 Other specified counseling: Secondary | ICD-10-CM

## 2020-05-02 NOTE — Patient Instructions (Addendum)
You are not up-to-date on tetanus vaccine.  I recommend checking with health department or pharmacy to compare cost.  many Medicare plans do not cover the tetanus vaccine.  Shingles vaccine:  I recommend you have a shingles vaccine to help prevent shingles or herpes zoster outbreak.   Please call your insurer to inquire about coverage for the Shingrix vaccine given in 2 doses.   Some insurers cover this vaccine after age 75, some cover this after age 66.  If your insurer covers this, then call  Pneumonia vaccine:  I recommend you have a Pneumonia/Pneumococcal 23 vaccine to help prevent 23 of the more than 100 strains of bacterial pneumonia.   Please call your insurer to inquire about coverage for the Pneumonia vaccine.   If your insurer covers this, then call to schedule appointment to have this vaccine here.    Screening for cancer: Colon cancer screening: We will refer you for screening colonoscopy  Please call your insurance company to check coverage for colon cancer screening.  Options may include Cologard stool test or Colonoscopy.  You should also inquire about which facility the colonoscopy could be performed, and coverage for diagnostic vs screening colonoscopy as coverage may vary.  If you have significant family history of colon cancer or blood in the stool, then you should only do the colonoscopy, not the cologard test.  We discussed PSA, prostate exam, and prostate cancer screening risks/benefits.    Skin cancer screening: Check your skin regularly for new changes, growing lesions, or other lesions of concern Come in for evaluation if you have skin lesions of concern.  Lung cancer screening: If you have a greater than 20 pack year history of tobacco use, then you may qualify for lung cancer screening with a chest CT scan.   Please call your insurance company to inquire about coverage for this test.  We currently don't have screenings for other cancers besides breast, cervical,  colon, and lung cancers.  If you have a strong family history of cancer or have other cancer screening concerns, please let me know.    Bone health: Get at least 150 minutes of aerobic exercise weekly Get weight bearing exercise at least once weekly Bone density test:   A bone density test is an imaging test that uses a type of X-ray to measure the amount of calcium and other minerals in your bones.  The test may be used to diagnose or screen you for a condition that causes weak or thin bones (osteoporosis), predict your risk for a broken bone (fracture), or determine how well your osteoporosis treatment is working. The bone density test is recommended for females 2 and older, or females or males <93 if certain risk factors such as thyroid disease, long term use of steroids such as for asthma or rheumatological issues, vitamin D deficiency, estrogen deficiency, family history of osteoporosis, self or family history of fragility fracture in first degree relative.    Heart health: Get at least 150 minutes of aerobic exercise weekly Limit alcohol It is important to maintain a healthy blood pressure and healthy cholesterol numbers  Heart disease screening: Screening for heart disease includes screening for blood pressure, fasting lipids, glucose/diabetes screening, BMI height to weight ratio, reviewed of smoking status, physical activity, and diet.    Goals include blood pressure 120/80 or less, maintaining a healthy lipid/cholesterol profile, preventing diabetes or keeping diabetes numbers under good control, not smoking or using tobacco products, exercising most days per week or at  least 150 minutes per week of exercise, and eating healthy variety of fruits and vegetables, healthy oils, and avoiding unhealthy food choices like fried food, fast food, high sugar and high cholesterol foods.    Other tests may possibly include EKG test, CT coronary calcium score, echocardiogram, exercise  treadmill stress test.    Medical care options: I recommend you continue to seek care here first for routine care.  We try really hard to have available appointments Monday through Friday daytime hours for sick visits, acute visits, and physicals.  Urgent care should be used for after hours and weekends for significant issues that cannot wait till the next day.  The emergency department should be used for significant potentially life-threatening emergencies.  The emergency department is expensive, can often have long wait times for less significant concerns, so try to utilize primary care, urgent care, or telemedicine when possible to avoid unnecessary trips to the emergency department.  Virtual visits and telemedicine have been introduced since the pandemic started in 2020, and can be convenient ways to receive medical care.  We offer virtual appointments as well to assist you in a variety of options to seek medical care.    Separate significant issues discussed: Advanced Directives - get [place] a copy of your living will and health care power of attorney  Aortic atherosclerosis - I recommend a statin medication and low cholesterol diet . You decline cholesterol medicaiton today. 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 Let me know in a month or 2 if you want to add Finasteride for addition help with symptoms  CLL - follow up with Dr. Alvy Bimler as planned      Atherosclerosis  Atherosclerosis is narrowing and hardening of the arteries. Arteries are blood vessels that carry blood from the heart to all parts of the body. This blood contains oxygen. Arteries can become narrow or blocked from inflammation or from a buildup of fat, cholesterol, calcium, and other substances (plaque). Plaque decreases the amount of blood that can flow through the artery. Atherosclerosis can affect any artery in your body, including:  Heart  arteries. Damage to these arteries may lead to coronary artery disease, which can cause a heart attack.  Brain arteries. Damage to these arteries may cause a stroke.  Leg, arm, and pelvis arteries. Peripheral artery disease (PAD) may result from damage to these arteries.  Kidney arteries. Kidney (renal) failure may result from damage to kidney arteries. Treatment may slow the disease and prevent further damage to your heart, brain, peripheral arteries, and kidneys. What are the causes? This condition develops slowly over many years. The inner layers of your arteries become damaged and allow the gradual buildup of plaque. The exact cause of atherosclerosis is not fully understood. Symptoms of atherosclerosis do not occur until an artery becomes narrow or blocked. What increases the risk? The following factors may make you more likely to develop this condition:  Being middle-aged or older.  Certain medical conditions, including: ? High blood pressure. ? High cholesterol. ? High blood fats (triglycerides). ? Diabetes. ? Sleep apnea.  A family history of atherosclerosis.  Being overweight.  Using products that contain tobacco or nicotine.  Not exercising enough (sedentary lifestyle).  Having a substance in your blood called C-reactive protein (CRP). This is a sign of increased levels of inflammation in your body.  Being stressed.  Drinking too much alcohol or using drugs, such as cocaine or methamphetamine.  What are the signs or symptoms? Symptoms of atherosclerosis may not be obvious until there is damage to an area of your body that is not getting enough blood. Sometimes, atherosclerosis does not cause symptoms. Symptoms of this condition include:  Coronary artery disease. This may cause chest pain and shortness of breath.  Decreased blood supply to your brain, which may cause a stroke. Signs of a stroke may include sudden: ? Weakness or numbness in your face, arm, or leg,  especially on one side of your body. ? Trouble walking or difficulty moving your arms or legs. ? Loss of balance or coordination. ? Confusion. ? Slurred speech (dysarthria). ? Trouble speaking, or trouble understanding speech, or both (aphasia). ? Vision changes in one or both eyes. This may be double vision, blurred vision, or loss of vision. ? Severe headache with no known cause. The headache is often described as the worst headache ever experienced.  PAD, which may cause pain and numbness, often in your legs and hips.  Renal failure. This may cause tiredness, problems with urination, swelling, and itchy skin. How is this diagnosed? This condition is diagnosed based on your medical history and a physical exam. During the exam, your health care provider will:  Check your pulse in different places.  Listen for a "whooshing" sound over your arteries (bruit). You may also have tests, such as:  Blood tests to check your levels of cholesterol, triglycerides, and CRP.  Electrocardiogram (ECG) to check for heart damage.  Chest X-ray to see if you have an enlarged heart, which is a sign of heart failure.  Stress test to see how your heart reacts to exercise.  Echocardiogram to get images of the inside of your heart.  Ankle-brachial index to compare blood pressure in your arms to blood pressure in your ankles.  Ultrasound of your peripheral arteries to check blood flow.  CT scan to check for damage to your heart or brain.  X-rays of blood vessels after dye has been injected (angiogram) to check blood flow. How is this treated? This condition is treated with lifestyle changes as the first step. These may include:  Changing your diet.  Losing weight.  Reducing stress.  Exercising and being physically active more regularly.  Quitting smoking. You may also need medicine to:  Lower triglycerides and cholesterol.  Control blood pressure.  Prevent blood clots.  Lower  inflammation in your body.  Control your blood sugar. Sometimes, surgery is needed to:  Remove plaque from an artery (endarterectomy).  Open or widen a narrowed heart artery (angioplasty).  Create a new path for your blood with one of these procedures: ? Heart (coronary) artery bypass graft surgery. ? Peripheral artery bypass graft surgery. Follow these instructions at home: Eating and drinking  Eat a heart-healthy diet. Talk with your health care provider or a diet and nutrition specialist (dietitian) if you need help. A heart-healthy diet involves: ? Limiting unhealthy fats and increasing healthy fats. Some examples of healthy fats are olive oil and canola oil. ? Eating plant-based foods, such as fruits, vegetables, nuts, whole grains, and legumes (such as peas and lentils).  If you drink alcohol: ? Limit how much you use to:  0-1 drink a day for nonpregnant women.  0-2 drinks a day for men. ? Be aware of how much alcohol is in your drink. In the [place] one drink equals one 12 oz bottle of beer (355 mL), one 5 oz glass of wine (148 mL), or one 1  oz glass of hard liquor (44 mL).   Lifestyle  Maintain a healthy weight. Lose weight if your health care provider says that you need to do that.  Follow an exercise program as told by your health care provider.  Do not use any products that contain nicotine or tobacco, such as cigarettes, e-cigarettes, and chewing tobacco. If you need help quitting, ask your health care provider.  Do not use drugs.   General instructions  Take over-the-counter and prescription medicines only as told by your health care provider.  Manage other health conditions as told.  Keep all follow-up visits as told by your health care provider. This is important. Contact a health care provider if you have:  An irregular heartbeat.  Unexplained fatigue.  Trouble urinating, or you are producing less urine or foamy urine.  Swelling of your hands or feet,  or itchy skin.  Unexplained pain or numbness in your legs or hips. Get help right away if:  You have any symptoms of a heart attack. These may be: ? Chest pain. This includes squeezing chest pain that may feel like indigestion (angina). ? Shortness of breath. ? Pain in your neck, jaw, arms, back, or stomach. ? Cold sweat. ? Nausea. ? Light-headedness.  You have any symptoms of a stroke. "BE FAST" is an easy way to remember the main warning signs of a stroke: ? B - Balance. Signs are dizziness, sudden trouble walking, or loss of balance. ? E - Eyes. Signs are trouble seeing or a sudden change in vision. ? F - Face. Signs are sudden weakness or numbness of the face, or the face or eyelid drooping on one side. ? A - Arms. Signs are weakness or numbness in an arm. This happens suddenly and usually on one side of the body. ? S - Speech. Signs are sudden trouble speaking, slurred speech, or trouble understanding what people say. ? T - Time. Time to call emergency services. Write down what time symptoms started.  You have other signs of a stroke, such as: ? A sudden, severe headache with no known cause. ? Nausea or vomiting. ? Seizure. These symptoms may represent a serious problem that is an emergency. Do not wait to see if the symptoms will go away. Get medical help right away. Call your local emergency services (911 in the [place] Do not drive yourself to the hospital. Summary  Atherosclerosis is narrowing and hardening of the arteries.  Arteries can become narrow from inflammation or from a buildup of fat, cholesterol, calcium, and other substances (plaque).  This condition may not cause any symptoms. If you do have symptoms, they are caused by damage to an area of your body that is not getting enough blood.  Treatment starts with lifestyle changes and may include medicines. In some cases, surgery is needed.  Get help right away if you have any symptoms of a heart attack or  stroke. This information is not intended to replace advice given to you by your health care provider. Make sure you discuss any questions you have with your health care provider. Document Revised: 11/21/2018 Document Reviewed: 11/21/2018 Elsevier Patient Education  [employer].

## 2020-05-02 NOTE — Progress Notes (Signed)
Subjective:    Tyler Wu is a 75 y.o. male who presents for Preventative Services visit and chronic medical problems/med check visit.    Primary Care Provider Nyeisha Goodall, Camelia Eng, PA-C here for primary care  Current Health Care Team:  Dentist, [facility] and [facility] doctor, Dr. Clydene Laming  Dr. Heath Lark, oncology/hematology  Skin surgery center   Medical Services you may have received from other than [facility] providers in the past year (date may be approximate) Skin surgery center [facility] urgent care for covid this past year  Exercise Current exercise habits: The patient has a physically strenuous job, but has no regular exercise apart from work.  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  Nutrition/Diet Current diet: in general, a "healthy" diet    Depression Screen Depression screen PHQ 2/9 05/02/2020  Decreased Interest 0  Down, Depressed, Hopeless 0  PHQ - 2 Score 0    Activities of Daily Living Screen/Functional Status Survey Is the patient deaf or have difficulty hearing?: No Does the patient have difficulty seeing, even when wearing glasses/contacts?: No Does the patient have difficulty concentrating, remembering, or making decisions?: No Does the patient have difficulty walking or climbing stairs?: No Does the patient have difficulty dressing or bathing?: No Does the patient have difficulty doing errands alone such as visiting a doctor's office or shopping?: No  Can patient draw a clock face showing 3:15 oclock, yes  Fall Risk Screen Fall Risk  05/02/2020 08/02/2018 07/06/2018 04/14/2018 04/07/2018  Falls in the past year? 1 0 0 0 0  Number falls in past yr: 0 0 0 0 0  Injury with Fall? 1 0 0 0 0  Risk for fall due to : No Fall Risks - - - -  Follow up Falls evaluation completed Falls evaluation completed Falls evaluation completed;Education provided;Falls prevention discussed Falls evaluation completed Falls evaluation completed    Gait Assessment: Normal gait  observed Yes  Advanced directives Does patient have a [place]? Yes Does patient have a Living Will? Yes  Past Medical History:  Diagnosis Date  . Allergy   . Aortic atherosclerosis ([place]) 10/2019   per CT chest  . BPH (benign prostatic hyperplasia)   . 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  Marland Kitchen History of COVID-19 2021    Past Surgical History:  Procedure Laterality Date  . COLONOSCOPY     around 2012  . HERNIA REPAIR  1996    Social History   Socioeconomic History  . Marital status: Married    Spouse name: Not on file  . Number of children: 2  . Years of education: Not on file  . Highest education level: Associate degree: academic program  Occupational History  . Occupation: Geologist, engineering  Tobacco Use  . Smoking status: Former Smoker    Types: Cigarettes    Quit date: 01/26/1974    Years since quitting: 46.2  . Smokeless tobacco: Never Used  Vaping Use  . Vaping Use: Never used  Substance and Sexual Activity  . Alcohol use: No  . Drug use: No  . Sexual activity: Yes    Birth control/protection: None    Comment: number of sex partners in the last 57 months  1  Other Topics Concern  . Not on file  Social History Narrative   Lives with wife.   Active on job at [employer] center.  Hobbies - guitar, golf, spending time with grandkids, movies,  reading.   Goes to church sometimes, Probation officer.  04/2020   Social Determinants of Health   Financial Resource Strain: Not on file  Food Insecurity: Not on file  Transportation Needs: Not on file  Physical Activity: Not on file  Stress: Not on file  Social Connections: Not on file  Intimate Partner Violence: Not on file    Family History  Problem Relation Age of Onset  . Cancer Father 47       melonoma  . Heart attack Maternal Grandmother   . Hypertension Maternal Grandmother   . Heart attack Maternal Grandfather   . Hypertension Maternal Grandfather   .  Kidney disease Brother      Current Outpatient Medications:  .  aspirin 81 MG chewable tablet, Chew 81 mg by mouth., Disp: , Rfl:  .  loratadine (CLARITIN) 10 MG tablet, , Disp: , Rfl:  .  tamsulosin (FLOMAX) 0.4 MG CAPS capsule, Take 1 capsule (0.4 mg total) by mouth daily after supper., Disp: 30 capsule, Rfl: 2  Allergies  Allergen Reactions  . Levofloxacin Other (See Comments)    Bad dreams     History reviewed: allergies, current medications, past family history, past medical history, past social history, past surgical history and problem list  Chronic issues discussed: BPH - still having nocturia, urgency   Acute issues discussed: 1 fall in past year playing out in the yard with grandkids    Objective:      Biometrics BP 126/70   Pulse 68   Ht 5\' 7"  (1.702 m)   Wt 148 lb 3.2 oz (67.2 kg)   SpO2 98%   BMI 23.21 kg/m    Wt Readings from Last 3 Encounters:  05/02/20 148 lb 3.2 oz (67.2 kg)  04/15/20 150 lb 12.8 oz (68.4 kg)  03/08/20 148 lb 6.4 oz (67.3 kg)     Cognitive Testing  Alert? Yes  Normal Appearance?Yes  Oriented to person? Yes  Place? Yes   Time? Yes  Recall of three objects?  Yes  Can perform simple calculations? Yes  Displays appropriate judgment?Yes  Can read the correct time from a watch face?Yes  General appearance: alert, no distress, WD/WN, white male  Nutritional Status: Inadequate calore intake? no Loss of muscle mass? no Loss of fat beneath skin? no Localized or general edema? no Diminished functional status? no  Other pertinent exam: Skin: Lipomatous soft tissue masses noted of the left medial elbow, left posterior upper arm, larger one on the left lateral upper thigh, smaller 2 adjacent lesions right mid thigh, no worrisome lesions otherwise HEENT: normocephalic, sclerae anicteric, TMs pearly, nares patent, no discharge or erythema, pharynx normal Oral cavity: MMM, no lesions, teeth in good repair Neck: supple, somewhat  larger than expected submandibular nodes bilaterally unchanged per patient, otherwise, no thyromegaly, no masses, on bruits Heart: RRR, normal S1, S2, no murmurs Lungs: CTA bilaterally, no wheezes, rhonchi, or rales Abdomen: +bs, soft, non tender, non distended, no masses, no hepatomegaly, no splenomegaly Musculoskeletal: nontender, no swelling, no obvious deformity Extremities: no edema, no cyanosis, no clubbing Pulses: 2+ symmetric, upper and lower extremities, normal cap refill Neurological: alert, oriented x 3, CN2-12 intact, strength normal upper extremities and lower extremities, sensation normal throughout, DTRs 2+ throughout, no cerebellar signs, gait normal Psychiatric: normal affect, behavior normal, pleasant  GU: Normal male, circumcised, no lesions DRE: Deferred, done last visit  Assessment:   Encounter Diagnoses  Name Primary?  . Encounter for health maintenance examination in adult Yes  .  Medicare annual wellness visit, subsequent   . CLL (chronic lymphocytic leukemia) ([place])   . Enlarged prostate   . History of COVID-19   . History of skin cancer   . Lipomatosis   . Screening for lipid disorders   . Vaccine counseling   . Vitamin deficiency   . Screen for colon cancer   . Advanced directives, counseling/discussion   . Aortic atherosclerosis ([place])   . Urinary urgency      Plan:   A preventative services visit was completed today.  During the course of the visit today, we discussed and counseled about appropriate screening and preventive services.  A health risk assessment was established today that included a review of current medications, allergies, social history, family history, medical and preventative health history, biometrics, and preventative screenings to identify potential safety concerns or impairments.  A personalized plan was printed today for your records and use.   Personalized health advice and education was given today to reduce health risks and  promote self management and wellness.  Information regarding end of life planning was discussed today.    This visit was a preventative care visit, also known as wellness visit or routine physical.   Topics typically include healthy lifestyle, diet, exercise, preventative care, vaccinations, sick and well care, proper use of emergency dept and after hours care, as well as other concerns.     Recommendations: Continue to return yearly for your annual wellness and preventative care visits.  This gives [place] a chance to discuss healthy lifestyle, exercise, vaccinations, review your chart record, and perform screenings where appropriate.  I recommend you see your eye doctor yearly for routine vision care.  I recommend you see your dentist yearly for routine dental care including hygiene visits twice yearly.   Vaccination recommendations were reviewed Immunization History  Administered Date(s) Administered  . Influenza Split 11/11/2013, 10/27/2014, 10/31/2015, 10/26/2016, 11/10/2017  . Influenza, High Dose Seasonal PF 10/23/2018  . Influenza-Unspecified 11/11/2013, 10/31/2015, 10/26/2016  . Moderna Sars-Covid-2 Vaccination 03/13/2019, 04/11/2019  . Pneumococcal Conjugate-13 03/30/2017    You are not up-to-date on tetanus vaccine.  I recommend checking with health department or pharmacy to compare cost.  many Medicare plans do not cover the tetanus vaccine.  Shingles vaccine:  I recommend you have a shingles vaccine to help prevent shingles or herpes zoster outbreak.   Please call your insurer to inquire about coverage for the Shingrix vaccine given in 2 doses.   Some insurers cover this vaccine after age 55, some cover this after age 37.  If your insurer covers this, then call  Pneumonia vaccine:  I recommend you have a Pneumonia/Pneumococcal 23 vaccine to help prevent 23 of the more than 100 strains of bacterial pneumonia.   Please call your insurer to inquire about coverage for the Pneumonia  vaccine.   If your insurer covers this, then call to schedule appointment to have this vaccine here.    Screening for cancer: Colon cancer screening: We will refer you for screening colonoscopy  Please call your insurance company to check coverage for colon cancer screening.  Options may include Cologard stool test or Colonoscopy.  You should also inquire about which facility the colonoscopy could be performed, and coverage for diagnostic vs screening colonoscopy as coverage may vary.  If you have significant family history of colon cancer or blood in the stool, then you should only do the colonoscopy, not the cologard test.  We discussed PSA, prostate exam, and prostate cancer screening risks/benefits.  Skin cancer screening: Check your skin regularly for new changes, growing lesions, or other lesions of concern Come in for evaluation if you have skin lesions of concern.  Lung cancer screening: If you have a greater than 20 pack year history of tobacco use, then you may qualify for lung cancer screening with a chest CT scan.   Please call your insurance company to inquire about coverage for this test.  We currently don't have screenings for other cancers besides breast, cervical, colon, and lung cancers.  If you have a strong family history of cancer or have other cancer screening concerns, please let me know.    Bone health: Get at least 150 minutes of aerobic exercise weekly Get weight bearing exercise at least once weekly Bone density test:   A bone density test is an imaging test that uses a type of X-ray to measure the amount of calcium and other minerals in your bones.  The test may be used to diagnose or screen you for a condition that causes weak or thin bones (osteoporosis), predict your risk for a broken bone (fracture), or determine how well your osteoporosis treatment is working. The bone density test is recommended for females 77 and older, or females or males <32 if  certain risk factors such as thyroid disease, long term use of steroids such as for asthma or rheumatological issues, vitamin D deficiency, estrogen deficiency, family history of osteoporosis, self or family history of fragility fracture in first degree relative.    Heart health: Get at least 150 minutes of aerobic exercise weekly Limit alcohol It is important to maintain a healthy blood pressure and healthy cholesterol numbers  Heart disease screening: Screening for heart disease includes screening for blood pressure, fasting lipids, glucose/diabetes screening, BMI height to weight ratio, reviewed of smoking status, physical activity, and diet.    Goals include blood pressure 120/80 or less, maintaining a healthy lipid/cholesterol profile, preventing diabetes or keeping diabetes numbers under good control, not smoking or using tobacco products, exercising most days per week or at least 150 minutes per week of exercise, and eating healthy variety of fruits and vegetables, healthy oils, and avoiding unhealthy food choices like fried food, fast food, high sugar and high cholesterol foods.    Other tests may possibly include EKG test, CT coronary calcium score, echocardiogram, exercise treadmill stress test.    Medical care options: I recommend you continue to seek care here first for routine care.  We try really hard to have available appointments Monday through Friday daytime hours for sick visits, acute visits, and physicals.  Urgent care should be used for after hours and weekends for significant issues that cannot wait till the next day.  The emergency department should be used for significant potentially life-threatening emergencies.  The emergency department is expensive, can often have long wait times for less significant concerns, so try to utilize primary care, urgent care, or telemedicine when possible to avoid unnecessary trips to the emergency department.  Virtual visits and telemedicine  have been introduced since the pandemic started in 2020, and can be convenient ways to receive medical care.  We offer virtual appointments as well to assist you in a variety of options to seek medical care.    Separate significant issues discussed: Advanced Directives - get [place] a copy of your living will and health care power of attorney  Aortic atherosclerosis - I recommend a statin medication and low cholesterol diet . You decline cholesterol medicaiton today. Work on  healthy low cholesterol diet  BPH /prostate enlargement with lower urinary tract symptoms - continue Flomax. Let me know in a month or 2 if you want to add Finasteride for addition help with symptoms  CLL - follow up with Dr. Alvy Bimler as planned    [place] was seen today for medicare wellness.  Diagnoses and all orders for this visit:  Encounter for health maintenance examination in adult -     Lipid panel -     VITAMIN D 25 Hydroxy (Vit-D Deficiency, Fractures) -     Ambulatory referral to Gastroenterology -     Cancel: POCT Urinalysis DIP (Proadvantage Device)  Medicare annual wellness visit, subsequent  CLL (chronic lymphocytic leukemia) ([place])  Enlarged prostate -     Cancel: POCT Urinalysis DIP (Proadvantage Device)  History of COVID-19  History of skin cancer  Lipomatosis  Screening for lipid disorders -     Lipid panel  Vaccine counseling  Vitamin deficiency  Screen for colon cancer -     Ambulatory referral to Gastroenterology  Advanced directives, counseling/discussion  Aortic atherosclerosis ([place])  Urinary urgency -     Cancel: POCT Urinalysis DIP (Proadvantage Device)   Patient inadvertantly left without giving urine specimen   Follow-up pending labs, yearly for physical   Medicare Attestation A preventative services visit was completed today.  During the course of the visit the patient was educated and counseled about appropriate screening and preventive services.  A health risk  assessment was established with the patient that included a review of current medications, allergies, social history, family history, medical and preventative health history, biometrics, and preventative screenings to identify potential safety concerns or impairments.  A personalized plan was printed today for the patient's records and use.   Personalized health advice and education was given today to reduce health risks and promote self management and wellness.  Information regarding end of life planning was discussed today.  Dorothea Ogle, PA-C   05/02/2020

## 2020-05-03 ENCOUNTER — Telehealth (INDEPENDENT_AMBULATORY_CARE_PROVIDER_SITE_OTHER): Payer: Medicare HMO | Admitting: Family Medicine

## 2020-05-03 ENCOUNTER — Encounter: Payer: Self-pay | Admitting: Family Medicine

## 2020-05-03 VITALS — Wt 148.0 lb

## 2020-05-03 DIAGNOSIS — R52 Pain, unspecified: Secondary | ICD-10-CM | POA: Diagnosis not present

## 2020-05-03 DIAGNOSIS — J3489 Other specified disorders of nose and nasal sinuses: Secondary | ICD-10-CM | POA: Diagnosis not present

## 2020-05-03 DIAGNOSIS — R059 Cough, unspecified: Secondary | ICD-10-CM

## 2020-05-03 LAB — LIPID PANEL
Chol/HDL Ratio: 2.9 ratio (ref 0.0–5.0)
Cholesterol, Total: 185 mg/dL (ref 100–199)
HDL: 63 mg/dL (ref >39)
LDL Chol Calc (NIH): 108 mg/dL — ABNORMAL HIGH (ref 0–99)
Triglycerides: 75 mg/dL (ref 0–149)
VLDL Cholesterol Cal: 14 mg/dL (ref 5–40)

## 2020-05-03 LAB — VITAMIN D 25 HYDROXY (VIT D DEFICIENCY, FRACTURES): Vit D, 25-Hydroxy: 25.1 ng/mL — ABNORMAL LOW (ref 30.0–100.0)

## 2020-05-03 NOTE — Progress Notes (Signed)
   Subjective:  Documentation for virtual audio and video telecommunications through [place] encounter:  The patient was located at home. 2 patient identifiers used.  The provider was located in the office. The patient did consent to this visit and is aware of possible charges through their insurance for this visit.  The other persons participating in this telemedicine service were none. Time spent on call was 16 minutes and in review of previous records 20 minutes total.  This virtual service is not related to other E/M service within previous 7 days.   Patient ID: Tyler Wu, male    DOB: 11-20-1945, 75 y.o.   MRN: [id number]  HPI Chief Complaint  Patient presents with  . possible Flu    Possible Flu, started last night. Congested, cough, no fever, sinus pressure and nasal drip. Took covid test and both negative.   Complains of a 24 hour history of acute onset of nasal congestion, fatigue, body aches, frontal headache, sinus pressure, post nasal drainage and coughing.  States she feels like he has the flu and would like to be tested.   Took a Zyrtec D and Claritin.  Nyquil.   Tessalon at home.  No fever, dizziness, chest pain, shortness of breath, abdominal pain, N/V/D.  Denies history of lung disease. Does not smoke.   States he had Covid in September.   Reviewed allergies, medications, past medical, surgical, family, and social history.   Review of Systems Pertinent positives and negatives in the history of present illness.     Objective:   Physical Exam Wt 148 lb (67.1 kg)   BMI 23.18 kg/m   Alert and oriented and in no acute distress.  Respirations unlabored.  Speaking in complete sentences without difficulty.      Assessment & Plan:  Cough  Body aches  Sinus pressure  Discussed that I am unable to test him for flu or Covid but his symptoms do appear to be viral.  Discussed symptomatic treatment and recommend he try to get tested at a local  pharmacy.  It may also come in Monday for testing since it would be day 5 of his symptoms.

## 2020-05-04 DIAGNOSIS — Z20822 Contact with and (suspected) exposure to covid-19: Secondary | ICD-10-CM | POA: Diagnosis not present

## 2020-05-06 ENCOUNTER — Other Ambulatory Visit: Payer: Self-pay | Admitting: Medical

## 2020-05-06 MED ORDER — VITAMIN D (ERGOCALCIFEROL) 1.25 MG (50000 UNIT) PO CAPS: 50000.0000 [IU] | ORAL_CAPSULE | ORAL | 0 refills | Status: DC

## 2020-05-06 NOTE — Progress Notes (Signed)
Referral has been sent.

## 2020-05-08 ENCOUNTER — Other Ambulatory Visit: Payer: Self-pay | Admitting: Medical

## 2020-05-08 ENCOUNTER — Telehealth: Payer: Self-pay | Admitting: Medical

## 2020-05-08 MED ORDER — OSELTAMIVIR PHOSPHATE 75 MG PO CAPS: 75.0000 mg | ORAL_CAPSULE | Freq: Two times a day (BID) | ORAL | 0 refills | Status: AC

## 2020-05-08 NOTE — Telephone Encounter (Signed)
error 

## 2020-06-06 ENCOUNTER — Other Ambulatory Visit: Payer: Self-pay | Admitting: Medical

## 2020-06-07 ENCOUNTER — Other Ambulatory Visit: Payer: Self-pay | Admitting: Hematology and Oncology

## 2020-06-07 DIAGNOSIS — C9110 Chronic lymphocytic leukemia of B-cell type not having achieved remission: Secondary | ICD-10-CM

## 2020-06-10 ENCOUNTER — Encounter: Payer: Self-pay | Admitting: Hematology and Oncology

## 2020-06-10 ENCOUNTER — Other Ambulatory Visit: Payer: Self-pay

## 2020-06-10 ENCOUNTER — Inpatient Hospital Stay: Payer: Medicare HMO | Attending: Hematology and Oncology | Admitting: Hematology and Oncology

## 2020-06-10 DIAGNOSIS — Z85828 Personal history of other malignant neoplasm of skin: Secondary | ICD-10-CM

## 2020-06-10 DIAGNOSIS — C9110 Chronic lymphocytic leukemia of B-cell type not having achieved remission: Secondary | ICD-10-CM | POA: Diagnosis not present

## 2020-06-10 NOTE — Assessment & Plan Note (Signed)
He has history of skin cancer and is noted to have significant seborrheic keratosis throughout Recommend the patient to avoid excessive sun exposure It is not acceptable to be mowing the lawn in the middle of the day even with wearing a hat and sunscreen as it may not be adequate to reduce his risk of skin cancer He is at high risk of skin cancer due to chronic lymphocytic leukemia and strong family history of melanoma

## 2020-06-10 NOTE — Assessment & Plan Note (Signed)
The palpable lymphadenopathy in the submandibular region is small According to the patient, it felt smaller since last time he was seen I recommend close monitoring for now

## 2020-06-10 NOTE — Progress Notes (Signed)
[place] OFFICE PROGRESS NOTE  Patient Care Team: Tysinger, Camelia Eng, PA-C as PCP - General (Family Medicine) Heath Lark, MD as Consulting Physician (Hematology and Oncology)  ASSESSMENT & PLAN:  CLL (chronic lymphocytic leukemia) ([place]) The palpable lymphadenopathy in the submandibular region is small According to the patient, it felt smaller since last time he was seen I recommend close monitoring for now  History of skin cancer He has history of skin cancer and is noted to have significant seborrheic keratosis throughout Recommend the patient to avoid excessive sun exposure It is not acceptable to be mowing the lawn in the middle of the day even with wearing a hat and sunscreen as it may not be adequate to reduce his risk of skin cancer He is at high risk of skin cancer due to chronic lymphocytic leukemia and strong family history of melanoma   No orders of the defined types were placed in this encounter.   All questions were answered. The patient knows to call the clinic with any problems, questions or concerns. The total time spent in the appointment was 20 minutes encounter with patients including review of chart and various tests results, discussions about plan of care and coordination of care plan   Heath Lark, MD 06/10/2020 11:58 AM  INTERVAL HISTORY: Please see below for problem oriented charting. He returns for further follow-up He was started on vitamin D supplement recently He noted that the submandibular lymph node is getting a bit smaller He had recent flare of his seborrheic keratosis on the left side of his face but that has gotten better He saw his dermatologist recently The patient is very active; he mows his yard once a week and goes golfing He typically mows his yard in the middle of the day with sunscreen He does wear hat on a regular basis He has no new lymphadenopathy elsewhere  SUMMARY OF ONCOLOGIC HISTORY: Oncology History  CLL  (chronic lymphocytic leukemia) ([place])  04/18/2003 Pathology Results   FLOW CYTOMETRIC ANALYSIS OF THE LYMPHOID POPULATION SHOWS A MONOCLONAL, LAMBDA RESTRICTED (DIM) B-CELL POPULATION EXPRESSING PAN B-CELL ANTIGENS INCLUDING CD20 AND CD23. THIS IS ASSOCIATED WITH COEXPRESSION OF CD5 AND CD20. NO CD10 EXPRESSION IS IDENTIFIED. THE FEATURES ARE COMPATIBLE WITH CHRONIC LYMPHOCYTIC  LEUKEMIA.   09/05/2003 Pathology Results   FLOW CYTOMETRY ANALYSIS OF THE CHRONIC LYMPHOCYTIC LEUKEMIA POPULATION SHOWS THAT APPROXIMATELY 18% OF THE CELLS ARE POSITIVE FOR ZAP-70. AS 20% IS THE CUT OFF FOR POSITIVITY, THIS IS INTERPRETED AS A BORDERLINE/NEGATIVE RESULT   11/23/2018 Cancer Staging   Staging form: Chronic Lymphocytic Leukemia / Small Lymphocytic Lymphoma, AJCC 8th Edition - Clinical stage from 11/23/2018: Modified Rai Stage I (Modified Rai risk: Intermediate, Lymphocytosis: Present, Adenopathy: Present, Organomegaly: Absent, Anemia: Absent, Thrombocytopenia: Absent) - Signed by Heath Lark, MD on 11/23/2018     REVIEW OF SYSTEMS:   Constitutional: Denies fevers, chills or abnormal weight loss Eyes: Denies blurriness of vision Ears, nose, mouth, throat, and face: Denies mucositis or sore throat Respiratory: Denies cough, dyspnea or wheezes Cardiovascular: Denies palpitation, chest discomfort or lower extremity swelling Gastrointestinal:  Denies nausea, heartburn or change in bowel habits Neurological:Denies numbness, tingling or new weaknesses Behavioral/Psych: Mood is stable, no new changes  All other systems were reviewed with the patient and are negative.  I have reviewed the past medical history, past surgical history, social history and family history with the patient and they are unchanged from previous note.  ALLERGIES:  is allergic to levofloxacin.  MEDICATIONS:  Current Outpatient Medications  Medication Sig Dispense Refill  . aspirin 81 MG chewable tablet Chew 81 mg by mouth.    .  loratadine (CLARITIN) 10 MG tablet     . tamsulosin (FLOMAX) 0.4 MG CAPS capsule TAKE 1 CAPSULE(0.4 MG) BY MOUTH DAILY AFTER AND SUPPER 30 capsule 2  . Vitamin D, Ergocalciferol, (DRISDOL) 1.25 MG (50000 UNIT) CAPS capsule Take 1 capsule (50,000 Units total) by mouth every 7 (seven) days. 12 capsule 0   No current facility-administered medications for this visit.    PHYSICAL EXAMINATION: ECOG PERFORMANCE STATUS: 0 - Asymptomatic  Vitals:   06/10/20 1146  BP: 127/71  Pulse: 65  Resp: 18  Temp: 97.6 F (36.4 C)  SpO2: 99%   Filed Weights   06/10/20 1146  Weight: 145 lb 9.6 oz (66 kg)    GENERAL:alert, no distress and comfortable SKIN: Noticeable right keratosis on his face EYES: normal, Conjunctiva are pink and non-injected, sclera clear OROPHARYNX:no exudate, no erythema and lips, buccal mucosa, and tongue normal  NECK: supple, thyroid normal size, non-tender, without nodularity LYMPH: He has palpable submandibular lymphadenopathy on the left side LUNGS: clear to auscultation and percussion with normal breathing effort HEART: regular rate & rhythm and no murmurs and no lower extremity edema ABDOMEN:abdomen soft, non-tender and normal bowel sounds Musculoskeletal:no cyanosis of digits and no clubbing  NEURO: alert & oriented x 3 with fluent speech, no focal motor/sensory deficits  LABORATORY DATA:  I have reviewed the data as listed    Component Value Date/Time   NA 143 03/08/2020 1042   NA 140 11/22/2014 1320   K 4.4 03/08/2020 1042   K 3.8 11/22/2014 1320   CL 105 03/08/2020 1042   CL 107 11/20/2011 1011   CO2 22 03/08/2020 1042   CO2 24 11/22/2014 1320   GLUCOSE 86 03/08/2020 1042   GLUCOSE 91 11/23/2017 1233   GLUCOSE 154 (H) 11/22/2014 1320   GLUCOSE 87 11/20/2011 1011   BUN 10 03/08/2020 1042   BUN 10.5 11/22/2014 1320   CREATININE 0.99 03/08/2020 1042   CREATININE 1.29 (H) 03/14/2015 0841   CREATININE 1.3 11/22/2014 1320   CALCIUM 9.4 03/08/2020 1042    CALCIUM 9.1 11/22/2014 1320   PROT 6.2 03/08/2020 1042   PROT 6.4 11/22/2014 1320   ALBUMIN 4.5 03/08/2020 1042   ALBUMIN 4.1 11/22/2014 1320   AST 14 03/08/2020 1042   AST 13 11/22/2014 1320   ALT 17 03/08/2020 1042   ALT 14 11/22/2014 1320   ALKPHOS 66 03/08/2020 1042   ALKPHOS 67 11/22/2014 1320   BILITOT 0.3 03/08/2020 1042   BILITOT 0.64 11/22/2014 1320   GFRNONAA 75 03/08/2020 1042   GFRAA 86 03/08/2020 1042    No results found for: SPEP, UPEP  Lab Results  Component Value Date   WBC 22.9 (HH) 03/08/2020   NEUTROABS 5.0 03/08/2020   HGB 14.5 03/08/2020   HCT 44.7 03/08/2020   MCV 89 03/08/2020   PLT 180 03/08/2020      Chemistry      Component Value Date/Time   NA 143 03/08/2020 1042   NA 140 11/22/2014 1320   K 4.4 03/08/2020 1042   K 3.8 11/22/2014 1320   CL 105 03/08/2020 1042   CL 107 11/20/2011 1011   CO2 22 03/08/2020 1042   CO2 24 11/22/2014 1320   BUN 10 03/08/2020 1042   BUN 10.5 11/22/2014 1320   CREATININE 0.99 03/08/2020 1042   CREATININE 1.29 (H) 03/14/2015 1308  CREATININE 1.3 11/22/2014 1320      Component Value Date/Time   CALCIUM 9.4 03/08/2020 1042   CALCIUM 9.1 11/22/2014 1320   ALKPHOS 66 03/08/2020 1042   ALKPHOS 67 11/22/2014 1320   AST 14 03/08/2020 1042   AST 13 11/22/2014 1320   ALT 17 03/08/2020 1042   ALT 14 11/22/2014 1320   BILITOT 0.3 03/08/2020 1042   BILITOT 0.64 11/22/2014 1320

## 2020-08-01 ENCOUNTER — Other Ambulatory Visit: Payer: Self-pay | Admitting: Medical

## 2020-08-02 NOTE — Telephone Encounter (Signed)
Let schedule follow-up so I can recheck vitamin D

## 2020-08-06 DIAGNOSIS — H6123 Impacted cerumen, bilateral: Secondary | ICD-10-CM | POA: Diagnosis not present

## 2020-08-06 DIAGNOSIS — R55 Syncope and collapse: Secondary | ICD-10-CM | POA: Diagnosis not present

## 2020-08-06 DIAGNOSIS — U071 COVID-19: Secondary | ICD-10-CM | POA: Insufficient documentation

## 2020-08-07 ENCOUNTER — Ambulatory Visit: Payer: Medicare HMO | Admitting: Family Medicine

## 2020-08-26 ENCOUNTER — Other Ambulatory Visit: Payer: Self-pay

## 2020-08-26 ENCOUNTER — Ambulatory Visit (INDEPENDENT_AMBULATORY_CARE_PROVIDER_SITE_OTHER): Payer: Medicare HMO | Admitting: Medical

## 2020-08-26 VITALS — BP 120/70 | HR 61 | Wt 144.0 lb

## 2020-08-26 DIAGNOSIS — I700 Atherosclerosis of aorta: Secondary | ICD-10-CM | POA: Diagnosis not present

## 2020-08-26 DIAGNOSIS — C9110 Chronic lymphocytic leukemia of B-cell type not having achieved remission: Secondary | ICD-10-CM | POA: Diagnosis not present

## 2020-08-26 DIAGNOSIS — N400 Enlarged prostate without lower urinary tract symptoms: Secondary | ICD-10-CM | POA: Diagnosis not present

## 2020-08-26 DIAGNOSIS — E559 Vitamin D deficiency, unspecified: Secondary | ICD-10-CM | POA: Insufficient documentation

## 2020-08-26 MED ORDER — TAMSULOSIN HCL 0.4 MG PO CAPS: ORAL_CAPSULE | ORAL | 3 refills | Status: DC

## 2020-08-26 NOTE — Patient Instructions (Signed)
I recommend starting Crestor '10mg'$  daily for heart disease primary prevention given the finding of atherosclerosis on your 2021 scan of chest.   This is like an insurance policy to reduce risk of heart disease and stroke along with healthy low cholesterol diet   Atherosclerosis  Atherosclerosis is narrowing and hardening of the arteries. Arteries are blood vessels that carry blood from the heart to all parts of the body. This blood contains oxygen. Arteries can become narrow or blocked from inflammation or from a buildup of fat, cholesterol, calcium, and other substances (plaque). Plaque decreases the amount of blood that can flow through the artery. Atherosclerosis can affect any artery in your body, including: Heart arteries. Damage to these arteries may lead to coronary artery disease, which can cause a heart attack. Brain arteries. Damage to these arteries may cause a stroke. Leg, arm, and pelvis arteries. Peripheral artery disease (PAD) may result from damage to these arteries. Kidney arteries. Kidney (renal) failure may result from damage to kidney arteries. Treatment may slow the disease and prevent further damage to your heart, brain,peripheral arteries, and kidneys. What are the causes? This condition develops slowly over many years. The inner layers of your arteries become damaged and allow the gradual buildup of plaque. The exact cause of atherosclerosis is not fully understood. Symptoms of atherosclerosis not occur until an artery becomes narrow or blocked. What increases the risk? The following factors may make you more likely to develop this condition: Being middle-aged or older. Certain medical conditions, including: High blood pressure. High cholesterol. High blood fats (triglycerides). Diabetes. Sleep apnea. A family history of atherosclerosis. Being overweight. Using products that contain tobacco or nicotine. Not exercising enough (sedentary lifestyle). Having a  substance in your blood called C-reactive protein (CRP). This is a sign of increased levels of inflammation in your body. Being stressed. Drinking too much alcohol or using drugs, such as cocaine or methamphetamine. What are the signs or symptoms? Symptoms of atherosclerosis may not be obvious until there is damage to an area of your body that is not getting enough blood. Sometimes, atherosclerosis doesnot cause symptoms. Symptoms of this condition include: Coronary artery disease. This may cause chest pain and shortness of breath. Decreased blood supply to your brain, which may cause a stroke. Signs of a stroke may include sudden: Weakness or numbness in your face, arm, or leg, especially on one side of your body. Trouble walking or difficulty moving your arms or legs. Loss of balance or coordination. Confusion. Slurred speech (dysarthria). Trouble speaking, or trouble understanding speech, or both (aphasia). Vision changes in one or both eyes. This may be double vision, blurred vision, or loss of vision. Severe headache with no known cause. The headache is often described as the worst headache ever experienced. PAD, which may cause pain and numbness, often in your legs and hips. Renal failure. This may cause tiredness, problems with urination, swelling, and itchy skin. How is this diagnosed? This condition is diagnosed based on your medical history and a physical exam. During the exam, your health care provider will: Check your pulse in different places. Listen for a "whooshing" sound over your arteries (bruit). You may also have tests, such as: Blood tests to check your levels of cholesterol, triglycerides, and CRP. Electrocardiogram (ECG) to check for heart damage. Chest X-ray to see if you have an enlarged heart, which is a sign of heart failure. Stress test to see how your heart reacts to exercise. Echocardiogram to get images of the inside  of your heart. Ankle-brachial index to  compare blood pressure in your arms to blood pressure in your ankles. Ultrasound of your peripheral arteries to check blood flow. CT scan to check for damage to your heart or brain. X-rays of blood vessels after dye has been injected (angiogram) to check blood flow. How is this treated? This condition is treated with lifestyle changes as the first step. These may include: Changing your diet. Losing weight. Reducing stress. Exercising and being physically active more regularly. Quitting smoking. You may also need medicine to: Lower triglycerides and cholesterol. Control blood pressure. Prevent blood clots. Lower inflammation in your body. Control your blood sugar. Sometimes, surgery is needed to: Remove plaque from an artery (endarterectomy). Open or widen a narrowed heart artery (angioplasty). Create a new path for your blood with one of these procedures: Heart (coronary) artery bypass graft surgery. Peripheral artery bypass graft surgery. Follow these instructions at home: Eating and drinking  Eat a heart-healthy diet. Talk with your health care provider or a diet and nutrition specialist (dietitian) if you need help. A heart-healthy diet involves: Limiting unhealthy fats and increasing healthy fats. Some examples of healthy fats are olive oil and canola oil. Eating plant-based foods, such as fruits, vegetables, nuts, whole grains, and legumes (such as peas and lentils). If you drink alcohol: Limit how much you use to: 0-1 drink a day for nonpregnant women. 0-2 drinks a day for men. Be aware of how much alcohol is in your drink. In the [place] one drink equals one 12 oz bottle of beer (355 mL), one 5 oz glass of wine (148 mL), or one 1 oz glass of hard liquor (44 mL).  Lifestyle  Maintain a healthy weight. Lose weight if your health care provider says that you need to do that. Follow an exercise program as told by your health care provider. Do not use any products that contain  nicotine or tobacco, such as cigarettes, e-cigarettes, and chewing tobacco. If you need help quitting, ask your health care provider. Do not use drugs.  General instructions Take over-the-counter and prescription medicines only as told by your health care provider. Manage other health conditions as told. Keep all follow-up visits as told by your health care provider. This is important. Contact a health care provider if you have: An irregular heartbeat. Unexplained fatigue. Trouble urinating, or you are producing less urine or foamy urine. Swelling of your hands or feet, or itchy skin. Unexplained pain or numbness in your legs or hips. Get help right away if: You have any symptoms of a heart attack. These may be: Chest pain. This includes squeezing chest pain that may feel like indigestion (angina). Shortness of breath. Pain in your neck, jaw, arms, back, or stomach. Cold sweat. Nausea. Light-headedness. You have any symptoms of a stroke. "BE FAST" is an easy way to remember the main warning signs of a stroke: B - Balance. Signs are dizziness, sudden trouble walking, or loss of balance. E - Eyes. Signs are trouble seeing or a sudden change in vision. F - Face. Signs are sudden weakness or numbness of the face, or the face or eyelid drooping on one side. A - Arms. Signs are weakness or numbness in an arm. This happens suddenly and usually on one side of the body. S - Speech. Signs are sudden trouble speaking, slurred speech, or trouble understanding what people say. T - Time. Time to call emergency services. Write down what time symptoms started. You have other signs  of a stroke, such as: A sudden, severe headache with no known cause. Nausea or vomiting. Seizure. These symptoms may represent a serious problem that is an emergency. Do not wait to see if the symptoms will go away. Get medical help right away. Call your local emergency services (911 in the [place] Do not drive yourself to  the hospital. Summary Atherosclerosis is narrowing and hardening of the arteries. Arteries can become narrow from inflammation or from a buildup of fat, cholesterol, calcium, and other substances (plaque). This condition may not cause any symptoms. If you do have symptoms, they are caused by damage to an area of your body that is not getting enough blood. Treatment starts with lifestyle changes and may include medicines. In some cases, surgery is needed. Get help right away if you have any symptoms of a heart attack or stroke. This information is not intended to replace advice given to you by your health care provider. Make sure you discuss any questions you have with your healthcare provider. Document Revised: 11/21/2018 Document Reviewed: 11/21/2018 Elsevier Patient Education  [place].

## 2020-08-26 NOTE — Progress Notes (Signed)
Subjective:  Tyler Wu is a 75 y.o. male who presents for Chief Complaint  Patient presents with   follow-up    Follow-up on vitamin d. Would like a refill on flomax as well     Had covid infection recently.  Was seeing through urgent care, on Paxlovid.  Had mild cold symptoms.   He was off Flomax temporarily due to interaction with paxlovid.    Here for follow-up on vitamin D.  Back in April his vitamin D was low.  We started on 50,000 weekly dosing.  He completed 3 months of the prescription.  He is compliant with this.  At that time we discussed cholesterol and atherosclerosis.  We had recommended cholesterol medication to lower risk, but he declined at that time  He requests a refill on Flomax.  He has history of large prostate.  Has had some daytime urgency, more nocturia when he was off the medication short term recently while on Paxlovid for covid.  No other aggravating or relieving factors.    No other c/o.  Past Medical History:  Diagnosis Date   Allergy    Aortic atherosclerosis ([place]) 10/2019   per CT chest   BPH (benign prostatic hyperplasia)    CLL (chronic lymphocytic leukemia) ([place]) 2005   followed by oncology yearly.   History of COVID-19 2021   Current Outpatient Medications on File Prior to Visit  Medication Sig Dispense Refill   aspirin 81 MG chewable tablet Chew 81 mg by mouth.     loratadine (CLARITIN) 10 MG tablet      No current facility-administered medications on file prior to visit.   Current Outpatient Medications on File Prior to Visit  Medication Sig Dispense Refill   aspirin 81 MG chewable tablet Chew 81 mg by mouth.     loratadine (CLARITIN) 10 MG tablet      No current facility-administered medications on file prior to visit.    The following portions of the patient's history were reviewed and updated as appropriate: allergies, current medications, past family history, past medical history, past social history, past surgical history and  problem list.  ROS Otherwise as in subjective above    Objective: BP 120/70   Pulse 61   Wt 144 lb (65.3 kg)   BMI 22.55 kg/m   General appearance: alert, no distress, well developed, well nourished Otherwise not examined     Assessment: Encounter Diagnoses  Name Primary?   Aortic atherosclerosis ([facility]) Yes   Vitamin D deficiency    Enlarged prostate    CLL (chronic lymphocytic leukemia) ([place])       Plan: Aortic atherosclerosis-we discussed findings on chest CT done in October 2021.  We had already recommended statin medication back in April.  He declined at that time. He still declines medication today.   We discussed ways to reduce cholesterol buildup, health diet and exercise  Vitamin D deficiency-he had been taking vitamin D 50,000 units weekly since April, x 3 mo.  Recheck labs today.  Counseled on vitamin D in the diet  History of enlarged prostate-refill Flomax due to LUTS symptoms  CLL-managed by oncology   Tyler Wu was seen today for follow-up.  Diagnoses and all orders for this visit:  Aortic atherosclerosis ([place])  Vitamin D deficiency -     VITAMIN D 25 Hydroxy (Vit-D Deficiency, Fractures) -     Comprehensive metabolic panel  Enlarged prostate  CLL (chronic lymphocytic leukemia) ([place])  Other orders -     tamsulosin (  FLOMAX) 0.4 MG CAPS capsule; TAKE 1 CAPSULE(0.4 MG) BY MOUTH DAILY AFTER AND SUPPER   Follow up: pending labs

## 2020-08-27 LAB — COMPREHENSIVE METABOLIC PANEL
ALT: 16 IU/L (ref 0–44)
AST: 15 IU/L (ref 0–40)
Albumin/Globulin Ratio: 2.8 — ABNORMAL HIGH (ref 1.2–2.2)
Albumin: 4.5 g/dL (ref 3.7–4.7)
Alkaline Phosphatase: 73 IU/L (ref 44–121)
BUN/Creatinine Ratio: 12 (ref 10–24)
BUN: 13 mg/dL (ref 8–27)
Bilirubin Total: 0.5 mg/dL (ref 0.0–1.2)
CO2: 25 mmol/L (ref 20–29)
Calcium: 9.5 mg/dL (ref 8.6–10.2)
Chloride: 104 mmol/L (ref 96–106)
Creatinine, Ser: 1.09 mg/dL (ref 0.76–1.27)
Globulin, Total: 1.6 g/dL (ref 1.5–4.5)
Glucose: 88 mg/dL (ref 65–99)
Potassium: 4.8 mmol/L (ref 3.5–5.2)
Sodium: 142 mmol/L (ref 134–144)
Total Protein: 6.1 g/dL (ref 6.0–8.5)
eGFR: 71 mL/min/{1.73_m2} (ref >59)

## 2020-08-27 LAB — VITAMIN D 25 HYDROXY (VIT D DEFICIENCY, FRACTURES): Vit D, 25-Hydroxy: 38.4 ng/mL (ref 30.0–100.0)

## 2020-11-25 ENCOUNTER — Encounter: Payer: Self-pay | Admitting: Hematology and Oncology

## 2020-11-25 ENCOUNTER — Other Ambulatory Visit: Payer: Self-pay

## 2020-11-25 ENCOUNTER — Inpatient Hospital Stay (HOSPITAL_BASED_OUTPATIENT_CLINIC_OR_DEPARTMENT_OTHER): Payer: Medicare HMO | Admitting: Hematology and Oncology

## 2020-11-25 ENCOUNTER — Inpatient Hospital Stay: Payer: Medicare HMO | Attending: Hematology and Oncology

## 2020-11-25 DIAGNOSIS — R590 Localized enlarged lymph nodes: Secondary | ICD-10-CM | POA: Insufficient documentation

## 2020-11-25 DIAGNOSIS — R591 Generalized enlarged lymph nodes: Secondary | ICD-10-CM | POA: Diagnosis not present

## 2020-11-25 DIAGNOSIS — Z7982 Long term (current) use of aspirin: Secondary | ICD-10-CM | POA: Diagnosis not present

## 2020-11-25 DIAGNOSIS — D696 Thrombocytopenia, unspecified: Secondary | ICD-10-CM | POA: Insufficient documentation

## 2020-11-25 DIAGNOSIS — S6990XS Unspecified injury of unspecified wrist, hand and finger(s), sequela: Secondary | ICD-10-CM

## 2020-11-25 DIAGNOSIS — C9110 Chronic lymphocytic leukemia of B-cell type not having achieved remission: Secondary | ICD-10-CM

## 2020-11-25 DIAGNOSIS — S6990XA Unspecified injury of unspecified wrist, hand and finger(s), initial encounter: Secondary | ICD-10-CM | POA: Insufficient documentation

## 2020-11-25 HISTORY — DX: Unspecified injury of unspecified wrist, hand and finger(s), initial encounter: S69.90XA

## 2020-11-25 LAB — CBC WITH DIFFERENTIAL/PLATELET
Abs Immature Granulocytes: 0.02 10*3/uL (ref 0.00–0.07)
Basophils Absolute: 0.1 10*3/uL (ref 0.0–0.1)
Basophils Relative: 0 %
Eosinophils Absolute: 0.1 10*3/uL (ref 0.0–0.5)
Eosinophils Relative: 1 %
HCT: 43.9 % (ref 39.0–52.0)
Hemoglobin: 14 g/dL (ref 13.0–17.0)
Immature Granulocytes: 0 %
Lymphocytes Relative: 76 %
Lymphs Abs: 15.2 10*3/uL — ABNORMAL HIGH (ref 0.7–4.0)
MCH: 29.2 pg (ref 26.0–34.0)
MCHC: 31.9 g/dL (ref 30.0–36.0)
MCV: 91.5 fL (ref 80.0–100.0)
Monocytes Absolute: 0.5 10*3/uL (ref 0.1–1.0)
Monocytes Relative: 3 %
Neutro Abs: 3.9 10*3/uL (ref 1.7–7.7)
Neutrophils Relative %: 20 %
Platelets: 141 10*3/uL — ABNORMAL LOW (ref 150–400)
RBC: 4.8 MIL/uL (ref 4.22–5.81)
RDW: 13.6 % (ref 11.5–15.5)
WBC: 19.8 10*3/uL — ABNORMAL HIGH (ref 4.0–10.5)
nRBC: 0 % (ref 0.0–0.2)

## 2020-11-25 NOTE — Assessment & Plan Note (Signed)
They are unchanged in size Observe only

## 2020-11-25 NOTE — Assessment & Plan Note (Signed)
The palpable lymphadenopathy in the submandibular region is small and stable in size There is no appreciable progression of CLL I recommend close monitoring for now

## 2020-11-25 NOTE — Assessment & Plan Note (Signed)
It does not appear infected Observe only

## 2020-11-25 NOTE — Progress Notes (Signed)
[facility] OFFICE PROGRESS NOTE  Patient Care Team: Tysinger, Camelia Eng, PA-C as PCP - General (Family Medicine) Heath Lark, MD as Consulting Physician (Hematology and Oncology)  ASSESSMENT & PLAN:  CLL (chronic lymphocytic leukemia) ([place]) The palpable lymphadenopathy in the submandibular region is small and stable in size There is no appreciable progression of CLL I recommend close monitoring for now  Thrombocytopenia [facility]) This is mild, could be due to CLL He is not symptomatic Observed only  Lymphadenopathy of head and neck They are unchanged in size Observe only  Injury of nail bed of finger It does not appear infected Observe only  No orders of the defined types were placed in this encounter.   All questions were answered. The patient knows to call the clinic with any problems, questions or concerns. The total time spent in the appointment was 20 minutes encounter with patients including review of chart and various tests results, discussions about plan of care and coordination of care plan   Heath Lark, MD 11/25/2020 3:56 PM  INTERVAL HISTORY: Please see below for problem oriented charting. he returns for follow-up on CLL He is doing well Denies any growth in his lymphadenopathy He hurt his nail bed recently and place a Band-Aid over it He denies recent infection, fever or chills  REVIEW OF SYSTEMS:   Constitutional: Denies fevers, chills or abnormal weight loss Eyes: Denies blurriness of vision Ears, nose, mouth, throat, and face: Denies mucositis or sore throat Respiratory: Denies cough, dyspnea or wheezes Cardiovascular: Denies palpitation, chest discomfort or lower extremity swelling Gastrointestinal:  Denies nausea, heartburn or change in bowel habits Neurological:Denies numbness, tingling or new weaknesses Behavioral/Psych: Mood is stable, no new changes  All other systems were reviewed with the patient and are negative.  I have reviewed  the past medical history, past surgical history, social history and family history with the patient and they are unchanged from previous note.  ALLERGIES:  is allergic to levofloxacin.  MEDICATIONS:  Current Outpatient Medications  Medication Sig Dispense Refill   aspirin 81 MG chewable tablet Chew 81 mg by mouth.     loratadine (CLARITIN) 10 MG tablet      tamsulosin (FLOMAX) 0.4 MG CAPS capsule TAKE 1 CAPSULE(0.4 MG) BY MOUTH DAILY AFTER AND SUPPER 90 capsule 3   No current facility-administered medications for this visit.    SUMMARY OF ONCOLOGIC HISTORY: Oncology History  CLL (chronic lymphocytic leukemia) ([place])  04/18/2003 Pathology Results   FLOW CYTOMETRIC ANALYSIS OF THE LYMPHOID POPULATION SHOWS A MONOCLONAL, LAMBDA RESTRICTED (DIM) B-CELL POPULATION EXPRESSING PAN B-CELL ANTIGENS INCLUDING CD20 AND CD23. THIS IS ASSOCIATED WITH COEXPRESSION OF CD5 AND CD20. NO CD10 EXPRESSION IS IDENTIFIED. THE FEATURES ARE COMPATIBLE WITH CHRONIC LYMPHOCYTIC  LEUKEMIA.   09/05/2003 Pathology Results   FLOW CYTOMETRY ANALYSIS OF THE CHRONIC LYMPHOCYTIC LEUKEMIA POPULATION SHOWS THAT APPROXIMATELY 18% OF THE CELLS ARE POSITIVE FOR ZAP-70. AS 20% IS THE CUT OFF FOR POSITIVITY, THIS IS INTERPRETED AS A BORDERLINE/NEGATIVE RESULT   11/23/2018 Cancer Staging   Staging form: Chronic Lymphocytic Leukemia / Small Lymphocytic Lymphoma, AJCC 8th Edition - Clinical stage from 11/23/2018: Modified Rai Stage I (Modified Rai risk: Intermediate, Lymphocytosis: Present, Adenopathy: Present, Organomegaly: Absent, Anemia: Absent, Thrombocytopenia: Absent) - Signed by Heath Lark, MD on 11/23/2018      PHYSICAL EXAMINATION: ECOG PERFORMANCE STATUS: 1 - Symptomatic but completely ambulatory  Vitals:   11/25/20 1256  BP: 130/69  Pulse: 62  Resp: 17  Temp: 97.6 F (36.4 C)  SpO2: 100%   Filed Weights   11/25/20 1256  Weight: 143 lb 3.2 oz (65 kg)    GENERAL:alert, no distress and comfortable SKIN:  The nailbed does not look infected EYES: normal, Conjunctiva are pink and non-injected, sclera clear OROPHARYNX:no exudate, no erythema and lips, buccal mucosa, and tongue normal  NECK: supple, thyroid normal size, non-tender, without nodularity LYMPH: Previously palpable lymphadenopathy in the submandibular region is unchanged LUNGS: clear to auscultation and percussion with normal breathing effort HEART: regular rate & rhythm and no murmurs and no lower extremity edema ABDOMEN:abdomen soft, non-tender and normal bowel sounds Musculoskeletal:no cyanosis of digits and no clubbing  NEURO: alert & oriented x 3 with fluent speech, no focal motor/sensory deficits  LABORATORY DATA:  I have reviewed the data as listed    Component Value Date/Time   NA 142 08/26/2020 1047   NA 140 11/22/2014 1320   K 4.8 08/26/2020 1047   K 3.8 11/22/2014 1320   CL 104 08/26/2020 1047   CL 107 11/20/2011 1011   CO2 25 08/26/2020 1047   CO2 24 11/22/2014 1320   GLUCOSE 88 08/26/2020 1047   GLUCOSE 91 11/23/2017 1233   GLUCOSE 154 (H) 11/22/2014 1320   GLUCOSE 87 11/20/2011 1011   BUN 13 08/26/2020 1047   BUN 10.5 11/22/2014 1320   CREATININE 1.09 08/26/2020 1047   CREATININE 1.29 (H) 03/14/2015 0841   CREATININE 1.3 11/22/2014 1320   CALCIUM 9.5 08/26/2020 1047   CALCIUM 9.1 11/22/2014 1320   PROT 6.1 08/26/2020 1047   PROT 6.4 11/22/2014 1320   ALBUMIN 4.5 08/26/2020 1047   ALBUMIN 4.1 11/22/2014 1320   AST 15 08/26/2020 1047   AST 13 11/22/2014 1320   ALT 16 08/26/2020 1047   ALT 14 11/22/2014 1320   ALKPHOS 73 08/26/2020 1047   ALKPHOS 67 11/22/2014 1320   BILITOT 0.5 08/26/2020 1047   BILITOT 0.64 11/22/2014 1320   GFRNONAA 75 03/08/2020 1042   GFRAA 86 03/08/2020 1042    No results found for: SPEP, UPEP  Lab Results  Component Value Date   WBC 19.8 (H) 11/25/2020   NEUTROABS 3.9 11/25/2020   HGB 14.0 11/25/2020   HCT 43.9 11/25/2020   MCV 91.5 11/25/2020   PLT 141 (L)  11/25/2020      Chemistry      Component Value Date/Time   NA 142 08/26/2020 1047   NA 140 11/22/2014 1320   K 4.8 08/26/2020 1047   K 3.8 11/22/2014 1320   CL 104 08/26/2020 1047   CL 107 11/20/2011 1011   CO2 25 08/26/2020 1047   CO2 24 11/22/2014 1320   BUN 13 08/26/2020 1047   BUN 10.5 11/22/2014 1320   CREATININE 1.09 08/26/2020 1047   CREATININE 1.29 (H) 03/14/2015 0841   CREATININE 1.3 11/22/2014 1320      Component Value Date/Time   CALCIUM 9.5 08/26/2020 1047   CALCIUM 9.1 11/22/2014 1320   ALKPHOS 73 08/26/2020 1047   ALKPHOS 67 11/22/2014 1320   AST 15 08/26/2020 1047   AST 13 11/22/2014 1320   ALT 16 08/26/2020 1047   ALT 14 11/22/2014 1320   BILITOT 0.5 08/26/2020 1047   BILITOT 0.64 11/22/2014 1320

## 2020-11-25 NOTE — Assessment & Plan Note (Signed)
This is mild, could be due to CLL He is not symptomatic Observed only

## 2021-01-26 HISTORY — PX: COLONOSCOPY: SHX174

## 2021-02-03 DIAGNOSIS — K648 Other hemorrhoids: Secondary | ICD-10-CM | POA: Diagnosis not present

## 2021-02-03 DIAGNOSIS — D125 Benign neoplasm of sigmoid colon: Secondary | ICD-10-CM | POA: Diagnosis not present

## 2021-02-03 DIAGNOSIS — Z1211 Encounter for screening for malignant neoplasm of colon: Secondary | ICD-10-CM | POA: Diagnosis not present

## 2021-02-03 DIAGNOSIS — D120 Benign neoplasm of cecum: Secondary | ICD-10-CM | POA: Diagnosis not present

## 2021-02-03 LAB — HM COLONOSCOPY

## 2021-02-05 DIAGNOSIS — D125 Benign neoplasm of sigmoid colon: Secondary | ICD-10-CM | POA: Diagnosis not present

## 2021-02-05 DIAGNOSIS — D120 Benign neoplasm of cecum: Secondary | ICD-10-CM | POA: Diagnosis not present

## 2021-02-10 ENCOUNTER — Ambulatory Visit (INDEPENDENT_AMBULATORY_CARE_PROVIDER_SITE_OTHER): Payer: Medicare HMO | Admitting: Medical

## 2021-02-10 ENCOUNTER — Other Ambulatory Visit: Payer: Self-pay

## 2021-02-10 VITALS — BP 120/80 | HR 75 | Wt 148.4 lb

## 2021-02-10 DIAGNOSIS — L608 Other nail disorders: Secondary | ICD-10-CM | POA: Diagnosis not present

## 2021-02-10 NOTE — Progress Notes (Addendum)
Subjective:  Tyler Wu is a 76 y.o. male who presents for Chief Complaint  Patient presents with   Nail Problem    Toenail fungus on left big toe. Not getting better but not getting worse. Been going on a couple months     Here for concern for possible toenail fungus.  He has had discolored toenail for a few months.  No nail loosening.  No nail breakage.  Only involving the left great toenail.  He started using his wife's ciclopirox a few weeks ago, topical lacquer.  He wanted to see if there was other treatment options.  Otherwise normal state of health.  No other aggravating or relieving factors.    No other c/o.  Past Medical History:  Diagnosis Date   Allergy    Aortic atherosclerosis ([place]) 10/2019   per CT chest   BPH (benign prostatic hyperplasia)    CLL (chronic lymphocytic leukemia) ([place]) 2005   followed by oncology yearly.   History of COVID-19 2021   Current Outpatient Medications on File Prior to Visit  Medication Sig Dispense Refill   aspirin 81 MG chewable tablet Chew 81 mg by mouth.     loratadine (CLARITIN) 10 MG tablet      tamsulosin (FLOMAX) 0.4 MG CAPS capsule TAKE 1 CAPSULE(0.4 MG) BY MOUTH DAILY AFTER AND SUPPER 90 capsule 3   No current facility-administered medications on file prior to visit.   Family History  Problem Relation Age of Onset   Cancer Father 60       melonoma   Heart attack Maternal Grandmother    Hypertension Maternal Grandmother    Heart attack Maternal Grandfather    Hypertension Maternal Grandfather    Kidney disease Brother     The following portions of the patient's history were reviewed and updated as appropriate: allergies, current medications, past family history, past medical history, past social history, past surgical history and problem list.  ROS Otherwise as in subjective above  Objective: BP 120/80    Pulse 75    Wt 148 lb 6.4 oz (67.3 kg)    BMI 23.24 kg/m   General appearance: alert, no distress, well  developed, well nourished Left great toenail medial third of the nail with somewhat yellowish overall color but there is some darker underlying brownish/black coloration that goes from the nailbed to about the distal sixth of the nail.  Right great toenail has faint yellowish nests of the distal nail otherwise rest of the nails look normal Feet neurovascularly intact   Assessment: Encounter Diagnosis  Name Primary?   Acquired deformity of toenail Yes     Plan: We discussed his exam findings and symptoms.  Most likely this represents onychomycosis/toenail fungus.  I cannot completely rule out other etiologies.  Given the darker nature of this I wanted some advice.  I sent an electronic E consult for specialty provider review through [facility] on their behalf regarding concern today.  I will get back in touch with patient pending recommendations from the [facility] consult.  I will contact him after I consult with dermatology specialist to [facility]   Addendum: I consulted with dermatologist through [facility].  They felt like the nail findings favored fungus and not anything worrisome.  We will begin trial of Lamisil.  Recheck in 4 weeks   [name] was seen today for nail problem.  Diagnoses and all orders for this visit:  Acquired deformity of toenail    Follow up: pending call back

## 2021-02-11 ENCOUNTER — Telehealth: Payer: Self-pay | Admitting: Medical

## 2021-02-11 MED ORDER — TERBINAFINE HCL 250 MG PO TABS: 250.0000 mg | ORAL_TABLET | Freq: Every day | ORAL | 0 refills | Status: DC

## 2021-02-11 NOTE — Addendum Note (Signed)
Addended by: Carlena Hurl on: 02/11/2021 02:33 PM   Modules accepted: Orders

## 2021-02-11 NOTE — Telephone Encounter (Signed)
I consulted with the dermatologist.  They favored fungus as well and less likely something worrisome.  They recommended he begin Lamisil tablet daily for 6 weeks.  I sent this to the pharmacy.  Let us recheck in 1 month so I can take a look at the nail

## 2021-02-11 NOTE — Telephone Encounter (Signed)
Pt was notified and has schedule appt for next month

## 2021-03-05 DIAGNOSIS — L538 Other specified erythematous conditions: Secondary | ICD-10-CM | POA: Diagnosis not present

## 2021-03-05 DIAGNOSIS — B351 Tinea unguium: Secondary | ICD-10-CM | POA: Diagnosis not present

## 2021-03-05 DIAGNOSIS — L821 Other seborrheic keratosis: Secondary | ICD-10-CM | POA: Diagnosis not present

## 2021-03-05 DIAGNOSIS — D1801 Hemangioma of skin and subcutaneous tissue: Secondary | ICD-10-CM | POA: Diagnosis not present

## 2021-03-05 DIAGNOSIS — L820 Inflamed seborrheic keratosis: Secondary | ICD-10-CM | POA: Diagnosis not present

## 2021-03-05 DIAGNOSIS — S90112A Contusion of left great toe without damage to nail, initial encounter: Secondary | ICD-10-CM | POA: Diagnosis not present

## 2021-03-05 DIAGNOSIS — Z789 Other specified health status: Secondary | ICD-10-CM | POA: Diagnosis not present

## 2021-03-05 DIAGNOSIS — L814 Other melanin hyperpigmentation: Secondary | ICD-10-CM | POA: Diagnosis not present

## 2021-03-06 ENCOUNTER — Encounter: Payer: Self-pay | Admitting: Internal Medicine

## 2021-03-06 ENCOUNTER — Encounter: Payer: Self-pay | Admitting: Medical

## 2021-03-13 ENCOUNTER — Encounter: Payer: Self-pay | Admitting: Internal Medicine

## 2021-03-14 ENCOUNTER — Ambulatory Visit (INDEPENDENT_AMBULATORY_CARE_PROVIDER_SITE_OTHER): Payer: Medicare HMO

## 2021-03-14 VITALS — Ht 67.0 in | Wt 151.0 lb

## 2021-03-14 DIAGNOSIS — Z0000 Encounter for general adult medical examination without abnormal findings: Secondary | ICD-10-CM

## 2021-03-14 NOTE — Progress Notes (Signed)
I connected with Tyler Wu today by telephone and verified that I am speaking with the correct person using two identifiers. Location patient: home Location provider: work Persons participating in the virtual visit: [name], [name] LPN.   I discussed the limitations, risks, security and privacy concerns of performing an evaluation and management service by telephone and the availability of in person appointments. I also discussed with the patient that there may be a patient responsible charge related to this service. The patient expressed understanding and verbally consented to this telephonic visit.    Interactive audio and video telecommunications were attempted between this provider and patient, however failed, due to patient having technical difficulties OR patient did not have access to video capability.  We continued and completed visit with audio only.     Vital signs may be patient reported or missing.  Subjective:   Tyler Wu is a 76 y.o. male who presents for Medicare Annual/Subsequent preventive examination.  Review of Systems     Cardiac Risk Factors include: advanced age (>47men, >10 women);male gender     Objective:    Today's Vitals   03/14/21 0950  Weight: 151 lb (68.5 kg)  Height: 5\' 7"  (1.702 m)   Body mass index is 23.65 kg/m.  Advanced Directives 03/14/2021 07/06/2018 03/30/2017 11/21/2015 11/22/2014  Does Patient Have a Medical Advance Directive? Yes No;Yes Yes No No  Type of Paramedic of [place];Living will Living will;Healthcare Power of Attorney - - -  Does patient want to make changes to medical advance directive? - No - Patient declined Yes (MAU/Ambulatory/Procedural Areas - Information given) - -  Copy of [place] in Chart? No - copy requested No - copy requested - - -  Would patient like information on creating a medical advance directive? - No - Patient declined - No - patient declined  information No - patient declined information    Current Medications (verified) Outpatient Encounter Medications as of 03/14/2021  Medication Sig   aspirin 81 MG chewable tablet Chew 81 mg by mouth.   loratadine (CLARITIN) 10 MG tablet    tamsulosin (FLOMAX) 0.4 MG CAPS capsule TAKE 1 CAPSULE(0.4 MG) BY MOUTH DAILY AFTER AND SUPPER   terbinafine (LAMISIL) 250 MG tablet Take 1 tablet (250 mg total) by mouth daily.   No facility-administered encounter medications on file as of 03/14/2021.    Allergies (verified) Levofloxacin   History: Past Medical History:  Diagnosis Date   Allergy    Aortic atherosclerosis ([place]) 10/2019   per CT chest   BPH (benign prostatic hyperplasia)    CLL (chronic lymphocytic leukemia) ([place]) 2005   followed by oncology yearly.   History of COVID-19 2021   Past Surgical History:  Procedure Laterality Date   COLONOSCOPY     around 2012   [place]   Family History  Problem Relation Age of Onset   Cancer Father 44       melonoma   Heart attack Maternal Grandmother    Hypertension Maternal Grandmother    Heart attack Maternal Grandfather    Hypertension Maternal Grandfather    Kidney disease Brother    Social History   Socioeconomic History   Marital status: Married    Spouse name: Not on file   Number of children: 2   Years of education: Not on file   Highest education level: Associate degree: academic program  Occupational History   Occupation: Geologist, engineering  Tobacco Use  Smoking status: Former    Types: Cigarettes    Quit date: 01/26/1974    Years since quitting: 47.1    Passive exposure: Past   Smokeless tobacco: Never  Vaping Use   Vaping Use: Never used  Substance and Sexual Activity   Alcohol use: No   Drug use: No   Sexual activity: Yes    Birth control/protection: None    Comment: number of sex partners in the last 62 months  1  Other Topics Concern   Not on file  Social History Narrative   Lives with wife.    Active on job at [employer] center.  Hobbies - guitar, golf, spending time with grandkids, movies, reading.   Goes to church sometimes, Probation officer.  04/2020   Social Determinants of Health   Financial Resource Strain: Low Risk    Difficulty of Paying Living Expenses: Not hard at all  Food Insecurity: No Food Insecurity   Worried About Charity fundraiser in the Last Year: Never true   Ran Out of Food in the Last Year: Never true  Transportation Needs: No Transportation Needs   Lack of Transportation (Medical): No   Lack of Transportation (Non-Medical): No  Physical Activity: Sufficiently Active   Days of Exercise per Week: 4 days   Minutes of Exercise per Session: 150+ min  Stress: No Stress Concern Present   Feeling of Stress : Not at all  Social Connections: Not on file    Tobacco Counseling Counseling given: Not Answered   Clinical Intake:  Pre-visit preparation completed: Yes  Pain : No/denies pain     Nutritional Status: BMI of 19-24  Normal Nutritional Risks: None Diabetes: No  How often do you need to have someone help you when you read instructions, pamphlets, or other written materials from your doctor or pharmacy?: 1 - Never What is the last grade level you completed in school?: 14 yrs  Diabetic? no  Interpreter Needed?: No  Information entered by :: NAllen LPN   Activities of Daily Living In your present state of health, do you have any difficulty performing the following activities: 03/14/2021 03/10/2021  Hearing? N N  Vision? N N  Difficulty concentrating or making decisions? N N  Walking or climbing stairs? N N  Dressing or bathing? N N  Doing errands, shopping? N N  Preparing Food and eating ? N N  Using the Toilet? N N  In the past six months, have you accidently leaked urine? N N  Do you have problems with loss of bowel control? N N  Managing your Medications? N N  Managing your Finances? N N  Housekeeping or managing your Housekeeping? N N   Some recent data might be hidden    Patient Care Team: Tysinger, Camelia Eng, PA-C as PCP - General (Family Medicine) Heath Lark, MD as Consulting Physician (Hematology and Oncology)  Indicate any recent Medical Services you may have received from other than [facility] providers in the past year (date may be approximate).     Assessment:   This is a routine wellness examination for [place].  Hearing/Vision screen Vision Screening - Comments:: Regular eye exams, [facility]  Dietary issues and exercise activities discussed: Current Exercise Habits: The patient has a physically strenuous job, but has no regular exercise apart from work.   Goals Addressed             This Visit's Progress    Patient Stated  03/14/2021, maintain current health and stay active       Depression Screen PHQ 2/9 Scores 03/14/2021 02/10/2021 05/02/2020 08/02/2018 07/06/2018 04/14/2018 04/07/2018  PHQ - 2 Score 0 0 0 0 0 0 0    Fall Risk Fall Risk  03/14/2021 03/10/2021 02/10/2021 05/02/2020 08/02/2018  Falls in the past year? 0 0 0 1 0  Number falls in past yr: - - 0 0 0  Injury with Fall? - - 0 1 0  Risk for fall due to : No Fall Risks - No Fall Risks No Fall Risks -  Follow up Education provided;Falls prevention discussed - Falls evaluation completed Falls evaluation completed Falls evaluation completed    FALL RISK PREVENTION PERTAINING TO THE HOME:  Any stairs in or around the home? Yes  If so, are there any without handrails? No  Home free of loose throw rugs in walkways, pet beds, electrical cords, etc? Yes  Adequate lighting in your home to reduce risk of falls? Yes   ASSISTIVE DEVICES UTILIZED TO PREVENT FALLS:  Life alert? No  Use of a cane, walker or w/c? No  Grab bars in the bathroom? Yes  Shower chair or bench in shower? Yes  Elevated toilet seat or a handicapped toilet? No   TIMED UP AND GO:  Was the test performed? No .      Cognitive Function:     6CIT Screen 03/14/2021  07/06/2018 03/30/2017  What Year? 0 points 0 points 0 points  What month? 0 points 0 points 0 points  What time? 0 points 0 points 0 points  Count back from 20 0 points 0 points 0 points  Months in reverse 0 points 0 points 0 points  Repeat phrase 0 points 0 points 0 points  Total Score 0 0 0    Immunizations Immunization History  Administered Date(s) Administered   Influenza Split 11/11/2013, 10/27/2014, 10/31/2015, 10/26/2016, 11/10/2017   Influenza, High Dose Seasonal PF 10/23/2018, 10/26/2020   Influenza-Unspecified 11/11/2013, 10/31/2015, 10/26/2016   Moderna Sars-Covid-2 Vaccination 03/13/2019, 04/11/2019   Pneumococcal Conjugate-13 03/30/2017    TDAP status: Due, Education has been provided regarding the importance of this vaccine. Advised may receive this vaccine at local pharmacy or Health Dept. Aware to provide a copy of the vaccination record if obtained from local pharmacy or Health Dept. Verbalized acceptance and understanding.  Flu Vaccine status: Up to date  Pneumococcal vaccine status: Due, Education has been provided regarding the importance of this vaccine. Advised may receive this vaccine at local pharmacy or Health Dept. Aware to provide a copy of the vaccination record if obtained from local pharmacy or Health Dept. Verbalized acceptance and understanding.  Covid-19 vaccine status: Completed vaccines  Qualifies for Shingles Vaccine? Yes   Zostavax completed No   Shingrix Completed?: No.    Education has been provided regarding the importance of this vaccine. Patient has been advised to call insurance company to determine out of pocket expense if they have not yet received this vaccine. Advised may also receive vaccine at local pharmacy or Health Dept. Verbalized acceptance and understanding.  Screening Tests Health Maintenance  Topic Date Due   Zoster Vaccines- Shingrix (1 of 2) Never done   Pneumonia Vaccine 66+ Years old (2 - PPSV23 if available, else PCV20)  03/31/2018   COVID-19 Vaccine (3 - Moderna risk series) 05/09/2019   TETANUS/TDAP  06/08/2019   COLONOSCOPY (Pts 45-7yrs Insurance coverage will need to be confirmed)  02/04/2031   INFLUENZA VACCINE  Completed  Hepatitis C Screening  Completed   HPV VACCINES  Aged Out    Health Maintenance  Health Maintenance Due  Topic Date Due   Zoster Vaccines- Shingrix (1 of 2) Never done   Pneumonia Vaccine 1+ Years old (2 - PPSV23 if available, else PCV20) 03/31/2018   COVID-19 Vaccine (3 - Moderna risk series) 05/09/2019   TETANUS/TDAP  06/08/2019    Colorectal cancer screening: Type of screening: Colonoscopy. Completed 02/03/2021. Repeat every 3 years  Lung Cancer Screening: (Low Dose CT Chest recommended if Age 21-80 years, 30 pack-year currently smoking OR have quit w/in 15years.) does not qualify.   Lung Cancer Screening Referral: no  Additional Screening:  Hepatitis C Screening: does qualify; Completed 04/07/2018  Vision Screening: Recommended annual ophthalmology exams for early detection of glaucoma and other disorders of the eye. Is the patient up to date with their annual eye exam?  Yes  Who is the provider or what is the name of the office in which the patient attends annual eye exams? [facility] If pt is not established with a provider, would they like to be referred to a provider to establish care? No .   Dental Screening: Recommended annual dental exams for proper oral hygiene  Community Resource Referral / Chronic Care Management: CRR required this visit?  No   CCM required this visit?  No      Plan:     I have personally reviewed and noted the following in the patients chart:   Medical and social history Use of alcohol, tobacco or illicit drugs  Current medications and supplements including opioid prescriptions. Patient is not currently taking opioid prescriptions. Functional ability and status Nutritional status Physical activity Advanced  directives List of other physicians Hospitalizations, surgeries, and ER visits in previous 12 months Vitals Screenings to include cognitive, depression, and falls Referrals and appointments  In addition, I have reviewed and discussed with patient certain preventive protocols, quality metrics, and best practice recommendations. A written personalized care plan for preventive services as well as general preventive health recommendations were provided to patient.     Kellie Simmering, LPN   [id number]   Nurse Notes: Patient had Prevnar 13 in 2019. Question if due for another pneumonia vaccine as suggested in Epic.  Due to this being a virtual visit, the after visit summary with patients personalized plan was offered to patient via mail or my-chart. Patient would like to access on my-chart

## 2021-03-14 NOTE — Patient Instructions (Signed)
Tyler Wu , Thank you for taking time to come for your Medicare Wellness Visit. I appreciate your ongoing commitment to your health goals. Please review the following plan we discussed and let me know if I can assist you in the future.   Screening recommendations/referrals: Colonoscopy: completed 02/03/2021, due 02/04/2024 Recommended yearly ophthalmology/optometry visit for glaucoma screening and checkup Recommended yearly dental visit for hygiene and checkup  Vaccinations: Influenza vaccine: completed 10/26/2020, due next flu season Pneumococcal vaccine: completed 03/30/2017, question if needs PCV20 Tdap vaccine: due now Shingles vaccine: states getting first dose today   Covid-19:  04/11/2019, 03/13/2019 (getting booster today)  Advanced directives: Please bring a copy of your POA (Power of Attorney) and/or Living Will to your next appointment.   Conditions/risks identified: none  Next appointment: Follow up in one year for your annual wellness visit.   Preventive Care 76 Years and Older, Male Preventive care refers to lifestyle choices and visits with your health care provider that can promote health and wellness. What does preventive care include? A yearly physical exam. This is also called an annual well check. Dental exams once or twice a year. Routine eye exams. Ask your health care provider how often you should have your eyes checked. Personal lifestyle choices, including: Daily care of your teeth and gums. Regular physical activity. Eating a healthy diet. Avoiding tobacco and drug use. Limiting alcohol use. Practicing safe sex. Taking low doses of aspirin every day. Taking vitamin and mineral supplements as recommended by your health care provider. What happens during an annual well check? The services and screenings done by your health care provider during your annual well check will depend on your age, overall health, lifestyle risk factors, and family history of  disease. Counseling  Your health care provider may ask you questions about your: Alcohol use. Tobacco use. Drug use. Emotional well-being. Home and relationship well-being. Sexual activity. Eating habits. History of falls. Memory and ability to understand (cognition). Work and work Statistician. Screening  You may have the following tests or measurements: Height, weight, and BMI. Blood pressure. Lipid and cholesterol levels. These may be checked every 5 years, or more frequently if you are over 37 years old. Skin check. Lung cancer screening. You may have this screening every year starting at age 91 if you have a 30-pack-year history of smoking and currently smoke or have quit within the past 15 years. Fecal occult blood test (FOBT) of the stool. You may have this test every year starting at age 67. Flexible sigmoidoscopy or colonoscopy. You may have a sigmoidoscopy every 5 years or a colonoscopy every 10 years starting at age 19. Prostate cancer screening. Recommendations will vary depending on your family history and other risks. Hepatitis C blood test. Hepatitis B blood test. Sexually transmitted disease (STD) testing. Diabetes screening. This is done by checking your blood sugar (glucose) after you have not eaten for a while (fasting). You may have this done every 1-3 years. Abdominal aortic aneurysm (AAA) screening. You may need this if you are a current or former smoker. Osteoporosis. You may be screened starting at age 72 if you are at high risk. Talk with your health care provider about your test results, treatment options, and if necessary, the need for more tests. Vaccines  Your health care provider may recommend certain vaccines, such as: Influenza vaccine. This is recommended every year. Tetanus, diphtheria, and acellular pertussis (Tdap, Td) vaccine. You may need a Td booster every 10 years. Zoster vaccine. You may  need this after age 60. Pneumococcal 13-valent  conjugate (PCV13) vaccine. One dose is recommended after age 60. Pneumococcal polysaccharide (PPSV23) vaccine. One dose is recommended after age 40. Talk to your health care provider about which screenings and vaccines you need and how often you need them. This information is not intended to replace advice given to you by your health care provider. Make sure you discuss any questions you have with your health care provider. Document Released: 02/08/2015 Document Revised: 10/02/2015 Document Reviewed: 11/13/2014 Elsevier Interactive Patient Education  2017 [place] Prevention in the Home Falls can cause injuries. They can happen to people of all ages. There are many things you can do to make your home safe and to help prevent falls. What can I do on the outside of my home? Regularly fix the edges of walkways and driveways and fix any cracks. Remove anything that might make you trip as you walk through a door, such as a raised step or threshold. Trim any bushes or trees on the path to your home. Use bright outdoor lighting. Clear any walking paths of anything that might make someone trip, such as rocks or tools. Regularly check to see if handrails are loose or broken. Make sure that both sides of any steps have handrails. Any raised decks and porches should have guardrails on the edges. Have any leaves, snow, or ice cleared regularly. Use sand or salt on walking paths during winter. Clean up any spills in your garage right away. This includes oil or grease spills. What can I do in the bathroom? Use night lights. Install grab bars by the toilet and in the tub and shower. Do not use towel bars as grab bars. Use non-skid mats or decals in the tub or shower. If you need to sit down in the shower, use a plastic, non-slip stool. Keep the floor dry. Clean up any water that spills on the floor as soon as it happens. Remove soap buildup in the tub or shower regularly. Attach bath mats  securely with double-sided non-slip rug tape. Do not have throw rugs and other things on the floor that can make you trip. What can I do in the bedroom? Use night lights. Make sure that you have a light by your bed that is easy to reach. Do not use any sheets or blankets that are too big for your bed. They should not hang down onto the floor. Have a firm chair that has side arms. You can use this for support while you get dressed. Do not have throw rugs and other things on the floor that can make you trip. What can I do in the kitchen? Clean up any spills right away. Avoid walking on wet floors. Keep items that you use a lot in easy-to-reach places. If you need to reach something above you, use a strong step stool that has a grab bar. Keep electrical cords out of the way. Do not use floor polish or wax that makes floors slippery. If you must use wax, use non-skid floor wax. Do not have throw rugs and other things on the floor that can make you trip. What can I do with my stairs? Do not leave any items on the stairs. Make sure that there are handrails on both sides of the stairs and use them. Fix handrails that are broken or loose. Make sure that handrails are as long as the stairways. Check any carpeting to make sure that it is firmly attached  to the stairs. Fix any carpet that is loose or worn. Avoid having throw rugs at the top or bottom of the stairs. If you do have throw rugs, attach them to the floor with carpet tape. Make sure that you have a light switch at the top of the stairs and the bottom of the stairs. If you do not have them, ask someone to add them for you. What else can I do to help prevent falls? Wear shoes that: Do not have high heels. Have rubber bottoms. Are comfortable and fit you well. Are closed at the toe. Do not wear sandals. If you use a stepladder: Make sure that it is fully opened. Do not climb a closed stepladder. Make sure that both sides of the stepladder  are locked into place. Ask someone to hold it for you, if possible. Clearly mark and make sure that you can see: Any grab bars or handrails. First and last steps. Where the edge of each step is. Use tools that help you move around (mobility aids) if they are needed. These include: Canes. Walkers. Scooters. Crutches. Turn on the lights when you go into a dark area. Replace any light bulbs as soon as they burn out. Set up your furniture so you have a clear path. Avoid moving your furniture around. If any of your floors are uneven, fix them. If there are any pets around you, be aware of where they are. Review your medicines with your doctor. Some medicines can make you feel dizzy. This can increase your chance of falling. Ask your doctor what other things that you can do to help prevent falls. This information is not intended to replace advice given to you by your health care provider. Make sure you discuss any questions you have with your health care provider. Document Released: 11/08/2008 Document Revised: 06/20/2015 Document Reviewed: 02/16/2014 Elsevier Interactive Patient Education  2017 [employer].

## 2021-03-17 ENCOUNTER — Ambulatory Visit: Payer: Medicare HMO | Admitting: Medical

## 2021-03-24 ENCOUNTER — Ambulatory Visit
Admission: EM | Admit: 2021-03-24 | Discharge: 2021-03-24 | Disposition: A | Discharge status: Home / Self Care | Payer: Medicare HMO | Attending: Family Medicine | Admitting: Family Medicine

## 2021-03-24 ENCOUNTER — Encounter: Payer: Self-pay | Admitting: Emergency Medicine

## 2021-03-24 ENCOUNTER — Other Ambulatory Visit: Payer: Self-pay

## 2021-03-24 DIAGNOSIS — R0981 Nasal congestion: Secondary | ICD-10-CM

## 2021-03-24 MED ORDER — AMOXICILLIN 875 MG PO TABS
875.0000 mg | ORAL_TABLET | Freq: Two times a day (BID) | ORAL | 0 refills | Status: AC
Start: 2021-03-24 — End: 2021-04-03

## 2021-03-24 MED ORDER — AMOXICILLIN 875 MG PO TABS: 875.0000 mg | ORAL_TABLET | Freq: Two times a day (BID) | ORAL | 0 refills | Status: DC

## 2021-03-24 NOTE — ED Triage Notes (Signed)
Green nasal congestion since last Wednesday with associated headache and ear aches

## 2021-03-26 NOTE — ED Provider Notes (Signed)
[facility]   [id number] 03/24/21 Arrival Time: 4098  ASSESSMENT & PLAN:  1. Nasal sinus congestion    Could be developing sinus infection. Paper Rx given for below. Will begin if not improving over next 72 hours.  Meds ordered this encounter  Medications   amoxicillin (AMOXIL) 875 MG tablet    Sig: Take 1 tablet (875 mg total) by mouth 2 (two) times daily for 10 days.    Dispense:  20 tablet    Refill:  0    Discussed typical duration of symptoms. OTC symptom care as needed. Ensure adequate fluid intake and rest.   Follow-up Information     Tysinger, Camelia Eng, PA-C.   Specialty: Family Medicine Why: As needed. Contact information: [address] [place] [address] [phone]                 Reviewed expectations re: course of current medical issues. Questions answered. Outlined signs and symptoms indicating need for more acute intervention. Patient verbalized understanding. After Visit Summary given.   SUBJECTIVE: History from: patient. Tyler Wu is a 76 y.o. male who presents with complaint of nasal congestion, post-nasal drainage, and sinus pain. Onset gradual,  about one week ago . Respiratory symptoms: none. Fever: absent. Overall normal PO intake without n/v. OTC treatment: OTC decongestant. Seasonal allergies: no. History of frequent sinus infections: no. No specific aggravating or alleviating factors reported. Social History   Tobacco Use  Smoking Status Former   Types: Cigarettes   Quit date: 01/26/1974   Years since quitting: 47.1   Passive exposure: Past  Smokeless Tobacco Never    ROS: As per HPI.  OBJECTIVE:  Vitals:   03/24/21 1452  BP: (!) 148/69  Pulse: 70  Resp: 16  Temp: 98.4 F (36.9 C)  TempSrc: Oral  SpO2: 97%     General appearance: alert; no distress HEENT: nasal congestion; clear runny nose; throat irritation secondary to post-nasal drainage; bilateral maxillary tenderness to palpation;  turbinates boggy Neck: supple without LAD; trachea midline Lungs: unlabored respirations, symmetrical air entry; cough: absent; no respiratory distress Skin: warm and dry Psychological: alert and cooperative; normal mood and affect  Allergies  Allergen Reactions   Levofloxacin Other (See Comments)    Bad dreams     Past Medical History:  Diagnosis Date   Allergy    Aortic atherosclerosis ([place]) 10/2019   per CT chest   BPH (benign prostatic hyperplasia)    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   History of COVID-19 2021   Family History  Problem Relation Age of Onset   Cancer Father 19       melonoma   Heart attack Maternal Grandmother    Hypertension Maternal Grandmother    Heart attack Maternal Grandfather    Hypertension Maternal Grandfather    Kidney disease Brother    Social History   Socioeconomic History   Marital status: Married    Spouse name: Not on file   Number of children: 2   Years of education: Not on file   Highest education level: Associate degree: academic program  Occupational History   Occupation: purchasing  Tobacco Use   Smoking status: Former    Types: Cigarettes    Quit date: 01/26/1974    Years since quitting: 47.1    Passive exposure: Past   Smokeless tobacco: Never  Vaping Use   Vaping Use: Never used  Substance and Sexual Activity   Alcohol use:  No   Drug use: No   Sexual activity: Yes    Birth control/protection: None    Comment: number of sex partners in the last 35 months  1  Other Topics Concern   Not on file  Social History Narrative   Lives with wife.   Active on job at [employer] center.  Hobbies - guitar, golf, spending time with grandkids, movies, reading.   Goes to church sometimes, Probation officer.  04/2020   Social Determinants of Health   Financial Resource Strain: Low Risk    Difficulty of Paying Living Expenses: Not hard at all  Food Insecurity: No Food Insecurity   Worried  About Charity fundraiser in the Last Year: Never true   Ran Out of Food in the Last Year: Never true  Transportation Needs: No Transportation Needs   Lack of Transportation (Medical): No   Lack of Transportation (Non-Medical): No  Physical Activity: Sufficiently Active   Days of Exercise per Week: 4 days   Minutes of Exercise per Session: 150+ min  Stress: No Stress Concern Present   Feeling of Stress : Not at all  Social Connections: Not on file  Intimate Partner Violence: Not on file             Vanessa Kick, MD 03/26/21 [phone]

## 2021-06-30 DIAGNOSIS — H2513 Age-related nuclear cataract, bilateral: Secondary | ICD-10-CM | POA: Diagnosis not present

## 2021-06-30 DIAGNOSIS — H5213 Myopia, bilateral: Secondary | ICD-10-CM | POA: Diagnosis not present

## 2021-06-30 DIAGNOSIS — H524 Presbyopia: Secondary | ICD-10-CM | POA: Diagnosis not present

## 2021-06-30 DIAGNOSIS — H52223 Regular astigmatism, bilateral: Secondary | ICD-10-CM | POA: Diagnosis not present

## 2021-08-16 ENCOUNTER — Other Ambulatory Visit: Payer: Self-pay | Admitting: Medical

## 2021-08-19 ENCOUNTER — Other Ambulatory Visit: Payer: Self-pay

## 2021-08-19 NOTE — Telephone Encounter (Signed)
Is this ok to refill?  

## 2021-08-25 ENCOUNTER — Other Ambulatory Visit: Payer: Self-pay | Admitting: Medical

## 2021-10-01 ENCOUNTER — Encounter: Payer: Self-pay | Admitting: Internal Medicine

## 2021-10-06 ENCOUNTER — Other Ambulatory Visit (INDEPENDENT_AMBULATORY_CARE_PROVIDER_SITE_OTHER): Payer: Medicare HMO

## 2021-10-06 DIAGNOSIS — Z23 Encounter for immunization: Secondary | ICD-10-CM

## 2021-11-04 ENCOUNTER — Encounter: Payer: Self-pay | Admitting: Internal Medicine

## 2021-11-25 ENCOUNTER — Encounter: Payer: Self-pay | Admitting: Hematology and Oncology

## 2021-11-25 ENCOUNTER — Other Ambulatory Visit: Payer: Self-pay

## 2021-11-25 ENCOUNTER — Inpatient Hospital Stay: Payer: Medicare HMO | Attending: Hematology and Oncology

## 2021-11-25 ENCOUNTER — Inpatient Hospital Stay (HOSPITAL_BASED_OUTPATIENT_CLINIC_OR_DEPARTMENT_OTHER): Payer: Medicare HMO | Admitting: Hematology and Oncology

## 2021-11-25 VITALS — BP 151/71 | HR 73 | Temp 98.0°F | Resp 18 | Ht 67.0 in | Wt 151.2 lb

## 2021-11-25 DIAGNOSIS — C9110 Chronic lymphocytic leukemia of B-cell type not having achieved remission: Secondary | ICD-10-CM | POA: Insufficient documentation

## 2021-11-25 DIAGNOSIS — Z7982 Long term (current) use of aspirin: Secondary | ICD-10-CM | POA: Diagnosis not present

## 2021-11-25 DIAGNOSIS — Z79899 Other long term (current) drug therapy: Secondary | ICD-10-CM | POA: Diagnosis not present

## 2021-11-25 LAB — CBC WITH DIFFERENTIAL/PLATELET
Abs Immature Granulocytes: 0.07 10*3/uL (ref 0.00–0.07)
Basophils Absolute: 0.1 10*3/uL (ref 0.0–0.1)
Basophils Relative: 0 %
Eosinophils Absolute: 0.2 10*3/uL (ref 0.0–0.5)
Eosinophils Relative: 1 %
HCT: 43.6 % (ref 39.0–52.0)
Hemoglobin: 14.4 g/dL (ref 13.0–17.0)
Immature Granulocytes: 0 %
Lymphocytes Relative: 78 %
Lymphs Abs: 20.2 10*3/uL — ABNORMAL HIGH (ref 0.7–4.0)
MCH: 29.7 pg (ref 26.0–34.0)
MCHC: 33 g/dL (ref 30.0–36.0)
MCV: 89.9 fL (ref 80.0–100.0)
Monocytes Absolute: 0.6 10*3/uL (ref 0.1–1.0)
Monocytes Relative: 2 %
Neutro Abs: 5.1 10*3/uL (ref 1.7–7.7)
Neutrophils Relative %: 19 %
Platelets: 181 10*3/uL (ref 150–400)
RBC: 4.85 MIL/uL (ref 4.22–5.81)
RDW: 13.4 % (ref 11.5–15.5)
Smear Review: NORMAL
WBC: 26.3 10*3/uL — ABNORMAL HIGH (ref 4.0–10.5)
nRBC: 0 % (ref 0.0–0.2)

## 2021-11-25 NOTE — Progress Notes (Signed)
[facility] OFFICE PROGRESS NOTE  Patient Care Team: Tysinger, Camelia Eng, PA-C as PCP - General (Family Medicine) Heath Lark, MD as Consulting Physician (Hematology and Oncology)  ASSESSMENT & PLAN:  CLL (chronic lymphocytic leukemia) ([place]) The palpable lymphadenopathy in the submandibular region is small and stable in size There is no appreciable progression of CLL I recommend close monitoring for now I will see him again in a year for further follow-up I reviewed his vaccination schedule and he appears to be up-to-date with all his vaccination  No orders of the defined types were placed in this encounter.   All questions were answered. The patient knows to call the clinic with any problems, questions or concerns. The total time spent in the appointment was 20 minutes encounter with patients including review of chart and various tests results, discussions about plan of care and coordination of care plan   Heath Lark, MD 11/25/2021 12:53 PM  INTERVAL HISTORY: Please see below for problem oriented charting. he returns for surveillance follow-up for CLL on observation He denies worsening lymphadenopathy on the side of his neck No recent infection no new other lymphadenopathy  REVIEW OF SYSTEMS:   Constitutional: Denies fevers, chills or abnormal weight loss Eyes: Denies blurriness of vision Ears, nose, mouth, throat, and face: Denies mucositis or sore throat Respiratory: Denies cough, dyspnea or wheezes Cardiovascular: Denies palpitation, chest discomfort or lower extremity swelling Gastrointestinal:  Denies nausea, heartburn or change in bowel habits Skin: Denies abnormal skin rashes Lymphatics: Denies new lymphadenopathy or easy bruising Neurological:Denies numbness, tingling or new weaknesses Behavioral/Psych: Mood is stable, no new changes  All other systems were reviewed with the patient and are negative.  I have reviewed the past medical history, past  surgical history, social history and family history with the patient and they are unchanged from previous note.  ALLERGIES:  is allergic to levofloxacin.  MEDICATIONS:  Current Outpatient Medications  Medication Sig Dispense Refill   aspirin 81 MG chewable tablet Chew 81 mg by mouth.     loratadine (CLARITIN) 10 MG tablet      tamsulosin (FLOMAX) 0.4 MG CAPS capsule TAKE 1 CAPSULE(0.4 MG) BY MOUTH DAILY AFTER SUPPER 90 capsule 0   No current facility-administered medications for this visit.    SUMMARY OF ONCOLOGIC HISTORY: Oncology History  CLL (chronic lymphocytic leukemia) ([place])  04/18/2003 Pathology Results   FLOW CYTOMETRIC ANALYSIS OF THE LYMPHOID POPULATION SHOWS A MONOCLONAL, LAMBDA RESTRICTED (DIM) B-CELL POPULATION EXPRESSING PAN B-CELL ANTIGENS INCLUDING CD20 AND CD23. THIS IS ASSOCIATED WITH COEXPRESSION OF CD5 AND CD20. NO CD10 EXPRESSION IS IDENTIFIED. THE FEATURES ARE COMPATIBLE WITH CHRONIC LYMPHOCYTIC  LEUKEMIA.   09/05/2003 Pathology Results   FLOW CYTOMETRY ANALYSIS OF THE CHRONIC LYMPHOCYTIC LEUKEMIA POPULATION SHOWS THAT APPROXIMATELY 18% OF THE CELLS ARE POSITIVE FOR ZAP-70. AS 20% IS THE CUT OFF FOR POSITIVITY, THIS IS INTERPRETED AS A BORDERLINE/NEGATIVE RESULT   11/23/2018 Cancer Staging   Staging form: Chronic Lymphocytic Leukemia / Small Lymphocytic Lymphoma, AJCC 8th Edition - Clinical stage from 11/23/2018: Modified Rai Stage I (Modified Rai risk: Intermediate, Lymphocytosis: Present, Adenopathy: Present, Organomegaly: Absent, Anemia: Absent, Thrombocytopenia: Absent) - Signed by Heath Lark, MD on 11/23/2018     PHYSICAL EXAMINATION: ECOG PERFORMANCE STATUS: 1 - Symptomatic but completely ambulatory  Vitals:   11/25/21 1227  BP: (!) 151/71  Pulse: 73  Resp: 18  Temp: 98 F (36.7 C)  SpO2: 100%   Filed Weights   11/25/21 1227  Weight: 151 lb 3.2  oz (68.6 kg)    GENERAL:alert, no distress and comfortable SKIN: skin color, texture, turgor are  normal, no rashes or significant lesions EYES: normal, Conjunctiva are pink and non-injected, sclera clear OROPHARYNX:no exudate, no erythema and lips, buccal mucosa, and tongue normal  NECK: supple, thyroid normal size, non-tender, without nodularity LYMPH: Palpable lymph node on the submandibular region is stable in size and not worse LUNGS: clear to auscultation and percussion with normal breathing effort HEART: regular rate & rhythm and no murmurs and no lower extremity edema ABDOMEN:abdomen soft, non-tender and normal bowel sounds Musculoskeletal:no cyanosis of digits and no clubbing  NEURO: alert & oriented x 3 with fluent speech, no focal motor/sensory deficits  LABORATORY DATA:  I have reviewed the data as listed    Component Value Date/Time   NA 142 08/26/2020 1047   NA 140 11/22/2014 1320   K 4.8 08/26/2020 1047   K 3.8 11/22/2014 1320   CL 104 08/26/2020 1047   CL 107 11/20/2011 1011   CO2 25 08/26/2020 1047   CO2 24 11/22/2014 1320   GLUCOSE 88 08/26/2020 1047   GLUCOSE 91 11/23/2017 1233   GLUCOSE 154 (H) 11/22/2014 1320   GLUCOSE 87 11/20/2011 1011   BUN 13 08/26/2020 1047   BUN 10.5 11/22/2014 1320   CREATININE 1.09 08/26/2020 1047   CREATININE 1.29 (H) 03/14/2015 0841   CREATININE 1.3 11/22/2014 1320   CALCIUM 9.5 08/26/2020 1047   CALCIUM 9.1 11/22/2014 1320   PROT 6.1 08/26/2020 1047   PROT 6.4 11/22/2014 1320   ALBUMIN 4.5 08/26/2020 1047   ALBUMIN 4.1 11/22/2014 1320   AST 15 08/26/2020 1047   AST 13 11/22/2014 1320   ALT 16 08/26/2020 1047   ALT 14 11/22/2014 1320   ALKPHOS 73 08/26/2020 1047   ALKPHOS 67 11/22/2014 1320   BILITOT 0.5 08/26/2020 1047   BILITOT 0.64 11/22/2014 1320   GFRNONAA 75 03/08/2020 1042   GFRAA 86 03/08/2020 1042    No results found for: "SPEP", "UPEP"  Lab Results  Component Value Date   WBC 26.3 (H) 11/25/2021   NEUTROABS 5.1 11/25/2021   HGB 14.4 11/25/2021   HCT 43.6 11/25/2021   MCV 89.9 11/25/2021   PLT 181  11/25/2021      Chemistry      Component Value Date/Time   NA 142 08/26/2020 1047   NA 140 11/22/2014 1320   K 4.8 08/26/2020 1047   K 3.8 11/22/2014 1320   CL 104 08/26/2020 1047   CL 107 11/20/2011 1011   CO2 25 08/26/2020 1047   CO2 24 11/22/2014 1320   BUN 13 08/26/2020 1047   BUN 10.5 11/22/2014 1320   CREATININE 1.09 08/26/2020 1047   CREATININE 1.29 (H) 03/14/2015 0841   CREATININE 1.3 11/22/2014 1320      Component Value Date/Time   CALCIUM 9.5 08/26/2020 1047   CALCIUM 9.1 11/22/2014 1320   ALKPHOS 73 08/26/2020 1047   ALKPHOS 67 11/22/2014 1320   AST 15 08/26/2020 1047   AST 13 11/22/2014 1320   ALT 16 08/26/2020 1047   ALT 14 11/22/2014 1320   BILITOT 0.5 08/26/2020 1047   BILITOT 0.64 11/22/2014 1320

## 2021-11-25 NOTE — Assessment & Plan Note (Signed)
The palpable lymphadenopathy in the submandibular region is small and stable in size There is no appreciable progression of CLL I recommend close monitoring for now I will see him again in a year for further follow-up I reviewed his vaccination schedule and he appears to be up-to-date with all his vaccination

## 2021-11-30 ENCOUNTER — Ambulatory Visit
Admission: RE | Admit: 2021-11-30 | Discharge: 2021-11-30 | Disposition: A | Discharge status: Home / Self Care | Payer: Medicare HMO | Source: Ambulatory Visit | Attending: Emergency Medicine | Admitting: Emergency Medicine

## 2021-11-30 VITALS — BP 159/78 | HR 63 | Temp 98.2°F | Resp 16

## 2021-11-30 DIAGNOSIS — J329 Chronic sinusitis, unspecified: Secondary | ICD-10-CM | POA: Diagnosis not present

## 2021-11-30 DIAGNOSIS — H6591 Unspecified nonsuppurative otitis media, right ear: Secondary | ICD-10-CM | POA: Diagnosis not present

## 2021-11-30 DIAGNOSIS — J019 Acute sinusitis, unspecified: Secondary | ICD-10-CM

## 2021-11-30 DIAGNOSIS — J309 Allergic rhinitis, unspecified: Secondary | ICD-10-CM

## 2021-11-30 DIAGNOSIS — B9689 Other specified bacterial agents as the cause of diseases classified elsewhere: Secondary | ICD-10-CM

## 2021-11-30 MED ORDER — FLUTICASONE PROPIONATE 50 MCG/ACT NA SUSP: 1.0000 | Freq: Every day | NASAL | 2 refills | Status: DC

## 2021-11-30 MED ORDER — CETIRIZINE HCL 10 MG PO TABS: 10.0000 mg | ORAL_TABLET | Freq: Every day | ORAL | 1 refills | Status: DC

## 2021-11-30 MED ORDER — AMOXICILLIN-POT CLAVULANATE 875-125 MG PO TABS: 1.0000 | ORAL_TABLET | Freq: Two times a day (BID) | ORAL | 0 refills | Status: AC

## 2021-11-30 NOTE — Discharge Instructions (Signed)
I have enclosed some information about sinus infections and allergic rhinitis that I hope you find helpful.  Please read below to learn more about the medications, dosages and frequencies that I recommend to help alleviate your symptoms and to get you feeling better soon:   Augmentin (amoxicillin - clavulanic acid):  take 1 tablet twice daily for 7 days, you can take it with or without food.  This antibiotic can cause upset stomach, this will resolve once antibiotics are complete.  You are welcome to use a probiotic, eat yogurt, take Imodium while taking this medication.  Please avoid other systemic medications such as Maalox, Pepto-Bismol or milk of magnesia as they can interfere with your body's ability to absorb the antibiotics.      Zyrtec (cetirizine): This is an excellent second-generation antihistamine that helps to reduce respiratory inflammatory response to environmental allergens.  In some patients, this medication can cause daytime sleepiness so I recommend that you take 1 tablet daily at bedtime.     Flonase (fluticasone): This is a steroid nasal spray that you use once daily, 1 spray in each nare.  This medication does not work well if you decide to use it only used as you feel you need to, it works best used on a daily basis.  After 3 to 5 days of use, you will notice significant reduction of the inflammation and mucus production that is currently being caused by exposure to allergens, whether seasonal or environmental.  The most common side effect of this medication is nosebleeds.  If you experience a nosebleed, please discontinue use for 1 week, then feel free to resume.  I have provided you with a prescription.     Please follow-up within the next 5-7 days either with your primary care provider or urgent care if your symptoms do not resolve.  If you do not have a primary care provider, we will assist you in finding one.        Thank you for visiting urgent care today.  We appreciate the  opportunity to participate in your care.

## 2021-11-30 NOTE — ED Provider Notes (Signed)
[facility]    CSN: [id number] Arrival date & time: 11/30/21  1458    HISTORY   Chief Complaint  Patient presents with   Nasal Congestion   HPI Tyler Wu is a pleasant, 76 y.o. male who presents to urgent care today. Patient complains of nasal congestion that is worse at night.  Patient states this been going on for few weeks and has been taking Mucinex consistently without any relief.  Patient denies fever, chills, nausea, vomiting, diarrhea, body aches, sore throat, headache, known sick contacts.  Patient endorses a history of respiratory allergies, not currently taking any allergy medications.  EMR reviewed, patient has a history of having been prescribed Breo, last written in 2021, also previously advised to use Claritin.  Patient states he had COVID-19 in 2021, also has history of CLL.  The history is provided by the patient.   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   History of COVID-19 2021   Patient Active Problem List   Diagnosis Date Noted   Thrombocytopenia ([place]) 11/25/2020   Injury of nail bed of finger 11/25/2020   Vitamin D deficiency 08/26/2020   Encounter for health maintenance examination in adult 05/02/2020   Medicare annual wellness visit, subsequent 05/02/2020   Screening for lipid disorders 05/02/2020   Vaccine counseling 05/02/2020   Vitamin deficiency 05/02/2020   Screen for colon cancer 05/02/2020   Advanced directives, counseling/discussion 05/02/2020   Elevated BP without diagnosis of hypertension 04/15/2020   Frequent urination 03/08/2020   Enlarged prostate 03/08/2020   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   Dysuria 03/08/2020   Urinary urgency 03/08/2020   Fatigue 11/17/2019   Sinus pressure 11/13/2019   History of COVID-19 11/13/2019   Cough  11/13/2019   Loss of smell 11/13/2019   COVID-19 virus infection 11/03/2019   SOB (shortness of breath) 11/03/2019   Aortic atherosclerosis ([place]) 11/03/2019   History of skin cancer 11/24/2016   Lymphadenopathy of head and neck 11/22/2014   Multiple skin nodules 11/22/2013   Lipomatosis 05/14/2011   CLL (chronic lymphocytic leukemia) ([place]) 03/24/2011   Past Surgical History:  Procedure Laterality Date   COLONOSCOPY     around 2012   [place] Medications    Prior to Admission medications   Medication Sig Start Date End Date Taking? Authorizing Provider  amoxicillin-clavulanate (AUGMENTIN) 875-125 MG tablet Take 1 tablet by mouth 2 (two) times daily for 7 days. 11/30/21 12/07/21 Yes Lynden Oxford Scales, PA-C  cetirizine (ZYRTEC ALLERGY) 10 MG tablet Take 1 tablet (10 mg total) by mouth at bedtime. 11/30/21 05/29/22 Yes Lynden Oxford Scales, PA-C  fluticasone (FLONASE) 50 MCG/ACT nasal spray Place 1 spray into both nostrils daily. Begin by using 2 sprays in each nare daily for 3 to 5 days, then decrease to 1 spray in each nare daily. 11/30/21  Yes Lynden Oxford Scales, PA-C  aspirin 81 MG chewable tablet Chew 81 mg by mouth.    [provider]  tamsulosin (FLOMAX) 0.4 MG CAPS capsule TAKE 1 CAPSULE(0.4 MG) BY MOUTH DAILY AFTER SUPPER 08/19/21   Tysinger, Camelia Eng, PA-C    Family History Family History  Problem Relation Age of Onset   Cancer Father 2       melonoma   Heart  attack Maternal Grandmother    Hypertension Maternal Grandmother    Heart attack Maternal Grandfather    Hypertension Maternal Grandfather    Kidney disease Brother    Social History Social History   Tobacco Use   Smoking status: Former    Types: Cigarettes    Quit date: 01/26/1974    Years since quitting: 47.8    Passive exposure: Past   Smokeless tobacco: Never  Vaping Use   Vaping Use: Never used  Substance Use Topics   Alcohol use: No   Drug use: No   Allergies    Levofloxacin  Review of Systems Review of Systems Pertinent findings revealed after performing a 14 point review of systems has been noted in the history of present illness.  Physical Exam Triage Vital Signs ED Triage Vitals  Enc Vitals Group     BP 11/22/20 0827 (!) 147/82     Pulse Rate 11/22/20 0827 72     Resp 11/22/20 0827 18     Temp 11/22/20 0827 98.3 F (36.8 C)     Temp Source 11/22/20 0827 Oral     SpO2 11/22/20 0827 98 %     Weight --      Height --      Head Circumference --      Peak Flow --      Pain Score 11/22/20 0826 5     Pain Loc --      Pain Edu? --      Excl. in [place]? --   No data found.  Updated Vital Signs BP (!) 159/78 (BP Location: Left Arm)   Pulse 63   Temp 98.2 F (36.8 C) (Oral)   Resp 16   SpO2 97%   Physical Exam Vitals and nursing note reviewed. Exam conducted with a chaperone present.  Constitutional:      General: He is awake. He is not in acute distress.    Appearance: Normal appearance. He is well-developed and well-groomed. He is not ill-appearing.  HENT:     Head: Normocephalic and atraumatic.     Salivary Glands: Right salivary gland is not diffusely enlarged or tender. Left salivary gland is not diffusely enlarged or tender.     Right Ear: Hearing, ear canal and external ear normal. No drainage or tenderness. A middle ear effusion is present. There is no impacted cerumen. Tympanic membrane is bulging. Tympanic membrane is not injected, scarred, erythematous or retracted.     Left Ear: Hearing, ear canal and external ear normal. No drainage or tenderness. A middle ear effusion is present. There is no impacted cerumen. Tympanic membrane is bulging. Tympanic membrane is not injected, scarred, erythematous or retracted.     Nose: Rhinorrhea present. No nasal deformity, septal deviation, signs of injury, nasal tenderness, mucosal edema or congestion. Rhinorrhea is clear.     Right Nostril: Occlusion present. No foreign body, epistaxis  or septal hematoma.     Left Nostril: Occlusion present. No foreign body, epistaxis or septal hematoma.     Right Turbinates: Enlarged, swollen and pale.     Left Turbinates: Enlarged, swollen and pale.     Right Sinus: Maxillary sinus tenderness present. No frontal sinus tenderness.     Left Sinus: No maxillary sinus tenderness or frontal sinus tenderness.     Mouth/Throat:     Lips: Pink. No lesions.     Mouth: Mucous membranes are moist. No oral lesions.     Tongue: No lesions.     Palate:  No mass.     Pharynx: Oropharynx is clear. Uvula midline. No pharyngeal swelling, oropharyngeal exudate, posterior oropharyngeal erythema or uvula swelling.     Tonsils: No tonsillar exudate. 0 on the right. 0 on the left.     Comments: Postnasal drip Eyes:     General: Lids are normal.        Right eye: No discharge.        Left eye: No discharge.     Extraocular Movements: Extraocular movements intact.     Conjunctiva/sclera: Conjunctivae normal.     Right eye: Right conjunctiva is not injected.     Left eye: Left conjunctiva is not injected.  Neck:     Trachea: Trachea and phonation normal.  Cardiovascular:     Rate and Rhythm: Normal rate and regular rhythm. No extrasystoles are present.    Pulses: Normal pulses.     Heart sounds: Normal heart sounds, S1 normal and S2 normal. Heart sounds not distant. No murmur heard.    No systolic murmur is present.     No diastolic murmur is present.     No friction rub. No gallop. No S3 or S4 sounds.  Pulmonary:     Effort: Pulmonary effort is normal. No tachypnea, bradypnea, accessory muscle usage, prolonged expiration, respiratory distress or retractions.     Breath sounds: Normal breath sounds. No stridor, decreased air movement or transmitted upper airway sounds. No decreased breath sounds, wheezing, rhonchi or rales.  Chest:     Chest wall: No tenderness.  Musculoskeletal:        General: Normal range of motion.     Cervical back: Full passive  range of motion without pain, normal range of motion and neck supple. Normal range of motion.     Right lower leg: No edema.     Left lower leg: No edema.  Lymphadenopathy:     Cervical: Cervical adenopathy present.     Right cervical: Posterior cervical adenopathy present.     Left cervical: Posterior cervical adenopathy present.  Skin:    General: Skin is warm and dry.     Findings: No erythema or rash.  Neurological:     General: No focal deficit present.     Mental Status: He is alert and oriented to person, place, and time.  Psychiatric:        Mood and Affect: Mood normal.        Behavior: Behavior normal. Behavior is cooperative.     Visual Acuity Right Eye Distance:   Left Eye Distance:   Bilateral Distance:    Right Eye Near:   Left Eye Near:    Bilateral Near:     UC Couse / Diagnostics / Procedures:     Radiology No results found.  Procedures Procedures (including critical care time) EKG  Pending results:  Labs Reviewed - No data to display  Medications Ordered in UC: Medications - No data to display  UC Diagnoses / Final Clinical Impressions(s)   I have reviewed the triage vital signs and the nursing notes.  Pertinent labs & imaging results that were available during my care of the patient were reviewed by me and considered in my medical decision making (see chart for details).    Final diagnoses:  Acute bacterial sinusitis  Middle ear effusion, right  Allergic rhinitis, unspecified seasonality, unspecified trigger  Rhinosinusitis   Recommend 7-day course of Augmentin for what appears to be bacterial sinusitis causing middle ear effusion without acute otitis media.  Patient also advised to resume allergy medications, recommend Zyrtec and Flonase, prescription sent to pharmacy.  Patient advised to continue to monitor symptoms, particularly for fever, and return as needed for repeat evaluation this occurs.  Patient advised to follow-up with PCP  regarding allergy symptoms.  Breath sounds good at this time, not concerning lower respiratory inflammation.  ED Prescriptions     Medication Sig Dispense Auth. Provider   cetirizine (ZYRTEC ALLERGY) 10 MG tablet Take 1 tablet (10 mg total) by mouth at bedtime. 90 tablet Sharone, Almond Scales, PA-C   fluticasone (FLONASE) 50 MCG/ACT nasal spray Place 1 spray into both nostrils daily. Begin by using 2 sprays in each nare daily for 3 to 5 days, then decrease to 1 spray in each nare daily. 15.8 mL Lynden Oxford Scales, PA-C   amoxicillin-clavulanate (AUGMENTIN) 875-125 MG tablet Take 1 tablet by mouth 2 (two) times daily for 7 days. 14 tablet Lynden Oxford Scales, PA-C      PDMP not reviewed this encounter.  Disposition Upon Discharge:  Condition: stable for discharge home Home: take medications as prescribed; routine discharge instructions as discussed; follow up as advised.  Patient presented with an acute illness with associated systemic symptoms and significant discomfort requiring urgent management. In my opinion, this is a condition that a prudent lay person (someone who possesses an average knowledge of health and medicine) may potentially expect to result in complications if not addressed urgently such as respiratory distress, impairment of bodily function or dysfunction of bodily organs.   Routine symptom specific, illness specific and/or disease specific instructions were discussed with the patient and/or caregiver at length.   As such, the patient has been evaluated and assessed, work-up was performed and treatment was provided in alignment with urgent care protocols and evidence based medicine.  Patient/parent/caregiver has been advised that the patient may require follow up for further testing and treatment if the symptoms continue in spite of treatment, as clinically indicated and appropriate.  If the patient was tested for COVID-19, Influenza and/or RSV, then the  patient/parent/guardian was advised to isolate at home pending the results of his/her diagnostic coronavirus test and potentially longer if they're positive. I have also advised pt that if his/her COVID-19 test returns positive, it's recommended to self-isolate for at least 10 days after symptoms first appeared AND until fever-free for 24 hours without fever reducer AND other symptoms have improved or resolved. Discussed self-isolation recommendations as well as instructions for household member/close contacts as per the [facility] and [place] DHHS, and also gave patient the [place] packet with this information.  Patient/parent/caregiver has been advised to return to the [facility] or PCP in 3-5 days if no better; to PCP or the Emergency Department if new signs and symptoms develop, or if the current signs or symptoms continue to change or worsen for further workup, evaluation and treatment as clinically indicated and appropriate  The patient will follow up with their current PCP if and as advised. If the patient does not currently have a PCP we will assist them in obtaining one.   The patient may need specialty follow up if the symptoms continue, in spite of conservative treatment and management, for further workup, evaluation, consultation and treatment as clinically indicated and appropriate.  Patient/parent/caregiver verbalized understanding and agreement of plan as discussed.  All questions were addressed during visit.  Please see discharge instructions below for further details of plan.  Discharge Instructions:   Discharge Instructions      I have enclosed  some information about sinus infections and allergic rhinitis that I hope you find helpful.  Please read below to learn more about the medications, dosages and frequencies that I recommend to help alleviate your symptoms and to get you feeling better soon:   Augmentin (amoxicillin - clavulanic acid):  take 1 tablet twice daily for 7 days, you can take it with  or without food.  This antibiotic can cause upset stomach, this will resolve once antibiotics are complete.  You are welcome to use a probiotic, eat yogurt, take Imodium while taking this medication.  Please avoid other systemic medications such as Maalox, Pepto-Bismol or milk of magnesia as they can interfere with your body's ability to absorb the antibiotics.      Zyrtec (cetirizine): This is an excellent second-generation antihistamine that helps to reduce respiratory inflammatory response to environmental allergens.  In some patients, this medication can cause daytime sleepiness so I recommend that you take 1 tablet daily at bedtime.     Flonase (fluticasone): This is a steroid nasal spray that you use once daily, 1 spray in each nare.  This medication does not work well if you decide to use it only used as you feel you need to, it works best used on a daily basis.  After 3 to 5 days of use, you will notice significant reduction of the inflammation and mucus production that is currently being caused by exposure to allergens, whether seasonal or environmental.  The most common side effect of this medication is nosebleeds.  If you experience a nosebleed, please discontinue use for 1 week, then feel free to resume.  I have provided you with a prescription.     Please follow-up within the next 5-7 days either with your primary care provider or urgent care if your symptoms do not resolve.  If you do not have a primary care provider, we will assist you in finding one.        Thank you for visiting urgent care today.  We appreciate the opportunity to participate in your care.       This office note has been dictated using Museum/gallery curator.  Unfortunately, this method of dictation can sometimes lead to typographical or grammatical errors.  I apologize for your inconvenience in advance if this occurs.  Please do not hesitate to reach out to me if clarification is needed.      Kathy, Wares Scales, PA-C 12/03/21 [phone]

## 2021-11-30 NOTE — ED Triage Notes (Signed)
Pt reports having congestion (green colored mucus) that is worse at night.   Started: a few weeks   Home interventions: mucinex

## 2021-12-05 ENCOUNTER — Ambulatory Visit (INDEPENDENT_AMBULATORY_CARE_PROVIDER_SITE_OTHER): Payer: Medicare HMO | Admitting: Medical

## 2021-12-05 VITALS — BP 130/80 | HR 91 | Wt 150.8 lb

## 2021-12-05 DIAGNOSIS — R350 Frequency of micturition: Secondary | ICD-10-CM | POA: Diagnosis not present

## 2021-12-05 DIAGNOSIS — N401 Enlarged prostate with lower urinary tract symptoms: Secondary | ICD-10-CM | POA: Insufficient documentation

## 2021-12-05 DIAGNOSIS — I700 Atherosclerosis of aorta: Secondary | ICD-10-CM | POA: Diagnosis not present

## 2021-12-05 DIAGNOSIS — Z7185 Encounter for immunization safety counseling: Secondary | ICD-10-CM | POA: Diagnosis not present

## 2021-12-05 DIAGNOSIS — E559 Vitamin D deficiency, unspecified: Secondary | ICD-10-CM | POA: Diagnosis not present

## 2021-12-05 DIAGNOSIS — J309 Allergic rhinitis, unspecified: Secondary | ICD-10-CM | POA: Diagnosis not present

## 2021-12-05 DIAGNOSIS — N400 Enlarged prostate without lower urinary tract symptoms: Secondary | ICD-10-CM

## 2021-12-05 DIAGNOSIS — C9110 Chronic lymphocytic leukemia of B-cell type not having achieved remission: Secondary | ICD-10-CM | POA: Diagnosis not present

## 2021-12-05 MED ORDER — TAMSULOSIN HCL 0.4 MG PO CAPS: ORAL_CAPSULE | ORAL | 3 refills | Status: DC

## 2021-12-05 NOTE — Progress Notes (Signed)
Subjective:  Tyler Wu is a 76 y.o. male who presents for Chief Complaint  Patient presents with   med check    Med check- decline covid shot.      Here for med check.    He needs a refill on Flomax.  He takes this for BPH symptoms.  He still has some nocturia and some urinary frequency but this medicine has been helpful  He has a history of CLL and saw hematology recently for yearly follow-up.  Had a blood count recent that was stable.  He is using his allergy medicine as usual  Otherwise doing well in usual state of health.  Still working 4 days a week.  Active.  He has declined statins in the past  No other aggravating or relieving factors.    No other c/o.  The following portions of the patient's history were reviewed and updated as appropriate: allergies, current medications, past family history, past medical history, past social history, past surgical history and problem list.  ROS Otherwise as in subjective above  Objective: BP 130/80   Pulse 91   Wt 150 lb 12.8 oz (68.4 kg)   BMI 23.62 kg/m   General appearance: alert, no distress, well developed, well nourished Neck: supple, no lymphadenopathy, no thyromegaly, no masses Heart: RRR, normal S1, S2, no murmurs Lungs: CTA bilaterally, no wheezes, rhonchi, or rales Abdomen: +bs, soft, non tender, non distended, no masses, no hepatomegaly, no splenomegaly Pulses: 2+ radial pulses, 2+ pedal pulses, normal cap refill Ext: no edema   Assessment: Encounter Diagnoses  Name Primary?   Benign prostatic hyperplasia with urinary frequency Yes   Aortic atherosclerosis (HCC)    CLL (chronic lymphocytic leukemia) (HCC)    Enlarged prostate    Vitamin D deficiency    Vaccine counseling    Allergic rhinitis, unspecified seasonality, unspecified trigger      Plan: BPH, enlarged prostate-continue Flomax  Aortic atherosclerosis-we discussed the finding on the prior CT.  I recommended statin and low-cholesterol  diet.  He declines statin  I recommended a baseline CT coronary calcium score to screen for heart disease and coronary artery disease.  He will consider but declines today.  We will get a copy of his prior Tdap vaccine and possibly pneumonia vaccine done at the pharmacy  He declines COVID-vaccine  Allergic rhinitis-doing okay on current regimen  [name] was seen today for med check.  Diagnoses and all orders for this visit:  Benign prostatic hyperplasia with urinary frequency  Aortic atherosclerosis (HCC)  CLL (chronic lymphocytic leukemia) ([place])  Enlarged prostate  Vitamin D deficiency  Vaccine counseling  Allergic rhinitis, unspecified seasonality, unspecified trigger  Other orders -     tamsulosin (FLOMAX) 0.4 MG CAPS capsule; TAKE 1 CAPSULE(0.4 MG) BY MOUTH DAILY AFTER SUPPER   Follow up: March 2024 as planned for well visit

## 2021-12-17 ENCOUNTER — Encounter: Payer: Medicare HMO | Admitting: Medical

## 2022-01-01 ENCOUNTER — Telehealth: Payer: Self-pay | Admitting: Medical

## 2022-01-01 NOTE — Telephone Encounter (Signed)
Called pt's pharmacy concerning vaccines received.  Pt received Shingrix 03/14/2021 Lot # [id number] in Left arm  Pt received Moderna Covid Booster 03/14/2021 Lot # [id number] in Right Arm  PT did not return for 2 shingrix at that Pharmacy.

## 2022-01-01 NOTE — Telephone Encounter (Signed)
Vaccines updated in chart

## 2022-01-15 ENCOUNTER — Ambulatory Visit
Admission: RE | Admit: 2022-01-15 | Discharge: 2022-01-15 | Disposition: A | Discharge status: Home / Self Care | Payer: Medicare HMO | Source: Ambulatory Visit | Attending: Family Medicine | Admitting: Family Medicine

## 2022-01-15 VITALS — BP 168/77 | HR 70 | Temp 98.2°F | Resp 18

## 2022-01-15 DIAGNOSIS — J0100 Acute maxillary sinusitis, unspecified: Secondary | ICD-10-CM | POA: Diagnosis not present

## 2022-01-15 MED ORDER — AMOXICILLIN-POT CLAVULANATE 875-125 MG PO TABS: 1.0000 | ORAL_TABLET | Freq: Two times a day (BID) | ORAL | 0 refills | Status: DC

## 2022-01-15 NOTE — ED Triage Notes (Signed)
Pt is present today with c/o congestion.   Onset~ yesterday

## 2022-01-15 NOTE — ED Provider Notes (Signed)
[facility]   [id number] 01/15/22 Arrival Time: 1252  ASSESSMENT & PLAN:  1. Acute non-recurrent maxillary sinusitis    Begin: Meds ordered this encounter  Medications   amoxicillin-clavulanate (AUGMENTIN) 875-125 MG tablet    Sig: Take 1 tablet by mouth every 12 (twelve) hours.    Dispense:  20 tablet    Refill:  0   Discussed typical duration of symptoms. OTC symptom care as needed. Ensure adequate fluid intake and rest.   Follow-up Information     Tysinger, Camelia Eng, PA-C.   Specialty: Family Medicine Why: If worsening or failing to improve as anticipated. Contact information: [address] [place] [address] [phone]                 Reviewed expectations re: course of current medical issues. Questions answered. Outlined signs and symptoms indicating need for more acute intervention. Patient verbalized understanding. After Visit Summary given.   SUBJECTIVE: History from: patient.  Tyler Wu is a 76 y.o. male who presents with complaint of nasal congestion, post-nasal drainage, and sinus pain. Onset gradual,  over one week ago . Respiratory symptoms: none. Fever: absent. Overall normal PO intake without n/v. OTC treatment: Mucinex; mild help. Seasonal allergies: no. History of frequent sinus infections: no. No specific aggravating or alleviating factors reported. Social History   Tobacco Use  Smoking Status Former   Types: Cigarettes   Quit date: 01/26/1974   Years since quitting: 48.0   Passive exposure: Past  Smokeless Tobacco Never    ROS: As per HPI.  OBJECTIVE:  Vitals:   01/15/22 1330  BP: (!) 168/77  Pulse: 70  Resp: 18  Temp: 98.2 F (36.8 C)  SpO2: 97%     General appearance: alert; no distress HEENT: nasal congestion; clear runny nose; throat irritation secondary to post-nasal drainage; LEFT maxillary tenderness to palpation; turbinates boggy Neck: supple without LAD; trachea midline Lungs: unlabored  respirations, symmetrical air entry; cough: absent; no respiratory distress Skin: warm and dry Psychological: alert and cooperative; normal mood and affect  Allergies  Allergen Reactions   Levofloxacin Other (See Comments)    Bad dreams     Past Medical History:  Diagnosis Date   Allergy    Aortic atherosclerosis ([place]) 10/2019   per CT chest   BPH (benign prostatic hyperplasia)    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   History of COVID-19 2021   Family History  Problem Relation Age of Onset   Cancer Father 59       melonoma   Heart attack Maternal Grandmother    Hypertension Maternal Grandmother    Heart attack Maternal Grandfather    Hypertension Maternal Grandfather    Kidney disease Brother    Social History   Socioeconomic History   Marital status: Married    Spouse name: Not on file   Number of children: 2   Years of education: Not on file   Highest education level: Associate degree: academic program  Occupational History   Occupation: purchasing  Tobacco Use   Smoking status: Former    Types: Cigarettes    Quit date: 01/26/1974    Years since quitting: 48.0    Passive exposure: Past   Smokeless tobacco: Never  Vaping Use   Vaping Use: Never used  Substance and Sexual Activity   Alcohol use: No   Drug use: No   Sexual activity: Yes    Birth control/protection: None  Comment: number of sex partners in the last 59 months  1  Other Topics Concern   Not on file  Social History Narrative   Lives with wife.   Active on job at [employer] center.  Hobbies - guitar, golf, spending time with grandkids, movies, reading.   Goes to church sometimes, Probation officer.  04/2020   Social Determinants of Health   Financial Resource Strain: Low Risk  (03/14/2021)   Overall Financial Resource Strain (CARDIA)    Difficulty of Paying Living Expenses: Not hard at all  Food Insecurity: No Food Insecurity (03/14/2021)   Hunger Vital  Sign    Worried About Running Out of Food in the Last Year: Never true    Ran Out of Food in the Last Year: Never true  Transportation Needs: No Transportation Needs (03/14/2021)   PRAPARE - Hydrologist (Medical): No    Lack of Transportation (Non-Medical): No  Physical Activity: Sufficiently Active (03/14/2021)   Exercise Vital Sign    Days of Exercise per Week: 4 days    Minutes of Exercise per Session: 150+ min  Stress: No Stress Concern Present (03/14/2021)   [place]    Feeling of Stress : Not at all  Social Connections: Moderately Integrated (03/30/2017)   Social Connection and Isolation Panel [NHANES]    Frequency of Communication with Friends and Family: More than three times a week    Frequency of Social Gatherings with Friends and Family: More than three times a week    Attends Religious Services: More than 4 times per year    Active Member of [employer] or Organizations: No    Attends Archivist Meetings: Never    Marital Status: Married  Human resources officer Violence: Not At Risk (03/30/2017)   Humiliation, Afraid, Rape, and Kick questionnaire    Fear of Current or Ex-Partner: No    Emotionally Abused: No    Physically Abused: No    Sexually Abused: No             Vanessa Kick, MD 01/15/22 1432

## 2022-01-26 DIAGNOSIS — I10 Essential (primary) hypertension: Secondary | ICD-10-CM

## 2022-01-26 HISTORY — DX: Essential (primary) hypertension: I10

## 2022-03-16 ENCOUNTER — Telehealth: Payer: Self-pay | Admitting: Medical

## 2022-03-16 NOTE — Telephone Encounter (Signed)
Contacted [name] to schedule their annual wellness visit. Appointment made for 03/24/22.  [facility] direct phone # [phone]    Schedule change moved from 2/23 to 2/27 Pt aware of appt date/time change

## 2022-03-17 ENCOUNTER — Ambulatory Visit
Admission: EM | Admit: 2022-03-17 | Discharge: 2022-03-17 | Disposition: A | Discharge status: Home / Self Care | Payer: Medicare HMO | Attending: Physician Assistant | Admitting: Physician Assistant

## 2022-03-17 ENCOUNTER — Encounter: Payer: Self-pay | Admitting: *Deleted

## 2022-03-17 DIAGNOSIS — H8112 Benign paroxysmal vertigo, left ear: Secondary | ICD-10-CM

## 2022-03-17 DIAGNOSIS — J069 Acute upper respiratory infection, unspecified: Secondary | ICD-10-CM | POA: Diagnosis not present

## 2022-03-17 MED ORDER — MECLIZINE HCL 12.5 MG PO TABS: 12.5000 mg | ORAL_TABLET | Freq: Three times a day (TID) | ORAL | 1 refills | Status: DC | PRN

## 2022-03-17 NOTE — ED Triage Notes (Signed)
Pt states he had some vertigo Friday he took some dramamine and it helped some. Today it returned and he took another dramamine. He feels like his left ear is full and maybe this issue.

## 2022-03-17 NOTE — Discharge Instructions (Signed)
Advised take the meclizine every 8 hours on a regular basis for the next several days to help control the off-balance and dizzy sensation. Advise use of Flonase nasal spray, 2 sprays each nostril once daily to help decrease sinus congestion, encourage drainage, and help to relieve pressure from the ears.  Advised to consider having the ears flushed once symptoms resolve as there is some wax present on both sides and doing that today would make the vertigo worse.  Follow-up PCP or return to urgent care as needed.

## 2022-03-17 NOTE — ED Provider Notes (Signed)
[name]    CSN: [id number] Arrival date & time: 03/17/22  0855      History   Chief Complaint Chief Complaint  Patient presents with   Ear Fullness   Dizziness    HPI Tyler Wu is a 77 y.o. male.   77 year old male presents with dizziness and off-balance sensation.  Patient indicates for the past 2 to 3 days he has been having intermittent dizziness, off-balance sensation, tends to be worse with positional changes such as turning, bending over.  Patient indicates that he feels it worse on the left side as compared to the right.  He indicates this morning when he was in bed he felt like the room was starting to spin.  He has been taking some Dramamine OTC which has given him some improvement of his symptoms.  He indicates he did have some mild nausea when it first started but he did not have any episodes of vomiting.  He has had some mild upper respiratory sinus congestion since the symptoms started.  He is not having any fever, chills, chest pain or shortness of breath.  He indicates he is not having any unusual upper or lower extremity numbness, tingling, or weakness.   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   History of COVID-19 2021    Patient Active Problem List   Diagnosis Date Noted   Benign prostatic hyperplasia with urinary frequency 12/05/2021   Thrombocytopenia ([place]) 11/25/2020   Injury of nail bed of finger 11/25/2020   Vitamin D deficiency 08/26/2020   Encounter for health maintenance examination in adult 05/02/2020   Medicare annual wellness visit, subsequent 05/02/2020   Screening for lipid disorders 05/02/2020   Vaccine counseling 05/02/2020   Vitamin deficiency 05/02/2020   Screen for colon cancer 05/02/2020   Advanced  directives, counseling/discussion 05/02/2020   Frequent urination 03/08/2020   Enlarged prostate 03/08/2020   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   Urinary urgency 03/08/2020   Fatigue 11/17/2019   Sinus pressure 11/13/2019   History of COVID-19 11/13/2019   Loss of smell 11/13/2019   COVID-19 virus infection 11/03/2019   SOB (shortness of breath) 11/03/2019   Aortic atherosclerosis ([place]) 11/03/2019   History of skin cancer 11/24/2016   Lymphadenopathy of head and neck 11/22/2014   Multiple skin nodules 11/22/2013   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 Authorizing Provider  aspirin 81 MG chewable tablet Chew 81 mg by mouth.   Yes [provider]  cetirizine (ZYRTEC ALLERGY) 10 MG tablet Take 1 tablet (10 mg total) by mouth at bedtime. 11/30/21 05/29/22 Yes Lynden Oxford Scales, PA-C  fluticasone (FLONASE) 50 MCG/ACT nasal spray Place 1 spray into both nostrils daily. Begin by using 2 sprays in each nare daily for 3 to 5 days, then decrease to 1 spray in each nare daily. 11/30/21  Yes Lynden Oxford Scales, PA-C  meclizine (ANTIVERT) 12.5 MG tablet Take 1 tablet (12.5 mg total) by mouth  3 (three) times daily as needed for dizziness. 03/17/22  Yes Nyoka Lint, PA-C  tamsulosin (FLOMAX) 0.4 MG CAPS capsule TAKE 1 CAPSULE(0.4 MG) BY MOUTH DAILY AFTER SUPPER 12/05/21  Yes Tysinger, Camelia Eng, PA-C  amoxicillin-clavulanate (AUGMENTIN) 875-125 MG tablet Take 1 tablet by mouth every 12 (twelve) hours. 01/15/22   Vanessa Kick, MD    Family History Family History  Problem Relation Age of Onset   Cancer Father 10       melonoma   Heart attack Maternal Grandmother    Hypertension Maternal Grandmother    Heart attack Maternal  Grandfather    Hypertension Maternal Grandfather    Kidney disease Brother     Social History Social History   Tobacco Use   Smoking status: Former    Types: Cigarettes    Quit date: 01/26/1974    Years since quitting: 48.1    Passive exposure: Past   Smokeless tobacco: Never  Vaping Use   Vaping Use: Never used  Substance Use Topics   Alcohol use: No   Drug use: No     Allergies   Levofloxacin   Review of Systems Review of Systems  Neurological:  Positive for dizziness.     Physical Exam Triage Vital Signs ED Triage Vitals  Enc Vitals Group     BP 03/17/22 0919 (!) 172/80     Pulse Rate 03/17/22 0919 75     Resp 03/17/22 0919 18     Temp 03/17/22 0919 98.1 F (36.7 C)     Temp Source 03/17/22 0919 Oral     SpO2 03/17/22 0919 98 %     Weight --      Height --      Head Circumference --      Peak Flow --      Pain Score 03/17/22 0918 0     Pain Loc --      Pain Edu? --      Excl. in [place]? --    No data found.  Updated Vital Signs BP (!) 172/80 (BP Location: Left Arm)   Pulse 75   Temp 98.1 F (36.7 C) (Oral)   Resp 18   SpO2 98%   Visual Acuity Right Eye Distance:   Left Eye Distance:   Bilateral Distance:    Right Eye Near:   Left Eye Near:    Bilateral Near:     Physical Exam Constitutional:      Appearance: Normal appearance.  HENT:     Right Ear: There is impacted cerumen.     Left Ear: There is impacted cerumen.     Mouth/Throat:     Mouth: Mucous membranes are moist.     Pharynx: Oropharynx is clear. Uvula midline.  Cardiovascular:     Rate and Rhythm: Normal rate and regular rhythm.     Heart sounds: Normal heart sounds.  Pulmonary:     Effort: Pulmonary effort is normal.     Breath sounds: Normal breath sounds and air entry. No wheezing, rhonchi or rales.  Lymphadenopathy:     Cervical: No cervical adenopathy.  Neurological:     Mental Status: He is alert and oriented to person, place, and time.     Cranial Nerves: Cranial  nerves 2-12 are intact.     Motor: Motor function is intact.     Coordination: Coordination is intact.      UC Treatments / Results  Labs (all labs ordered are listed, but only abnormal results are displayed)  Labs Reviewed - No data to display  EKG   Radiology No results found.  Procedures Procedures (including critical care time)  Medications Ordered in UC Medications - No data to display  Initial Impression / Assessment and Plan / UC Course  I have reviewed the triage vital signs and the nursing notes.  Pertinent labs & imaging results that were available during my care of the patient were reviewed by me and considered in my medical decision making (see chart for details).    Plan: The diagnosis will be treated with the following: 1.  Upper respiratory tract infection: A.  Flonase nasal spray, 2 sprays each nostril once daily to help decrease sinus congestion and encourage drainage, patient already has this medication at home. 2.  Benign vertigo: A.  Antivert 12.5 mg every 8 hours to help decrease dizziness and off-balance sensation. 3.  The patient is advised that once symptoms improve and resolve he should consider having his ears irrigated since he does have cerumen impaction bilaterally. Advised follow-up PCP or return to urgent care as needed. Final Clinical Impressions(s) / UC Diagnoses   Final diagnoses:  Acute upper respiratory infection  Benign paroxysmal positional vertigo of left ear     Discharge Instructions      Advised take the meclizine every 8 hours on a regular basis for the next several days to help control the off-balance and dizzy sensation. Advise use of Flonase nasal spray, 2 sprays each nostril once daily to help decrease sinus congestion, encourage drainage, and help to relieve pressure from the ears.  Advised to consider having the ears flushed once symptoms resolve as there is some wax present on both sides and doing that today would  make the vertigo worse.  Follow-up PCP or return to urgent care as needed.    ED Prescriptions     Medication Sig Dispense Auth. Provider   meclizine (ANTIVERT) 12.5 MG tablet Take 1 tablet (12.5 mg total) by mouth 3 (three) times daily as needed for dizziness. 30 tablet Nyoka Lint, PA-C      PDMP not reviewed this encounter.   Nyoka Lint, PA-C 03/17/22 [phone]

## 2022-03-20 ENCOUNTER — Ambulatory Visit: Payer: Medicare HMO

## 2022-03-24 ENCOUNTER — Ambulatory Visit (INDEPENDENT_AMBULATORY_CARE_PROVIDER_SITE_OTHER): Payer: Medicare HMO

## 2022-03-24 VITALS — Ht 67.5 in | Wt 153.0 lb

## 2022-03-24 DIAGNOSIS — Z0000 Encounter for general adult medical examination without abnormal findings: Secondary | ICD-10-CM | POA: Diagnosis not present

## 2022-03-24 NOTE — Progress Notes (Signed)
I connected with  Tyler Wu on 03/24/22 by a video and audio enabled telemedicine application and verified that I am speaking with the correct person using two identifiers.  Patient Location: Home  Provider Location: Office/Clinic  I discussed the limitations of evaluation and management by telemedicine. The patient expressed understanding and agreed to proceed.  Subjective:   Tyler Wu is a 77 y.o. male who presents for Medicare Annual/Subsequent preventive examination.  Review of Systems     Cardiac Risk Factors include: advanced age (>74mn, >>38women);male gender     Objective:    Today's Vitals   03/24/22 1435  Weight: 153 lb (69.4 kg)  Height: 5' 7.5" (1.715 m)   Body mass index is 23.61 kg/m.     03/24/2022    2:41 PM 03/14/2021    9:57 AM 07/06/2018    8:02 AM 03/30/2017    8:52 AM 11/21/2015    1:34 PM 11/22/2014    1:41 PM  Advanced Directives  Does Patient Have a Medical Advance Directive? Yes Yes No;Yes Yes No No  Type of AParamedicof [place]outh [place] will [place] will Living will;Healthcare Power of Attorney     Does patient want to make changes to medical advance directive?   No - Patient declined Yes (MAU/Ambulatory/Procedural Areas - Information given)    Copy of [place] Chart? No - copy requested No - copy requested No - copy requested     Would patient like information on creating a medical advance directive?   No - Patient declined  No - patient declined information No - patient declined information    Current Medications (verified) Outpatient Encounter Medications as of 03/24/2022  Medication Sig   aspirin 81 MG chewable tablet Chew 81 mg by mouth.   cetirizine (ZYRTEC ALLERGY) 10 MG tablet Take 1 tablet (10 mg total) by mouth at bedtime.   fluticasone (FLONASE) 50 MCG/ACT nasal spray Place 1 spray into both nostrils daily. Begin by using 2 sprays in each nare daily  for 3 to 5 days, then decrease to 1 spray in each nare daily.   meclizine (ANTIVERT) 12.5 MG tablet Take 1 tablet (12.5 mg total) by mouth 3 (three) times daily as needed for dizziness.   tamsulosin (FLOMAX) 0.4 MG CAPS capsule TAKE 1 CAPSULE(0.4 MG) BY MOUTH DAILY AFTER SUPPER   amoxicillin-clavulanate (AUGMENTIN) 875-125 MG tablet Take 1 tablet by mouth every 12 (twelve) hours.   No facility-administered encounter medications on file as of 03/24/2022.    Allergies (verified) Levofloxacin   History: Past Medical History:  Diagnosis Date   Allergy    [place] [place]   per CT chest   BPH (benign prostatic hyperplasia)    C[place]ocytic [place]) [place]   followed by oncology yearly.   History of COVID-19 2021   Past Surgical History:  Procedure Laterality Date   COLONOSCOPY     around 2012   [place]  Family History  Problem Relation Age of Onset   Cancer Father 543      melonoma   Heart attack Maternal Grandmother    Hypertension Maternal Grandmother    Heart attack Maternal Grandfather    Hypertension Maternal Grandfather    Kidney disease Brother    Social History   Socioeconomic History   Marital status: Married    Spouse name: Not on file   Number of children: 2  Years of education: Not on file   Highest education level: Associate degree: academic program  Occupational History   Occupation: purchasing  Tobacco Use   Smoking status: Former    Types: Cigarettes    Quit date: 01/26/1974    Years since quitting: 48.1    Passive exposure: Past   Smokeless tobacco: Never  Vaping Use   Vaping Use: Never used  Substance and Sexual Activity   Alcohol use: No   Drug use: No   Sexual activity: Yes    Birth control/protection: None    Comment: number of sex partners in the last 73 months  1  Other Topics Concern   Not on file  Social History Narrative   Lives with wife.   Active on job at [employer] center.   Hobbies - guitar, golf, spending time with grandkids, movies, reading.   Goes to church sometimes, Probation officer.  04/2020   Social Determinants of Health   Financial Resource Strain: Low Risk  (03/24/2022)   Overall Financial Resource Strain (CARDIA)    Difficulty of Paying Living Expenses: Not hard at all  Food Insecurity: No Food Insecurity (03/24/2022)   Hunger Vital Sign    Worried About Running Out of Food in the Last Year: Never true    Ran Out of Food in the Last Year: Never true  Transportation Needs: No Transportation Needs (03/24/2022)   PRAPARE - Hydrologist (Medical): No    Lack of Transportation (Non-Medical): No  Physical Activity: Inactive (03/24/2022)   Exercise Vital Sign    Days of Exercise per Week: 0 days    Minutes of Exercise per Session: 0 min  Stress: No Stress Concern Present (03/24/2022)   [place]    Feeling of Stress : Not at all  Social Connections: Moderately Integrated (03/30/2017)   Social Connection and Isolation Panel [NHANES]    Frequency of Communication with Friends and Family: More than three times a week    Frequency of Social Gatherings with Friends and Family: More than three times a week    Attends Religious Services: More than 4 times per year    Active Member of [employer] or Organizations: No    Attends Archivist Meetings: Never    Marital Status: Married    Tobacco Counseling Counseling given: Not Answered   Clinical Intake:  Pre-visit preparation completed: Yes  Pain : No/denies pain     Nutritional Status: BMI of 19-24  Normal Nutritional Risks: None Diabetes: No  How often do you need to have someone help you when you read instructions, pamphlets, or other written materials from your doctor or pharmacy?: 1 - Never  Diabetic? no  Interpreter Needed?: No  Information entered by :: NAllen LPN   Activities of Daily Living     03/24/2022    2:43 PM  In your present state of health, do you have any difficulty performing the following activities:  Hearing? 0  Vision? 0  Difficulty concentrating or making decisions? 0  Walking or climbing stairs? 0  Dressing or bathing? 0  Doing errands, shopping? 0  Preparing Food and eating ? N  Using the Toilet? N  In the past six months, have you accidently leaked urine? N  Do you have problems with loss of bowel control? N  Managing your Medications? N  Managing your Finances? N  Housekeeping or managing your Housekeeping? N    Patient Care  Team: Tysinger, Camelia Eng, PA-C as PCP - General (Family Medicine) Heath Lark, MD as Consulting Physician (Hematology and Oncology)  Indicate any recent Medical Services you may have received from other than [facility] providers in the past year (date may be approximate).     Assessment:   This is a routine wellness examination for Tomi.  Hearing/Vision screen Vision Screening - Comments:: Regular eye exams, [facility]  Dietary issues and exercise activities discussed: Current Exercise Habits: The patient has a physically strenuous job, but has no regular exercise apart from work.   Goals Addressed             This Visit's Progress    Patient Stated       03/24/2022, maintain current health and stay healthy       Depression Screen    03/24/2022    2:42 PM 12/05/2021    3:12 PM 03/14/2021    9:57 AM 02/10/2021    3:11 PM 05/02/2020    8:31 AM 08/02/2018    8:31 AM 07/06/2018    8:03 AM  PHQ 2/9 Scores  PHQ - 2 Score 0 0 0 0 0 0 0  PHQ- 9 Score 0          Fall Risk    03/24/2022    2:42 PM 12/05/2021    3:12 PM 03/14/2021    9:57 AM 03/10/2021   11:35 AM 02/10/2021    3:11 PM  [place] in the past year? 0 0 0 0 0  Number falls in past yr: 0 0   0  Injury with Fall? 0 0   0  Risk for fall due to : Medication side effect No Fall Risks No Fall Risks  No Fall Risks  Follow up Falls prevention  discussed;Education provided;Falls evaluation completed Falls evaluation completed Education provided;Falls prevention discussed  Falls evaluation completed    FALL RISK PREVENTION PERTAINING TO THE HOME:  Any stairs in or around the home? Yes  If so, are there any without handrails? No  Home free of loose throw rugs in walkways, pet beds, electrical cords, etc? Yes  Adequate lighting in your home to reduce risk of falls? Yes   ASSISTIVE DEVICES UTILIZED TO PREVENT FALLS:  Life alert? No  Use of a cane, walker or w/c? No  Grab bars in the bathroom? Yes  Shower chair or bench in shower? Yes  Elevated toilet seat or a handicapped toilet? No   TIMED UP AND GO:  Was the test performed? No .       Cognitive Function:        03/24/2022    2:44 PM 03/14/2021    9:58 AM 07/06/2018    8:04 AM 03/30/2017    8:57 AM  6CIT Screen  What Year? 0 points 0 points 0 points 0 points  What month? 0 points 0 points 0 points 0 points  What time? 0 points 0 points 0 points 0 points  Count back from 20 0 points 0 points 0 points 0 points  Months in reverse 0 points 0 points 0 points 0 points  Repeat phrase 2 points 0 points 0 points 0 points  Total Score 2 points 0 points 0 points 0 points    Immunizations Immunization History  Administered Date(s) Administered   Fluad Quad(high Dose 65+) 10/06/2021   Influenza Split 11/11/2013, 10/27/2014, 10/31/2015, 10/26/2016, 11/10/2017   Influenza, High Dose Seasonal PF 10/23/2018, 10/26/2020   Influenza-Unspecified 11/11/2013,  10/27/2014, 10/31/2015, 10/26/2016, 11/10/2017   Moderna Covid-19 Vaccine Bivalent Booster 76yr & up 03/14/2021   Moderna Sars-Covid-2 Vaccination 03/13/2019, 04/11/2019   Pneumococcal Conjugate-13 03/30/2017   Zoster Recombinat (Shingrix) 03/14/2021    TDAP status: Due, Education has been provided regarding the importance of this vaccine. Advised may receive this vaccine at local pharmacy or Health Dept. Aware to provide a  copy of the vaccination record if obtained from local pharmacy or Health Dept. Verbalized acceptance and understanding.  Flu Vaccine status: Up to date  Pneumococcal vaccine status: Up to date  Covid-19 vaccine status: Completed vaccines  Qualifies for Shingles Vaccine? Yes   Zostavax completed No   Shingrix Completed?: needs second dose  Screening Tests Health Maintenance  Topic Date Due   DTaP/Tdap/Td (1 - Tdap) Never done   Pneumonia Vaccine 77 Years old (2 of 2 - PPSV23 or PCV20) 05/25/2017   Zoster Vaccines- Shingrix (2 of 2) 05/09/2021   COVID-19 Vaccine (4 - 2023-24 season) 09/26/2021   Medicare Annual Wellness (AWV)  03/14/2022   INFLUENZA VACCINE  Completed   Hepatitis C Screening  Completed   HPV VACCINES  Aged Out   COLONOSCOPY (Pts 45-467yrInsurance coverage will need to be confirmed)  Discontinued    Health Maintenance  Health Maintenance Due  Topic Date Due   DTaP/Tdap/Td (1 - Tdap) Never done   Pneumonia Vaccine 6589Years old (2 of 2 - PPSV23 or PCV20) 05/25/2017   Zoster Vaccines- Shingrix (2 of 2) 05/09/2021   COVID-19 Vaccine (4 - 2023-24 season) 09/26/2021   Medicare Annual Wellness (AWV)  03/14/2022    Colorectal cancer screening: Type of screening: Colonoscopy. Completed 02/03/2021. Repeat every 3 years  Lung Cancer Screening: (Low Dose CT Chest recommended if Age 77-80ears, 30 pack-year currently smoking OR have quit w/in 15years.) does not qualify.   Lung Cancer Screening Referral: no  Additional Screening:  Hepatitis C Screening: does qualify; Completed 04/07/2018  Vision Screening: Recommended annual ophthalmology exams for early detection of glaucoma and other disorders of the eye. Is the patient up to date with their annual eye exam?  Yes  Who is the provider or what is the name of the office in which the patient attends annual eye exams? Dr. WoClydene Lamingf pt is not established with a provider, would they like to be referred to a provider to  establish care? No .   Dental Screening: Recommended annual dental exams for proper oral hygiene  Community Resource Referral / Chronic Care Management: CRR required this visit?  No   CCM required this visit?  No      Plan:     I have personally reviewed and noted the following in the patient's chart:   Medical and social history Use of alcohol, tobacco or illicit drugs  Current medications and supplements including opioid prescriptions. Patient is not currently taking opioid prescriptions. Functional ability and status Nutritional status Physical activity Advanced directives List of other physicians Hospitalizations, surgeries, and ER visits in previous 12 months Vitals Screenings to include cognitive, depression, and falls Referrals and appointments  In addition, I have reviewed and discussed with patient certain preventive protocols, quality metrics, and best practice recommendations. A written personalized care plan for preventive services as well as general preventive health recommendations were provided to patient.     NiKellie SimmeringLPN   [id number] Nurse Notes: none  Due to this being a virtual visit, the after visit summary with patients personalized plan was offered  to patient via mail or my-chart. Patient would like to access on my-chart

## 2022-03-24 NOTE — Patient Instructions (Signed)
Tyler Wu , Thank you for taking time to come for your Medicare Wellness Visit. I appreciate your ongoing commitment to your health goals. Please review the following plan we discussed and let me know if I can assist you in the future.   These are the goals we discussed:  Goals      Exercise 3x per week (30 min per time)     Patient would like to keep exercising      Patient Stated     03/14/2021, maintain current health and stay active     Patient Stated     03/24/2022, maintain current health and stay healthy        This is a list of the screening recommended for you and due dates:  Health Maintenance  Topic Date Due   DTaP/Tdap/Td vaccine (1 - Tdap) Never done   Pneumonia Vaccine (2 of 2 - PPSV23 or PCV20) 05/25/2017   Zoster (Shingles) Vaccine (2 of 2) 05/09/2021   COVID-19 Vaccine (4 - 2023-24 season) 09/26/2021   Medicare Annual Wellness Visit  03/25/2023   Flu Shot  Completed   Hepatitis C Screening: USPSTF Recommendation to screen - Ages 18-79 yo.  Completed   HPV Vaccine  Aged Out   Colon Cancer Screening  Discontinued    Advanced directives: Please bring a copy of your POA (Power of Attorney) and/or Living Will to your next appointment.   Conditions/risks identified: none  Next appointment: Follow up in one year for your annual wellness visit.   Preventive Care 52 Years and Older, Male  Preventive care refers to lifestyle choices and visits with your health care provider that can promote health and wellness. What does preventive care include? A yearly physical exam. This is also called an annual well check. Dental exams once or twice a year. Routine eye exams. Ask your health care provider how often you should have your eyes checked. Personal lifestyle choices, including: Daily care of your teeth and gums. Regular physical activity. Eating a healthy diet. Avoiding tobacco and drug use. Limiting alcohol use. Practicing safe sex. Taking low doses of aspirin  every day. Taking vitamin and mineral supplements as recommended by your health care provider. What happens during an annual well check? The services and screenings done by your health care provider during your annual well check will depend on your age, overall health, lifestyle risk factors, and family history of disease. Counseling  Your health care provider may ask you questions about your: Alcohol use. Tobacco use. Drug use. Emotional well-being. Home and relationship well-being. Sexual activity. Eating habits. History of falls. Memory and ability to understand (cognition). Work and work Statistician. Screening  You may have the following tests or measurements: Height, weight, and BMI. Blood pressure. Lipid and cholesterol levels. These may be checked every 5 years, or more frequently if you are over 49 years old. Skin check. Lung cancer screening. You may have this screening every year starting at age 64 if you have a 30-pack-year history of smoking and currently smoke or have quit within the past 15 years. Fecal occult blood test (FOBT) of the stool. You may have this test every year starting at age 61. Flexible sigmoidoscopy or colonoscopy. You may have a sigmoidoscopy every 5 years or a colonoscopy every 10 years starting at age 71. Prostate cancer screening. Recommendations will vary depending on your family history and other risks. Hepatitis C blood test. Hepatitis B blood test. Sexually transmitted disease (STD) testing. Diabetes screening. This is  done by checking your blood sugar (glucose) after you have not eaten for a while (fasting). You may have this done every 1-3 years. Abdominal aortic aneurysm (AAA) screening. You may need this if you are a current or former smoker. Osteoporosis. You may be screened starting at age 55 if you are at high risk. Talk with your health care provider about your test results, treatment options, and if necessary, the need for more  tests. Vaccines  Your health care provider may recommend certain vaccines, such as: Influenza vaccine. This is recommended every year. Tetanus, diphtheria, and acellular pertussis (Tdap, Td) vaccine. You may need a Td booster every 10 years. Zoster vaccine. You may need this after age 50. Pneumococcal 13-valent conjugate (PCV13) vaccine. One dose is recommended after age 84. Pneumococcal polysaccharide (PPSV23) vaccine. One dose is recommended after age 31. Talk to your health care provider about which screenings and vaccines you need and how often you need them. This information is not intended to replace advice given to you by your health care provider. Make sure you discuss any questions you have with your health care provider. Document Released: 02/08/2015 Document Revised: 10/02/2015 Document Reviewed: 11/13/2014 Elsevier Interactive Patient Education  2017 [place] Prevention in the Home Falls can cause injuries. They can happen to people of all ages. There are many things you can do to make your home safe and to help prevent falls. What can I do on the outside of my home? Regularly fix the edges of walkways and driveways and fix any cracks. Remove anything that might make you trip as you walk through a door, such as a raised step or threshold. Trim any bushes or trees on the path to your home. Use bright outdoor lighting. Clear any walking paths of anything that might make someone trip, such as rocks or tools. Regularly check to see if handrails are loose or broken. Make sure that both sides of any steps have handrails. Any raised decks and porches should have guardrails on the edges. Have any leaves, snow, or ice cleared regularly. Use sand or salt on walking paths during winter. Clean up any spills in your garage right away. This includes oil or grease spills. What can I do in the bathroom? Use night lights. Install grab bars by the toilet and in the tub and shower.  Do not use towel bars as grab bars. Use non-skid mats or decals in the tub or shower. If you need to sit down in the shower, use a plastic, non-slip stool. Keep the floor dry. Clean up any water that spills on the floor as soon as it happens. Remove soap buildup in the tub or shower regularly. Attach bath mats securely with double-sided non-slip rug tape. Do not have throw rugs and other things on the floor that can make you trip. What can I do in the bedroom? Use night lights. Make sure that you have a light by your bed that is easy to reach. Do not use any sheets or blankets that are too big for your bed. They should not hang down onto the floor. Have a firm chair that has side arms. You can use this for support while you get dressed. Do not have throw rugs and other things on the floor that can make you trip. What can I do in the kitchen? Clean up any spills right away. Avoid walking on wet floors. Keep items that you use a lot in easy-to-reach places. If you need  to reach something above you, use a strong step stool that has a grab bar. Keep electrical cords out of the way. Do not use floor polish or wax that makes floors slippery. If you must use wax, use non-skid floor wax. Do not have throw rugs and other things on the floor that can make you trip. What can I do with my stairs? Do not leave any items on the stairs. Make sure that there are handrails on both sides of the stairs and use them. Fix handrails that are broken or loose. Make sure that handrails are as long as the stairways. Check any carpeting to make sure that it is firmly attached to the stairs. Fix any carpet that is loose or worn. Avoid having throw rugs at the top or bottom of the stairs. If you do have throw rugs, attach them to the floor with carpet tape. Make sure that you have a light switch at the top of the stairs and the bottom of the stairs. If you do not have them, ask someone to add them for you. What else  can I do to help prevent falls? Wear shoes that: Do not have high heels. Have rubber bottoms. Are comfortable and fit you well. Are closed at the toe. Do not wear sandals. If you use a stepladder: Make sure that it is fully opened. Do not climb a closed stepladder. Make sure that both sides of the stepladder are locked into place. Ask someone to hold it for you, if possible. Clearly mark and make sure that you can see: Any grab bars or handrails. First and last steps. Where the edge of each step is. Use tools that help you move around (mobility aids) if they are needed. These include: Canes. Walkers. Scooters. Crutches. Turn on the lights when you go into a dark area. Replace any light bulbs as soon as they burn out. Set up your furniture so you have a clear path. Avoid moving your furniture around. If any of your floors are uneven, fix them. If there are any pets around you, be aware of where they are. Review your medicines with your doctor. Some medicines can make you feel dizzy. This can increase your chance of falling. Ask your doctor what other things that you can do to help prevent falls. This information is not intended to replace advice given to you by your health care provider. Make sure you discuss any questions you have with your health care provider. Document Released: 11/08/2008 Document Revised: 06/20/2015 Document Reviewed: 02/16/2014 Elsevier Interactive Patient Education  2017 [employer].

## 2022-03-27 ENCOUNTER — Ambulatory Visit: Payer: Medicare HMO | Admitting: Medical

## 2022-04-09 DIAGNOSIS — H524 Presbyopia: Secondary | ICD-10-CM | POA: Diagnosis not present

## 2022-05-18 DIAGNOSIS — D225 Melanocytic nevi of trunk: Secondary | ICD-10-CM | POA: Diagnosis not present

## 2022-05-18 DIAGNOSIS — L821 Other seborrheic keratosis: Secondary | ICD-10-CM | POA: Diagnosis not present

## 2022-05-18 DIAGNOSIS — L814 Other melanin hyperpigmentation: Secondary | ICD-10-CM | POA: Diagnosis not present

## 2022-05-23 ENCOUNTER — Other Ambulatory Visit: Payer: Self-pay

## 2022-05-23 ENCOUNTER — Ambulatory Visit
Admission: EM | Admit: 2022-05-23 | Discharge: 2022-05-23 | Disposition: A | Discharge status: Home / Self Care | Payer: Medicare HMO | Attending: Emergency Medicine | Admitting: Emergency Medicine

## 2022-05-23 ENCOUNTER — Encounter: Payer: Self-pay | Admitting: *Deleted

## 2022-05-23 DIAGNOSIS — R030 Elevated blood-pressure reading, without diagnosis of hypertension: Secondary | ICD-10-CM | POA: Diagnosis not present

## 2022-05-23 NOTE — Discharge Instructions (Signed)
EKG shows normal heart rate and paced, slightly slower with heart rate in the 53 however when compared to an EKG completed in 2020, heart rhythm and rate is the same  On exam normal heart heart sounds are heard  Unknown cause for blood pressure elevation however multiple readings are needed to determine if this is a consistent finding  Please check blood pressure only once daily when you are in a calm quiet environment and feeling well  Keep upcoming appointment with primary care on Monday for reevaluation  At any point if you begin to have worsening symptoms such as severe dizziness, lightheadedness, headaches, visual changes, chest pain or shortness of breath and your blood pressure is elevated please go to the nearest emergency department for a full cardiac workup

## 2022-05-23 NOTE — ED Triage Notes (Signed)
Pt reports he woke this AM and had vertigo pt reports he took PRN  for . Pt checked his BP at home Pt reports 181 /102 . Pt does not take meds for BP.

## 2022-05-23 NOTE — ED Provider Notes (Signed)
[name]    CSN: [id number] Arrival date & time: 05/23/22  1258      History   Chief Complaint Chief Complaint  Patient presents with   Dizziness   Hypertension    HPI Tyler Wu is a 77 y.o. male.   Patient presents for evaluation of elevated blood pressure reading this morning, peaking at 181/102.  Had a mild occurrence of dizziness, described as imbalance upon awakening, history of vertigo and tinnitus, took dosage of meclizine which helped to reduce symptoms.  Denies cardiac history, endorses that he has been told that he had mild arthrosclerosis but does not currently take a statin and levels are monitored.  Denies shortness of breath, chest pain or tightness, visual changes, lightheadedness or syncope.  Has upcoming PCP appointment on Monday.  Past Medical History:  Diagnosis Date   Allergy    Aortic atherosclerosis (HCC) 10/2019   per CT chest   BPH (benign prostatic hyperplasia)    CLL (chronic lymphocytic leukemia) (HCC) 2005   followed by oncology yearly.   History of COVID-19 2021    Patient Active Problem List   Diagnosis Date Noted   Benign prostatic hyperplasia with urinary frequency 12/05/2021   Thrombocytopenia (HCC) 11/25/2020   Injury of nail bed of finger 11/25/2020   Vitamin D deficiency 08/26/2020   Encounter for health maintenance examination in adult 05/02/2020   Medicare annual wellness visit, subsequent 05/02/2020   Screening for lipid disorders 05/02/2020   Vaccine counseling 05/02/2020   Vitamin deficiency 05/02/2020   Screen for colon cancer 05/02/2020   Advanced directives, counseling/discussion 05/02/2020   Frequent urination 03/08/2020   Enlarged prostate 03/08/2020   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   Urinary urgency 03/08/2020   Fatigue 11/17/2019   Sinus pressure 11/13/2019   History of COVID-19 11/13/2019   Loss of smell 11/13/2019   COVID-19 virus infection 11/03/2019   SOB (shortness of breath)  11/03/2019   Aortic atherosclerosis (HCC) 11/03/2019   History of skin cancer 11/24/2016   Lymphadenopathy of head and neck 11/22/2014   Multiple skin nodules 11/22/2013   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 Authorizing Provider  amoxicillin-clavulanate (AUGMENTIN) 875-125 MG tablet Take 1 tablet by mouth every 12 (twelve) hours. 01/15/22   Mardella Layman, MD  aspirin 81 MG chewable tablet Chew 81 mg by mouth.    [provider]  cetirizine (ZYRTEC ALLERGY) 10 MG tablet Take 1 tablet (10 mg total) by mouth at bedtime. 11/30/21 05/29/22  Theadora Rama Scales, PA-C  fluticasone (FLONASE) 50 MCG/ACT nasal spray Place 1 spray into both nostrils daily. Begin by using 2 sprays in each nare daily for 3 to 5 days, then decrease to 1 spray in each nare daily. 11/30/21   Theadora Rama Scales, PA-C  meclizine (ANTIVERT) 12.5 MG tablet Take 1 tablet (12.5 mg total) by mouth 3 (three) times daily as needed for dizziness. 03/17/22   Ellsworth Lennox, PA-C  tamsulosin (FLOMAX) 0.4 MG CAPS capsule TAKE 1 CAPSULE(0.4 MG) BY MOUTH DAILY AFTER SUPPER 12/05/21   Tysinger, Kermit Balo, PA-C    Family History Family History  Problem Relation Age of Onset   Cancer Father 69  melonoma   Heart attack Maternal Grandmother    Hypertension Maternal Grandmother    Heart attack Maternal Grandfather    Hypertension Maternal Grandfather    Kidney disease Brother     Social History Social History   Tobacco Use   Smoking status: Former    Types: Cigarettes    Quit date: 01/26/1974    Years since quitting: 48.3    Passive exposure: Past   Smokeless tobacco: Never  Vaping Use   Vaping Use: Never used  Substance Use Topics   Alcohol use: No   Drug  use: No     Allergies   Levofloxacin   Review of Systems Review of Systems  Neurological:  Positive for dizziness.     Physical Exam Triage Vital Signs ED Triage Vitals  Enc Vitals Group     BP 05/23/22 1345 (!) 185/81     Pulse Rate 05/23/22 1345 70     Resp 05/23/22 1345 18     Temp 05/23/22 1345 97.6 F (36.4 C)     Temp src --      SpO2 05/23/22 1345 (!) 86 %     Weight --      Height --      Head Circumference --      Peak Flow --      Pain Score 05/23/22 1343 0     Pain Loc --      Pain Edu? --      Excl. in GC? --    No data found.  Updated Vital Signs BP (!) 185/81   Pulse 70   Temp 97.6 F (36.4 C)   Resp 18   SpO2 (!) 86%   Visual Acuity Right Eye Distance:   Left Eye Distance:   Bilateral Distance:    Right Eye Near:   Left Eye Near:    Bilateral Near:     Physical Exam Constitutional:      Appearance: Normal appearance.  Eyes:     Extraocular Movements: Extraocular movements intact.  Cardiovascular:     Rate and Rhythm: Normal rate and regular rhythm.     Pulses: Normal pulses.     Heart sounds: Normal heart sounds.  Pulmonary:     Effort: Pulmonary effort is normal.     Breath sounds: Normal breath sounds.  Neurological:     Mental Status: He is alert and oriented to person, place, and time. Mental status is at baseline.      UC Treatments / Results  Labs (all labs ordered are listed, but only abnormal results are displayed) Labs Reviewed - No data to display  EKG   Radiology No results found.  Procedures Procedures (including critical care time)  Medications Ordered in UC Medications - No data to display  Initial Impression / Assessment and Plan / UC Course  I have reviewed the triage vital signs and the nursing notes.  Pertinent labs & imaging results that were available during my care of the patient were reviewed by me and considered in my medical decision making (see chart for details).  Elevated blood  pressure reading in office without diagnosis of hypertension  Blood pressure in triage 185/81, reduced to 172/73 on reevaluation, EKG shows sinus bradycardia with a heart rate of 53, in comparison to 2020 EKG no change, discussed this with patient, does not have cardiac history, low risk, stable for outpatient management, advised to monitor closely advised to recheck blood pressure in the morning and to keep upcoming  appointment with PCP, if symptoms worsen in or he begins to have new symptoms prior to appointment he is to go to the nearest emergency department for a full workup, verbalized understanding  Final Clinical Impressions(s) / UC Diagnoses   Final diagnoses:  Elevated blood pressure reading in office with Ruby Logiudice coat syndrome, without diagnosis of hypertension     Discharge Instructions      EKG shows normal heart rate and paced, slightly slower with heart rate in the 53 however when compared to an EKG completed in 2020, heart rhythm and rate is the same  On exam normal heart heart sounds are heard  Unknown cause for blood pressure elevation however multiple readings are needed to determine if this is a consistent finding  Please check blood pressure only once daily when you are in a calm quiet environment and feeling well  Keep upcoming appointment with primary care on Monday for reevaluation  At any point if you begin to have worsening symptoms such as severe dizziness, lightheadedness, headaches, visual changes, chest pain or shortness of breath and your blood pressure is elevated please go to the nearest emergency department for a full cardiac workup   ED Prescriptions   None    PDMP not reviewed this encounter.   Valinda Hoar, NP 05/23/22 1419

## 2022-05-25 ENCOUNTER — Other Ambulatory Visit: Payer: Self-pay | Admitting: Medical

## 2022-05-25 ENCOUNTER — Encounter: Payer: Self-pay | Admitting: Medical

## 2022-05-25 ENCOUNTER — Ambulatory Visit (INDEPENDENT_AMBULATORY_CARE_PROVIDER_SITE_OTHER): Payer: Medicare HMO | Admitting: Medical

## 2022-05-25 VITALS — BP 150/70 | HR 72 | Ht 67.25 in | Wt 153.4 lb

## 2022-05-25 DIAGNOSIS — N400 Enlarged prostate without lower urinary tract symptoms: Secondary | ICD-10-CM | POA: Diagnosis not present

## 2022-05-25 DIAGNOSIS — N401 Enlarged prostate with lower urinary tract symptoms: Secondary | ICD-10-CM | POA: Diagnosis not present

## 2022-05-25 DIAGNOSIS — Z23 Encounter for immunization: Secondary | ICD-10-CM

## 2022-05-25 DIAGNOSIS — E559 Vitamin D deficiency, unspecified: Secondary | ICD-10-CM

## 2022-05-25 DIAGNOSIS — Z13220 Encounter for screening for lipoid disorders: Secondary | ICD-10-CM | POA: Diagnosis not present

## 2022-05-25 DIAGNOSIS — Z0000 Encounter for general adult medical examination without abnormal findings: Secondary | ICD-10-CM | POA: Diagnosis not present

## 2022-05-25 DIAGNOSIS — Z7185 Encounter for immunization safety counseling: Secondary | ICD-10-CM

## 2022-05-25 DIAGNOSIS — Z7189 Other specified counseling: Secondary | ICD-10-CM

## 2022-05-25 DIAGNOSIS — I10 Essential (primary) hypertension: Secondary | ICD-10-CM

## 2022-05-25 DIAGNOSIS — C9110 Chronic lymphocytic leukemia of B-cell type not having achieved remission: Secondary | ICD-10-CM | POA: Diagnosis not present

## 2022-05-25 DIAGNOSIS — R350 Frequency of micturition: Secondary | ICD-10-CM

## 2022-05-25 DIAGNOSIS — I700 Atherosclerosis of aorta: Secondary | ICD-10-CM

## 2022-05-25 LAB — POCT URINALYSIS DIP (PROADVANTAGE DEVICE)
Bilirubin, UA: NEGATIVE
Glucose, UA: NEGATIVE mg/dL
Ketones, POC UA: NEGATIVE mg/dL
Leukocytes, UA: NEGATIVE
Nitrite, UA: NEGATIVE
Protein Ur, POC: NEGATIVE mg/dL
Specific Gravity, Urine: 1.01
Urobilinogen, Ur: NEGATIVE
pH, UA: 6 (ref 5.0–8.0)

## 2022-05-25 MED ORDER — VALSARTAN 40 MG PO TABS: 40.0000 mg | ORAL_TABLET | Freq: Every day | ORAL | 1 refills | Status: DC

## 2022-05-25 NOTE — Progress Notes (Signed)
Subjective:   HPI  Tyler Wu is a 77 y.o. male who presents for Chief Complaint  Patient presents with   fasting cpe     Fasting cpe, seen ER for vertigo and elevated HTN the other day. Here for follow-up. [facility] has no record of Pneumonia, Tetanus or 2nd shingles    Patient Care Team: Shivali Quackenbush, Kermit Balo, PA-C as PCP - General (Family Medicine) Artis Delay, MD as Consulting Physician (Hematology and Oncology) [facility]   Concerns: Recently BP elevated, but no chest pain or palpitations.  No prior medication for BP.  Home BP readings elevated as well.   Reviewed their medical, surgical, family, social, medication, and allergy history and updated chart as appropriate.  Allergies  Allergen Reactions   Levofloxacin Other (See Comments)    Bad dreams     Past Medical History:  Diagnosis Date   Allergy    Aortic atherosclerosis (HCC) 10/2019   per CT chest   BPH (benign prostatic hyperplasia)    CLL (chronic lymphocytic leukemia) (HCC) 2005   followed by oncology yearly.   History of COVID-19 2021      Current Outpatient Medications:    aspirin 81 MG chewable tablet, Chew 81 mg by mouth., Disp: , Rfl:    cetirizine (ZYRTEC ALLERGY) 10 MG tablet, Take 1 tablet (10 mg total) by mouth at bedtime., Disp: 90 tablet, Rfl: 1   fluticasone (FLONASE) 50 MCG/ACT nasal spray, Place 1 spray into both nostrils daily. Begin by using 2 sprays in each nare daily for 3 to 5 days, then decrease to 1 spray in each nare daily., Disp: 15.8 mL, Rfl: 2   tamsulosin (FLOMAX) 0.4 MG CAPS capsule, TAKE 1 CAPSULE(0.4 MG) BY MOUTH DAILY AFTER SUPPER, Disp: 90 capsule, Rfl: 3   valsartan (DIOVAN) 40 MG tablet, Take 1 tablet (40 mg total) by mouth daily., Disp: 30 tablet, Rfl: 1  Family History  Problem Relation Age of Onset   Cancer Father 39       melonoma   Heart attack Maternal Grandmother    Hypertension Maternal Grandmother    Heart attack Maternal Grandfather     Hypertension Maternal Grandfather    Kidney disease Brother     Past Surgical History:  Procedure Laterality Date   COLONOSCOPY     around 2012   HERNIA REPAIR  1996   Review of Systems  Constitutional:  Negative for chills, fever, malaise/fatigue and weight loss.  HENT:  Negative for congestion, ear pain, hearing loss, sore throat and tinnitus.   Eyes:  Negative for blurred vision, pain and redness.  Respiratory:  Negative for cough, hemoptysis and shortness of breath.   Cardiovascular:  Negative for chest pain, palpitations, orthopnea, claudication and leg swelling.  Gastrointestinal:  Negative for abdominal pain, blood in stool, constipation, diarrhea, nausea and vomiting.  Genitourinary:  Negative for dysuria, flank pain, frequency, hematuria and urgency.  Musculoskeletal:  Negative for falls, joint pain and myalgias.  Skin:  Negative for itching and rash.  Neurological:  Negative for dizziness, tingling, speech change, weakness and headaches.  Endo/Heme/Allergies:  Negative for polydipsia. Does not bruise/bleed easily.  Psychiatric/Behavioral:  Negative for depression and memory loss. The patient is not nervous/anxious and does not have insomnia.      Objective:  BP (!) 150/70   Pulse 72   Ht 5' 7.25" (1.708 m)   Wt 153 lb 6.4 oz (69.6 kg)   SpO2 99%   BMI 23.85 kg/m   General appearance:  alert, no distress, WD/WN, Caucasian male Skin: scattered macules, no worrisome lesions, scattered soft tissue lumps chronic of bilat arms HEENT: normocephalic, conjunctiva/corneas normal, sclerae anicteric, PERRLA, EOMi, nares patent, no discharge or erythema, pharynx normal Oral cavity: MMM, tongue normal, teeth normal Neck: supple, no lymphadenopathy, no thyromegaly, no masses, normal ROM, no bruits Chest: non tender, normal shape and expansion Heart: RRR, normal S1, S2, no murmurs Lungs: CTA bilaterally, no wheezes, rhonchi, or rales Abdomen: +bs, soft, non tender, non distended,  no masses, no hepatomegaly, no splenomegaly, no bruits Back: non tender, normal ROM, no scoliosis Musculoskeletal: upper extremities non tender, no obvious deformity, normal ROM throughout, lower extremities non tender, no obvious deformity, normal ROM throughout Extremities: mild varicose veins of bilat lower legs, otherwise no edema, no cyanosis, no clubbing Pulses: 2+ symmetric, upper and lower extremities, normal cap refill Neurological: alert, oriented x 3, CN2-12 intact, strength normal upper extremities and lower extremities, sensation normal throughout, DTRs 2+ throughout, no cerebellar signs, gait normal Psychiatric: normal affect, behavior normal, pleasant  GU/rectal - declined    Assessment and Plan :   Encounter Diagnoses  Name Primary?   Encounter for health maintenance examination in adult Yes   Vitamin D deficiency    Vaccine counseling    Screening for lipid disorders    Enlarged prostate    CLL (chronic lymphocytic leukemia) (HCC)    Benign prostatic hyperplasia with urinary frequency    Advanced directives, counseling/discussion    Aortic atherosclerosis (HCC)    Essential hypertension, benign    Need for pneumococcal 20-valent conjugate vaccination     This visit was a preventative care visit, also known as wellness visit or routine physical.   Topics typically include healthy lifestyle, diet, exercise, preventative care, vaccinations, sick and well care, proper use of emergency dept and after hours care, as well as other concerns.     Separate significant issues discussed: Hypertension - new onset.  Discussed risks of uncontrolled BP.  Begin trial of Valsartan, discussed risks, benefits and proper use of medication  Aortic atherosclerosis - discussed statins, diet, preventative measures.  He doesn't want to start statin at same time as the BP medication.   General Recommendations: Continue to return yearly for your annual wellness and preventative care  visits.  This gives [place] a chance to discuss healthy lifestyle, exercise, vaccinations, review your chart record, and perform screenings where appropriate.  I recommend you see your eye doctor yearly for routine vision care.  I recommend you see your dentist yearly for routine dental care including hygiene visits twice yearly.   Vaccination  Immunization History  Administered Date(s) Administered   Fluad Quad(high Dose 65+) 10/06/2021   Influenza Split 11/11/2013, 10/27/2014, 10/31/2015, 10/26/2016, 11/10/2017   Influenza, High Dose Seasonal PF 10/23/2018, 10/26/2020   Influenza-Unspecified 11/11/2013, 10/27/2014, 10/31/2015, 10/26/2016, 11/10/2017   Moderna Covid-19 Vaccine Bivalent Booster 53yrs & up 03/14/2021   Moderna Sars-Covid-2 Vaccination 03/13/2019, 04/11/2019   PNEUMOCOCCAL CONJUGATE-20 05/25/2022   Pneumococcal Conjugate-13 03/30/2017   Zoster Recombinat (Shingrix) 03/14/2021    Vaccine recommendations: Counseled on the pneumococcal vaccine.  Vaccine information sheet given.  Pneumococcal vaccine Prevnar 20 given after consent obtained.   Vaccines administered today: Shingrix Tdap Pneumococcal 20   Screening for cancer: Colon cancer screening: 2023   Prostate Cancer screening: The recommended prostate cancer screening test is a blood test called the prostate-specific antigen (PSA) test. PSA is a protein that is made in the prostate. As you age, your prostate naturally produces more  PSA. Abnormally high PSA levels may be caused by: Prostate cancer. An enlarged prostate that is not caused by cancer (benign prostatic hyperplasia, or BPH). This condition is very common in older men. A prostate gland infection (prostatitis) or urinary tract infection. Certain medicines such as male hormones (like testosterone) or other medicines that raise testosterone levels. A rectal exam may be done as part of prostate cancer screening to help provide information about the size of  your prostate gland. When a rectal exam is performed, it should be done after the PSA level is drawn to avoid any effect on the results.   Skin cancer screening: Check your skin regularly for new changes, growing lesions, or other lesions of concern Come in for evaluation if you have skin lesions of concern.   Lung cancer screening: If you have a greater than 20 pack year history of tobacco use, then you may qualify for lung cancer screening with a chest CT scan.   Please call your insurance company to inquire about coverage for this test.   Pancreatic cancer:  no current screening test is available or routinely recommended. (risk factors: smoking, overweight or obese, diabetes, chronic pancreatitis, work exposure - dry cleaning, metal working, 77yo>, M>F, Tree surgeon, family hx/o, hereditary breast, ovarian, melanoma, lynch, peutz-jeghers).  Symptoms: jaundice, dark urine, light color or greasy stools, itchy skin, belly or back pain, weight loss, poor appetite, nausea, vomiting, liver enlargement, DVT/blood clots.   We currently don't have screenings for other cancers besides breast, cervical, colon, and lung cancers.  If you have a strong family history of cancer or have other cancer screening concerns, please let me know.  Genetic testing referral is an option for individuals with high cancer risk in the family.  There are some other cancer screenings in development currently.   Bone health: Get at least 150 minutes of aerobic exercise weekly Get weight bearing exercise at least once weekly Bone density test:  A bone density test is an imaging test that uses a type of X-ray to measure the amount of calcium and other minerals in your bones. The test may be used to diagnose or screen you for a condition that causes weak or thin bones (osteoporosis), predict your risk for a broken bone (fracture), or determine how well your osteoporosis treatment is working. The bone density test is  recommended for females 65 and older, or females or males <65 if certain risk factors such as thyroid disease, long term use of steroids such as for asthma or rheumatological issues, vitamin D deficiency, estrogen deficiency, family history of osteoporosis, self or family history of fragility fracture in first degree relative.    Heart health: Get at least 150 minutes of aerobic exercise weekly Limit alcohol It is important to maintain a healthy blood pressure and healthy cholesterol numbers  Heart disease screening: Screening for heart disease includes screening for blood pressure, fasting lipids, glucose/diabetes screening, BMI height to weight ratio, reviewed of smoking status, physical activity, and diet.    Goals include blood pressure 120/80 or less, maintaining a healthy lipid/cholesterol profile, preventing diabetes or keeping diabetes numbers under good control, not smoking or using tobacco products, exercising most days per week or at least 150 minutes per week of exercise, and eating healthy variety of fruits and vegetables, healthy oils, and avoiding unhealthy food choices like fried food, fast food, high sugar and high cholesterol foods.    Other tests may possibly include EKG test, CT coronary calcium score, echocardiogram, exercise  treadmill stress test.       Vascular disease screening: For higher risk individuals including smokers, diabetics, patients with known heart disease or high blood pressure, kidney disease, and others, screening for vascular disease or atherosclerosis of the arteries is available.  Examples may include carotid ultrasound, abdominal aortic ultrasound, ABI blood flow screening in the legs, thoracic aorta screening.   Medical care options: I recommend you continue to seek care here first for routine care.  We try really hard to have available appointments Monday through Friday daytime hours for sick visits, acute visits, and physicals.  Urgent care should  be used for after hours and weekends for significant issues that cannot wait till the next day.  The emergency department should be used for significant potentially life-threatening emergencies.  The emergency department is expensive, can often have long wait times for less significant concerns, so try to utilize primary care, urgent care, or telemedicine when possible to avoid unnecessary trips to the emergency department.  Virtual visits and telemedicine have been introduced since the pandemic started in 2020, and can be convenient ways to receive medical care.  We offer virtual appointments as well to assist you in a variety of options to seek medical care.   Legal  Take the time to do a last will and testament, Advanced Directives including Health Care Power of Attorney and Living Will documents.  Don't leave your family with burdens that can be handled ahead of time.   Advanced Directives: I recommend you consider completing a Health Care Power of Attorney and Living Will.   These documents respect your wishes and help alleviate burdens on your loved ones if you were to become terminally ill or be in a position to need those documents enforced.    You can complete Advanced Directives yourself, have them notarized, then have copies made for our office, for you and for anybody you feel should have them in safe keeping.  Or, you can have an attorney prepare these documents.   If you haven't updated your Last Will and Testament in a while, it may be worthwhile having an attorney prepare these documents together and save on some costs.       Spiritual and Emotional Health Keeping a healthy spiritual life can help you better manage your physical health. Your spiritual life can help you to cope with any issues that may arise with your physical health.  Balance can keep [place] healthy and help [place] to recover.  If you are struggling with your spiritual health there are questions that you may want to ask  yourself:  What makes me feel most complete? When do I feel most connected to the rest of the world? Where do I find the most inner strength? What am I doing when I feel whole?  Helpful tips: Being in nature. Some people feel very connected and at peace when they are walking outdoors or are outside. Helping others. Some feel the largest sense of wellbeing when they are of service to others. Being of service can take on many forms. It can be doing volunteer work, being kind to strangers, or offering a hand to a friend in need. Gratitude. Some people find they feel the most connected when they remain grateful. They may make lists of all the things they are grateful for or say a thank you out loud for all they have.    Emotional Health Are you in tune with your emotional health?  Check out this link:  http://www.marquez-love.com/    Financial Health Make sure you use a budget for your personal finances Make sure you are insured against risks (health insurance, life insurance, auto insurance, etc) Save more, spend less Set financial goals If you need help in this area, good resources include counseling through [employer] or other community resources, have a meeting with a Social research officer, government, and a good resource is the [employer]    Tyler Wu was seen today for fasting cpe .  Diagnoses and all orders for this visit:  Encounter for health maintenance examination in adult -     Comprehensive metabolic panel -     Lipid panel -     TSH -     POCT Urinalysis DIP (Proadvantage Device) -     VITAMIN D 25 Hydroxy (Vit-D Deficiency, Fractures) -     CBC with Differential/Platelet  Vitamin D deficiency -     VITAMIN D 25 Hydroxy (Vit-D Deficiency, Fractures)  Vaccine counseling  Screening for lipid disorders -     Lipid panel  Enlarged prostate  CLL (chronic lymphocytic leukemia) (HCC)  Benign prostatic hyperplasia with urinary frequency  Advanced  directives, counseling/discussion  Aortic atherosclerosis (HCC)  Essential hypertension, benign  Need for pneumococcal 20-valent conjugate vaccination -     Pneumococcal conjugate vaccine 20-valent  Other orders -     valsartan (DIOVAN) 40 MG tablet; Take 1 tablet (40 mg total) by mouth daily.     Follow-up pending labs, yearly for physical

## 2022-05-27 ENCOUNTER — Other Ambulatory Visit: Payer: Self-pay | Admitting: Medical

## 2022-05-27 LAB — COMPREHENSIVE METABOLIC PANEL
ALT: 17 IU/L (ref 0–44)
AST: 18 IU/L (ref 0–40)
Albumin/Globulin Ratio: 2.7 — ABNORMAL HIGH (ref 1.2–2.2)
Albumin: 4.6 g/dL (ref 3.8–4.8)
Alkaline Phosphatase: 78 IU/L (ref 44–121)
BUN/Creatinine Ratio: 12 (ref 10–24)
BUN: 14 mg/dL (ref 8–27)
Bilirubin Total: 0.5 mg/dL (ref 0.0–1.2)
CO2: 21 mmol/L (ref 20–29)
Calcium: 8.9 mg/dL (ref 8.6–10.2)
Chloride: 107 mmol/L — ABNORMAL HIGH (ref 96–106)
Creatinine, Ser: 1.15 mg/dL (ref 0.76–1.27)
Globulin, Total: 1.7 g/dL (ref 1.5–4.5)
Glucose: 93 mg/dL (ref 70–99)
Potassium: 4.3 mmol/L (ref 3.5–5.2)
Sodium: 145 mmol/L — ABNORMAL HIGH (ref 134–144)
Total Protein: 6.3 g/dL (ref 6.0–8.5)
eGFR: 66 mL/min/{1.73_m2} (ref >59)

## 2022-05-27 LAB — CBC WITH DIFFERENTIAL/PLATELET
Basophils Absolute: 0.1 10*3/uL (ref 0.0–0.2)
Basos: 0 %
EOS (ABSOLUTE): 0.1 10*3/uL (ref 0.0–0.4)
Eos: 1 %
Hematocrit: 43.9 % (ref 37.5–51.0)
Hemoglobin: 14.2 g/dL (ref 13.0–17.7)
Immature Grans (Abs): 0 10*3/uL (ref 0.0–0.1)
Immature Granulocytes: 0 %
Lymphocytes Absolute: 20 10*3/uL — ABNORMAL HIGH (ref 0.7–3.1)
Lymphs: 81 %
MCH: 28.3 pg (ref 26.6–33.0)
MCHC: 32.3 g/dL (ref 31.5–35.7)
MCV: 88 fL (ref 79–97)
Monocytes Absolute: 0.5 10*3/uL (ref 0.1–0.9)
Monocytes: 2 %
Neutrophils Absolute: 3.9 10*3/uL (ref 1.4–7.0)
Neutrophils: 16 %
Platelets: 161 10*3/uL (ref 150–450)
RBC: 5.02 x10E6/uL (ref 4.14–5.80)
RDW: 13.4 % (ref 11.6–15.4)
WBC: 24.6 10*3/uL (ref 3.4–10.8)

## 2022-05-27 LAB — TSH: TSH: 1.83 u[IU]/mL (ref 0.450–4.500)

## 2022-05-27 LAB — LIPID PANEL
Chol/HDL Ratio: 3.2 ratio (ref 0.0–5.0)
Cholesterol, Total: 191 mg/dL (ref 100–199)
HDL: 60 mg/dL (ref >39)
LDL Chol Calc (NIH): 113 mg/dL — ABNORMAL HIGH (ref 0–99)
Triglycerides: 101 mg/dL (ref 0–149)
VLDL Cholesterol Cal: 18 mg/dL (ref 5–40)

## 2022-05-27 LAB — VITAMIN D 25 HYDROXY (VIT D DEFICIENCY, FRACTURES): Vit D, 25-Hydroxy: 23.6 ng/mL — ABNORMAL LOW (ref 30.0–100.0)

## 2022-05-27 MED ORDER — CETIRIZINE HCL 10 MG PO TABS: 10.0000 mg | ORAL_TABLET | Freq: Every day | ORAL | 3 refills | Status: DC

## 2022-05-27 MED ORDER — VITAMIN D (ERGOCALCIFEROL) 1.25 MG (50000 UNIT) PO CAPS: 50000.0000 [IU] | ORAL_CAPSULE | ORAL | 0 refills | Status: DC

## 2022-05-27 MED ORDER — FLUTICASONE PROPIONATE 50 MCG/ACT NA SUSP: 1.0000 | Freq: Every day | NASAL | 2 refills | Status: DC

## 2022-05-27 MED ORDER — VALSARTAN 40 MG PO TABS: 40.0000 mg | ORAL_TABLET | Freq: Every day | ORAL | 3 refills | Status: DC

## 2022-05-27 NOTE — Progress Notes (Signed)
Results sent through MyChart

## 2022-06-26 ENCOUNTER — Encounter: Payer: Self-pay | Admitting: Medical

## 2022-06-26 ENCOUNTER — Ambulatory Visit (INDEPENDENT_AMBULATORY_CARE_PROVIDER_SITE_OTHER): Payer: Medicare HMO | Admitting: Medical

## 2022-06-26 VITALS — BP 156/80 | HR 70 | Temp 97.9°F | Resp 16 | Wt 154.2 lb

## 2022-06-26 DIAGNOSIS — E559 Vitamin D deficiency, unspecified: Secondary | ICD-10-CM

## 2022-06-26 DIAGNOSIS — I700 Atherosclerosis of aorta: Secondary | ICD-10-CM

## 2022-06-26 DIAGNOSIS — I10 Essential (primary) hypertension: Secondary | ICD-10-CM | POA: Diagnosis not present

## 2022-06-26 MED ORDER — VITAMIN D (ERGOCALCIFEROL) 1.25 MG (50000 UNIT) PO CAPS: 50000.0000 [IU] | ORAL_CAPSULE | ORAL | 1 refills | Status: DC

## 2022-06-26 NOTE — Progress Notes (Signed)
Subjective:  Tyler Wu is a 77 y.o. male who presents for Chief Complaint  Patient presents with   Medical Management of Chronic Issues   Hypertension    1 month follow up.      Here for 29mo follow up on BP. Last visit 05/25/22 we advised he initiate Valsartan due to uncontrolled BP. However here today in follow up, he never started the [facility] readings: 140/67, 146/84, 129/66, 137/74, 110/66, 114/64, 137/69, 148/71, 95/58, 139/80 and similar other readings at various times.   Thing he has some element of white coat hypertension.   Since he has seen a variety of readings including some lower readings, he didn't start losartan.  He has cut back on chips and salt, has been better on serving size.  Last visit vitamin D was low.  He did started vitamin D supplement.   He has aortic atherosclerosis.  He declines statin or treatment per 05/25/22 visit.   No other aggravating or relieving factors.    No other c/o.  Past Medical History:  Diagnosis Date   Allergy    Aortic atherosclerosis (HCC) 10/2019   per CT chest   BPH (benign prostatic hyperplasia)    CLL (chronic lymphocytic leukemia) (HCC) 2005   followed by oncology yearly.   History of COVID-19 2021   Hypertension 2024   Current Outpatient Medications on File Prior to Visit  Medication Sig Dispense Refill   aspirin 81 MG chewable tablet Chew 81 mg by mouth.     cetirizine (ZYRTEC ALLERGY) 10 MG tablet Take 1 tablet (10 mg total) by mouth at bedtime. 90 tablet 3   fluticasone (FLONASE) 50 MCG/ACT nasal spray Place 1 spray into both nostrils daily. Begin by using 2 sprays in each nare daily for 3 to 5 days, then decrease to 1 spray in each nare daily. 15.8 mL 2   tamsulosin (FLOMAX) 0.4 MG CAPS capsule TAKE 1 CAPSULE(0.4 MG) BY MOUTH DAILY AFTER SUPPER 90 capsule 3   No current facility-administered medications on file prior to visit.    The following portions of the patient's history were reviewed and updated as  appropriate: allergies, current medications, past family history, past medical history, past social history, past surgical history and problem list.  ROS Otherwise as in subjective above    Objective: BP (!) 156/80 (BP Location: Left Arm, Cuff Size: Normal)   Pulse 70   Temp 97.9 F (36.6 C) (Oral)   Resp 16   Wt 154 lb 3.2 oz (69.9 kg)   SpO2 97% Comment: room air  BMI 23.97 kg/m   BP Readings from Last 3 Encounters:  06/26/22 (!) 156/80  05/25/22 (!) 150/70  05/23/22 (!) 185/81   Wt Readings from Last 3 Encounters:  06/26/22 154 lb 3.2 oz (69.9 kg)  05/25/22 153 lb 6.4 oz (69.6 kg)  03/24/22 153 lb (69.4 kg)   General appearance: alert, no distress, well developed, well nourished    Assessment: Encounter Diagnoses  Name Primary?   White coat syndrome with diagnosis of hypertension Yes   Aortic atherosclerosis (HCC)    Vitamin D deficiency      Plan: We comparing cuffs since he brought his cuff today, BP are consistent with his cuff and ours.  Home readings on average are much better, closer to normal or borderline.  After discussed home readings, goals of BP, risks of medicaiton, risks of uncontrolled BP, he wants to monitor and not start medication  Vit d deficiency -  continue supplement.  Plan to recheck lab in 3 months.     Patient Instructions  Recommendations: Hypertension Check blood pressures maybe 3 days per week Goal <130/90 Normal is 120/70 If you are seeing readings >130/80 routinely, then we need to start BP medication Your recent home readings had a bunch or normal , borderline and somewhat elevated readings You also seem to have a little white coat hypertension as your reading here today with your cuff and ours is much higher than your home readings Continue to limit salt, exercise regularly    Vitamin D deficiency Plan to return for a nurse visit for lab in 3-4 months    Aortic atherosclerosis  Atherosclerosis  Atherosclerosis is  when plaque builds up in the arteries. This causes narrowing and hardening of the arteries. Arteries are blood vessels that carry blood from the heart to all parts of the body. This blood contains oxygen. Plaque occurs due to inflammation or from a buildup of fat, cholesterol, calcium, waste products of cells, and a clotting material in the blood (fibrin). Plaque decreases the amount of blood that can flow through the artery. Atherosclerosis can affect any artery in your body, including: Heart arteries. Damage to these arteries may lead to coronary artery disease, which can cause a heart attack. Brain arteries. Damage to these arteries may cause a stroke. Leg, arm, and pelvis arteries. Peripheral artery disease (PAD) may result from damage to these arteries. Kidney arteries. Kidney (renal) failure may result from damage to kidney arteries. Treatment may slow the disease and prevent further damage to your heart, brain, peripheral arteries, and kidneys. What are the causes? This condition develops slowly over many years. The inner layers of your arteries become damaged and allow the gradual buildup of plaque. The exact cause of atherosclerosis is not fully understood. Symptoms of atherosclerosis do not occur until an artery becomes narrow or blocked. What increases the risk? The following factors may make you more likely to develop this condition: Being middle-aged or older. Certain medical conditions, including: High blood pressure. High cholesterol. High blood fats (triglycerides). Diabetes. Sleep apnea. Obesity. Certain lab levels, including: Elevated C-reactive protein (CRP). This is a sign of increased inflammation in your body. Elevated homocysteine levels. This is an amino acid that is associated with heart and blood vessel disease. Using tobacco or nicotine products. A family history of atherosclerosis. Not exercising enough (sedentary lifestyle). Being stressed. Drinking too much  alcohol or using drugs, such as cocaine or methamphetamine. What are the signs or symptoms? Symptoms of atherosclerosis do not occur until the plaque severely narrows or blocks the artery, which decreases blood flow. Sometimes, atherosclerosis does not cause symptoms. Symptoms of this condition include: Coronary artery disease. This may cause chest pain and shortness of breath. Decreased blood supply to your brain, which may cause a stroke. Signs of a stroke may include sudden: Weakness or numbness in your face, arm, or leg, especially on one side of your body. Trouble walking or difficulty moving your arms or legs. Loss of balance or coordination. Confusion. Slurred speech. Trouble speaking, or trouble understanding speech, or both (aphasia). Vision changes in one or both eyes. This may be double vision, blurred vision, or loss of vision. Severe headache with no known cause. The headache is often described as the worst headache ever experienced. PAD, which may cause pain, numbness, or nonhealing wounds, often in your legs and hips. Renal failure. This may cause tiredness, problems with urination, swelling, and itchy skin. How is  this diagnosed? This condition is diagnosed based on your medical history and a physical exam. During the exam, your health care provider will: Check your pulse in different places. Listen for a "whooshing" sound over your arteries (bruit). You may also have tests, such as: Blood tests to check your levels of cholesterol, triglycerides, blood sugar, and CRP. Ankle-brachial index to compare blood pressure in your arms to blood pressure in your ankles to see how your blood is flowing. Heart (cardiac) tests. Electrocardiogram (ECG) to check for heart damage. Stress test to see how your heart reacts to exercise. Ultrasound tests. Ultrasound of your peripheral arteries to check blood flow. Echocardiogram to get images of your heart's chambers and valves. X-ray  tests. Chest X-ray to see if you have an enlarged heart, which is a sign of heart failure. CT scan to check for damage to your heart, brain, or arteries. Angiogram. This is a test where dye is injected and X-rays are used to see the blood flow in the arteries. How is this treated? This condition is treated with lifestyle changes as the first step. These may include: Changing your diet. Losing weight. Reducing stress. Exercising and being physically active more regularly. Quitting smoking. You may also need medicine to: Lower triglycerides and cholesterol. Control blood pressure. Prevent blood clots. Lower inflammation in your body. Control your blood sugar. Sometimes, surgery is needed to: Remove plaque from an artery (endarterectomy). Open or widen a narrowed heart artery or peripheral artery (angioplasty). Create a new path for your blood with one of these procedures: Heart (coronary) artery bypass graft surgery. Peripheral artery bypass graft surgery. Place a small mesh tube (stent) in an artery to open or widen a narrowed artery. Follow these instructions at home: Eating and drinking  Eat a heart-healthy diet. Talk with your health care provider or a dietitian if you need help. A heart-healthy diet involves: Limiting unhealthy fats and increasing healthy fats. Some examples of healthy fats are avocados and olive oil. Eating plant-based foods, such as fruits, vegetables, nuts, whole grains, and legumes (such as peas and lentils). If you drink alcohol: Limit how much you have to: 0-1 drink a day for women who are not pregnant. 0-2 drinks a day for men. Know how much alcohol is in a drink. In the [place] one drink equals one 12 oz bottle of beer (355 mL), one 5 oz glass of wine (148 mL), or one 1 oz glass of hard liquor (44 mL). Lifestyle  Maintain a healthy weight. Lose weight if your health care provider says that you need to do that. Follow an exercise program as told by your  health care provider. Do not use any products that contain nicotine or tobacco. These products include cigarettes, chewing tobacco, and vaping devices, such as e-cigarettes. If you need help quitting, ask your health care provider. Do not use drugs. General instructions Take over-the-counter and prescription medicines only as told by your health care provider. Manage other health conditions as told. Keep all follow-up visits. This is important. Contact a health care provider if you have: An irregular heartbeat. Unexplained tiredness (fatigue). Trouble urinating, or you are producing less urine or foamy urine. Swelling of your hands or feet, or itchy skin. Unexplained pain or numbness in your legs or hips. A wound that is slow to heal or is not healing. Get help right away if: You have any symptoms of a heart attack. These may be: Chest pain. This includes squeezing chest pain that may feel  like indigestion (angina). Shortness of breath. Pain in your neck, jaw, arms, back, or stomach. Cold sweat. Nausea. Light-headedness. Sudden pain, numbness, or coldness in a limb. You have any symptoms of a stroke. "BE FAST" is an easy way to remember the main warning signs of a stroke: B - Balance. Signs are dizziness, sudden trouble walking, or loss of balance. E - Eyes. Signs are trouble seeing or a sudden change in vision. F - Face. Signs are sudden weakness or numbness of the face, or the face or eyelid drooping on one side. A - Arms. Signs are weakness or numbness in an arm. This happens suddenly and usually on one side of the body. S - Speech. Signs are sudden trouble speaking, slurred speech, or trouble understanding what people say. T - Time. Time to call emergency services. Write down what time symptoms started. You have other signs of a stroke, such as: A sudden, severe headache with no known cause. Nausea or vomiting. Seizure. These symptoms may represent a serious problem that is an  emergency. Do not wait to see if the symptoms will go away. Get medical help right away. Call your local emergency services (911 in the [place] Do not drive yourself to the hospital. Summary Atherosclerosis is when plaque builds up in the arteries and causes narrowing and hardening of the arteries. Plaque occurs due to inflammation or from a buildup of fat, cholesterol, calcium, cellular waste products, and fibrin. This condition may not cause any symptoms. Symptoms of atherosclerosis do not occur until the plaque severely narrows or blocks the artery. Treatment starts with lifestyle changes and may include medicines. In some cases, surgery is needed. Get help right away if you have any symptoms of a heart attack or stroke. This information is not intended to replace advice given to you by your health care provider. Make sure you discuss any questions you have with your health care provider. Document Revised: 04/17/2020 Document Reviewed: 04/17/2020 Elsevier Patient Education  2024 [employer]    Tyler Wu was seen today for medical management of chronic issues and hypertension.  Diagnoses and all orders for this visit:  White coat syndrome with diagnosis of hypertension  Aortic atherosclerosis (HCC)  Vitamin D deficiency -     VITAMIN D 25 Hydroxy (Vit-D Deficiency, Fractures); Future -     Basic metabolic panel; Future  Other orders -     Vitamin D, Ergocalciferol, (DRISDOL) 1.25 MG (50000 UNIT) CAPS capsule; Take 1 capsule (50,000 Units total) by mouth every 7 (seven) days.    Follow up: 48mo

## 2022-06-26 NOTE — Patient Instructions (Addendum)
Recommendations: Hypertension Check blood pressures maybe 3 days per week Goal <130/90 Normal is 120/70 If you are seeing readings >130/80 routinely, then we need to start BP medication Your recent home readings had a bunch or normal , borderline and somewhat elevated readings You also seem to have a little white coat hypertension as your reading here today with your cuff and ours is much higher than your home readings Continue to limit salt, exercise regularly    Vitamin D deficiency Plan to return for a nurse visit for lab in 3-4 months    Aortic atherosclerosis  Atherosclerosis  Atherosclerosis is when plaque builds up in the arteries. This causes narrowing and hardening of the arteries. Arteries are blood vessels that carry blood from the heart to all parts of the body. This blood contains oxygen. Plaque occurs due to inflammation or from a buildup of fat, cholesterol, calcium, waste products of cells, and a clotting material in the blood (fibrin). Plaque decreases the amount of blood that can flow through the artery. Atherosclerosis can affect any artery in your body, including: Heart arteries. Damage to these arteries may lead to coronary artery disease, which can cause a heart attack. Brain arteries. Damage to these arteries may cause a stroke. Leg, arm, and pelvis arteries. Peripheral artery disease (PAD) may result from damage to these arteries. Kidney arteries. Kidney (renal) failure may result from damage to kidney arteries. Treatment may slow the disease and prevent further damage to your heart, brain, peripheral arteries, and kidneys. What are the causes? This condition develops slowly over many years. The inner layers of your arteries become damaged and allow the gradual buildup of plaque. The exact cause of atherosclerosis is not fully understood. Symptoms of atherosclerosis do not occur until an artery becomes narrow or blocked. What increases the risk? The following  factors may make you more likely to develop this condition: Being middle-aged or older. Certain medical conditions, including: High blood pressure. High cholesterol. High blood fats (triglycerides). Diabetes. Sleep apnea. Obesity. Certain lab levels, including: Elevated C-reactive protein (CRP). This is a sign of increased inflammation in your body. Elevated homocysteine levels. This is an amino acid that is associated with heart and blood vessel disease. Using tobacco or nicotine products. A family history of atherosclerosis. Not exercising enough (sedentary lifestyle). Being stressed. Drinking too much alcohol or using drugs, such as cocaine or methamphetamine. What are the signs or symptoms? Symptoms of atherosclerosis do not occur until the plaque severely narrows or blocks the artery, which decreases blood flow. Sometimes, atherosclerosis does not cause symptoms. Symptoms of this condition include: Coronary artery disease. This may cause chest pain and shortness of breath. Decreased blood supply to your brain, which may cause a stroke. Signs of a stroke may include sudden: Weakness or numbness in your face, arm, or leg, especially on one side of your body. Trouble walking or difficulty moving your arms or legs. Loss of balance or coordination. Confusion. Slurred speech. Trouble speaking, or trouble understanding speech, or both (aphasia). Vision changes in one or both eyes. This may be double vision, blurred vision, or loss of vision. Severe headache with no known cause. The headache is often described as the worst headache ever experienced. PAD, which may cause pain, numbness, or nonhealing wounds, often in your legs and hips. Renal failure. This may cause tiredness, problems with urination, swelling, and itchy skin. How is this diagnosed? This condition is diagnosed based on your medical history and a physical exam. During the exam,  your health care provider will: Check your  pulse in different places. Listen for a "whooshing" sound over your arteries (bruit). You may also have tests, such as: Blood tests to check your levels of cholesterol, triglycerides, blood sugar, and CRP. Ankle-brachial index to compare blood pressure in your arms to blood pressure in your ankles to see how your blood is flowing. Heart (cardiac) tests. Electrocardiogram (ECG) to check for heart damage. Stress test to see how your heart reacts to exercise. Ultrasound tests. Ultrasound of your peripheral arteries to check blood flow. Echocardiogram to get images of your heart's chambers and valves. X-ray tests. Chest X-ray to see if you have an enlarged heart, which is a sign of heart failure. CT scan to check for damage to your heart, brain, or arteries. Angiogram. This is a test where dye is injected and X-rays are used to see the blood flow in the arteries. How is this treated? This condition is treated with lifestyle changes as the first step. These may include: Changing your diet. Losing weight. Reducing stress. Exercising and being physically active more regularly. Quitting smoking. You may also need medicine to: Lower triglycerides and cholesterol. Control blood pressure. Prevent blood clots. Lower inflammation in your body. Control your blood sugar. Sometimes, surgery is needed to: Remove plaque from an artery (endarterectomy). Open or widen a narrowed heart artery or peripheral artery (angioplasty). Create a new path for your blood with one of these procedures: Heart (coronary) artery bypass graft surgery. Peripheral artery bypass graft surgery. Place a small mesh tube (stent) in an artery to open or widen a narrowed artery. Follow these instructions at home: Eating and drinking  Eat a heart-healthy diet. Talk with your health care provider or a dietitian if you need help. A heart-healthy diet involves: Limiting unhealthy fats and increasing healthy fats. Some examples  of healthy fats are avocados and olive oil. Eating plant-based foods, such as fruits, vegetables, nuts, whole grains, and legumes (such as peas and lentils). If you drink alcohol: Limit how much you have to: 0-1 drink a day for women who are not pregnant. 0-2 drinks a day for men. Know how much alcohol is in a drink. In the [place] one drink equals one 12 oz bottle of beer (355 mL), one 5 oz glass of wine (148 mL), or one 1 oz glass of hard liquor (44 mL). Lifestyle  Maintain a healthy weight. Lose weight if your health care provider says that you need to do that. Follow an exercise program as told by your health care provider. Do not use any products that contain nicotine or tobacco. These products include cigarettes, chewing tobacco, and vaping devices, such as e-cigarettes. If you need help quitting, ask your health care provider. Do not use drugs. General instructions Take over-the-counter and prescription medicines only as told by your health care provider. Manage other health conditions as told. Keep all follow-up visits. This is important. Contact a health care provider if you have: An irregular heartbeat. Unexplained tiredness (fatigue). Trouble urinating, or you are producing less urine or foamy urine. Swelling of your hands or feet, or itchy skin. Unexplained pain or numbness in your legs or hips. A wound that is slow to heal or is not healing. Get help right away if: You have any symptoms of a heart attack. These may be: Chest pain. This includes squeezing chest pain that may feel like indigestion (angina). Shortness of breath. Pain in your neck, jaw, arms, back, or stomach. Cold sweat. Nausea.  Light-headedness. Sudden pain, numbness, or coldness in a limb. You have any symptoms of a stroke. "BE FAST" is an easy way to remember the main warning signs of a stroke: B - Balance. Signs are dizziness, sudden trouble walking, or loss of balance. E - Eyes. Signs are trouble  seeing or a sudden change in vision. F - Face. Signs are sudden weakness or numbness of the face, or the face or eyelid drooping on one side. A - Arms. Signs are weakness or numbness in an arm. This happens suddenly and usually on one side of the body. S - Speech. Signs are sudden trouble speaking, slurred speech, or trouble understanding what people say. T - Time. Time to call emergency services. Write down what time symptoms started. You have other signs of a stroke, such as: A sudden, severe headache with no known cause. Nausea or vomiting. Seizure. These symptoms may represent a serious problem that is an emergency. Do not wait to see if the symptoms will go away. Get medical help right away. Call your local emergency services (911 in the [place] Do not drive yourself to the hospital. Summary Atherosclerosis is when plaque builds up in the arteries and causes narrowing and hardening of the arteries. Plaque occurs due to inflammation or from a buildup of fat, cholesterol, calcium, cellular waste products, and fibrin. This condition may not cause any symptoms. Symptoms of atherosclerosis do not occur until the plaque severely narrows or blocks the artery. Treatment starts with lifestyle changes and may include medicines. In some cases, surgery is needed. Get help right away if you have any symptoms of a heart attack or stroke. This information is not intended to replace advice given to you by your health care provider. Make sure you discuss any questions you have with your health care provider. Document Revised: 04/17/2020 Document Reviewed: 04/17/2020 Elsevier Patient Education  2024 [employer].

## 2022-08-19 ENCOUNTER — Other Ambulatory Visit: Payer: Self-pay | Admitting: Medical

## 2022-08-31 ENCOUNTER — Other Ambulatory Visit: Payer: Self-pay | Admitting: Medical

## 2022-08-31 NOTE — Telephone Encounter (Signed)
Refilled this 11/2021 #90 with 3 refills

## 2022-09-21 ENCOUNTER — Other Ambulatory Visit: Payer: Medicare HMO

## 2022-09-21 DIAGNOSIS — E559 Vitamin D deficiency, unspecified: Secondary | ICD-10-CM

## 2022-09-22 ENCOUNTER — Other Ambulatory Visit: Payer: Self-pay | Admitting: Medical

## 2022-09-22 LAB — VITAMIN D 25 HYDROXY (VIT D DEFICIENCY, FRACTURES): Vit D, 25-Hydroxy: 77.1 ng/mL (ref 30.0–100.0)

## 2022-09-22 LAB — BASIC METABOLIC PANEL
BUN/Creatinine Ratio: 11 (ref 10–24)
BUN: 14 mg/dL (ref 8–27)
CO2: 22 mmol/L (ref 20–29)
Calcium: 8.9 mg/dL (ref 8.6–10.2)
Chloride: 104 mmol/L (ref 96–106)
Creatinine, Ser: 1.22 mg/dL (ref 0.76–1.27)
Glucose: 90 mg/dL (ref 70–99)
Potassium: 4.2 mmol/L (ref 3.5–5.2)
Sodium: 141 mmol/L (ref 134–144)
eGFR: 61 mL/min/{1.73_m2} (ref >59)

## 2022-09-22 MED ORDER — VITAMIN D (ERGOCALCIFEROL) 1.25 MG (50000 UNIT) PO CAPS: 50000.0000 [IU] | ORAL_CAPSULE | ORAL | 2 refills | Status: DC

## 2022-09-22 NOTE — Progress Notes (Signed)
Vitamin D and electrolytes look normal  Get his most recent normal blood pressure reading and put in a separate encounter for blood pressure check visit to document blood pressure to fulfill the gap and metric measure

## 2022-09-23 ENCOUNTER — Telehealth: Payer: Self-pay | Admitting: Internal Medicine

## 2022-09-23 ENCOUNTER — Other Ambulatory Visit: Payer: Medicare HMO

## 2022-09-23 NOTE — Telephone Encounter (Signed)
Error

## 2022-09-23 NOTE — Progress Notes (Unsigned)
Pt checked bp outside of office 140/70

## 2022-11-04 ENCOUNTER — Other Ambulatory Visit: Payer: Self-pay | Admitting: Medical

## 2022-11-27 ENCOUNTER — Inpatient Hospital Stay (HOSPITAL_BASED_OUTPATIENT_CLINIC_OR_DEPARTMENT_OTHER): Payer: Medicare HMO | Admitting: Hematology and Oncology

## 2022-11-27 ENCOUNTER — Encounter: Payer: Self-pay | Admitting: Hematology and Oncology

## 2022-11-27 ENCOUNTER — Inpatient Hospital Stay: Payer: Medicare HMO | Attending: Hematology and Oncology

## 2022-11-27 VITALS — BP 161/77 | HR 54 | Temp 97.7°F | Resp 18 | Ht 67.25 in | Wt 155.6 lb

## 2022-11-27 DIAGNOSIS — C9110 Chronic lymphocytic leukemia of B-cell type not having achieved remission: Secondary | ICD-10-CM

## 2022-11-27 DIAGNOSIS — R030 Elevated blood-pressure reading, without diagnosis of hypertension: Secondary | ICD-10-CM | POA: Diagnosis not present

## 2022-11-27 LAB — CBC WITH DIFFERENTIAL/PLATELET
Abs Immature Granulocytes: 0.05 10*3/uL (ref 0.00–0.07)
Basophils Absolute: 0.1 10*3/uL (ref 0.0–0.1)
Basophils Relative: 0 %
Eosinophils Absolute: 0.2 10*3/uL (ref 0.0–0.5)
Eosinophils Relative: 1 %
HCT: 45 % (ref 39.0–52.0)
Hemoglobin: 14.3 g/dL (ref 13.0–17.0)
Immature Granulocytes: 0 %
Lymphocytes Relative: 80 %
Lymphs Abs: 21.8 10*3/uL — ABNORMAL HIGH (ref 0.7–4.0)
MCH: 28.8 pg (ref 26.0–34.0)
MCHC: 31.8 g/dL (ref 30.0–36.0)
MCV: 90.5 fL (ref 80.0–100.0)
Monocytes Absolute: 0.7 10*3/uL (ref 0.1–1.0)
Monocytes Relative: 3 %
Neutro Abs: 4.4 10*3/uL (ref 1.7–7.7)
Neutrophils Relative %: 16 %
Platelets: 153 10*3/uL (ref 150–400)
RBC: 4.97 MIL/uL (ref 4.22–5.81)
RDW: 13.9 % (ref 11.5–15.5)
Smear Review: NORMAL
WBC: 27.2 10*3/uL — ABNORMAL HIGH (ref 4.0–10.5)
nRBC: 0 % (ref 0.0–0.2)

## 2022-11-27 NOTE — Assessment & Plan Note (Signed)
This could be exacerbated by mild anxiety today Observe closely for now

## 2022-11-27 NOTE — Progress Notes (Signed)
[facility] OFFICE PROGRESS NOTE  Patient Care Team: Tysinger, Kermit Balo, PA-C as PCP - General (Family Medicine) Artis Delay, MD as Consulting Physician (Hematology and Oncology)  ASSESSMENT & PLAN:  CLL (chronic lymphocytic leukemia) (HCC) The palpable lymphadenopathy in the submandibular region is almost impossible to palpate today There is no appreciable progression of CLL I recommend close monitoring for now I will see him again in a year for further follow-up I reviewed his vaccination schedule and he appears to be up-to-date with all his vaccination  Elevated BP without diagnosis of hypertension This could be exacerbated by mild anxiety today Observe closely for now  No orders of the defined types were placed in this encounter.   All questions were answered. The patient knows to call the clinic with any problems, questions or concerns. The total time spent in the appointment was 20 minutes encounter with patients including review of chart and various tests results, discussions about plan of care and coordination of care plan   Artis Delay, MD 11/27/2022 10:24 AM  INTERVAL HISTORY: Please see below for problem oriented charting. he returns for surveillance follow-up for CLL He is doing well No recurrent infection No progression or enlarging lymphadenopathy The patient continues to work at a warehouse  REVIEW OF SYSTEMS:   Constitutional: Denies fevers, chills or abnormal weight loss Eyes: Denies blurriness of vision Ears, nose, mouth, throat, and face: Denies mucositis or sore throat Respiratory: Denies cough, dyspnea or wheezes Cardiovascular: Denies palpitation, chest discomfort or lower extremity swelling Gastrointestinal:  Denies nausea, heartburn or change in bowel habits Skin: Denies abnormal skin rashes Lymphatics: Denies new lymphadenopathy or easy bruising Neurological:Denies numbness, tingling or new weaknesses Behavioral/Psych: Mood is stable,  no new changes  All other systems were reviewed with the patient and are negative.  I have reviewed the past medical history, past surgical history, social history and family history with the patient and they are unchanged from previous note.  ALLERGIES:  is allergic to levofloxacin.  MEDICATIONS:  Current Outpatient Medications  Medication Sig Dispense Refill   aspirin 81 MG chewable tablet Chew 81 mg by mouth.     cetirizine (ZYRTEC ALLERGY) 10 MG tablet Take 1 tablet (10 mg total) by mouth at bedtime. 90 tablet 3   fluticasone (FLONASE) 50 MCG/ACT nasal spray PLACE 2 SPRAYS INTO BOTH NOSTRILS DAILY FOR 3 TO 5 DAYS, THEN DECREASE TO 1 SPRAY DAILY 16 g 0   tamsulosin (FLOMAX) 0.4 MG CAPS capsule TAKE 1 CAPSULE(0.4 MG) BY MOUTH DAILY AFTER SUPPER 90 capsule 3   Vitamin D, Ergocalciferol, (DRISDOL) 1.25 MG (50000 UNIT) CAPS capsule Take 1 capsule (50,000 Units total) by mouth every 7 (seven) days. 12 capsule 2   No current facility-administered medications for this visit.    SUMMARY OF ONCOLOGIC HISTORY: Oncology History  CLL (chronic lymphocytic leukemia) (HCC)  04/18/2003 Pathology Results   FLOW CYTOMETRIC ANALYSIS OF THE LYMPHOID POPULATION SHOWS A MONOCLONAL, LAMBDA RESTRICTED (DIM) B-CELL POPULATION EXPRESSING PAN B-CELL ANTIGENS INCLUDING CD20 AND CD23. THIS IS ASSOCIATED WITH COEXPRESSION OF CD5 AND CD20. NO CD10 EXPRESSION IS IDENTIFIED. THE FEATURES ARE COMPATIBLE WITH CHRONIC LYMPHOCYTIC  LEUKEMIA.   09/05/2003 Pathology Results   FLOW CYTOMETRY ANALYSIS OF THE CHRONIC LYMPHOCYTIC LEUKEMIA POPULATION SHOWS THAT APPROXIMATELY 18% OF THE CELLS ARE POSITIVE FOR ZAP-70. AS 20% IS THE CUT OFF FOR POSITIVITY, THIS IS INTERPRETED AS A BORDERLINE/NEGATIVE RESULT   11/23/2018 Cancer Staging   Staging form: Chronic Lymphocytic Leukemia / Small Lymphocytic Lymphoma,  AJCC 8th Edition - Clinical stage from 11/23/2018: Modified Rai Stage I (Modified Rai risk: Intermediate, Lymphocytosis:  Present, Adenopathy: Present, Organomegaly: Absent, Anemia: Absent, Thrombocytopenia: Absent) - Signed by Artis Delay, MD on 11/23/2018     PHYSICAL EXAMINATION: ECOG PERFORMANCE STATUS: 0 - Asymptomatic  Vitals:   11/27/22 0958  BP: (!) 161/77  Pulse: (!) 54  Resp: 18  Temp: 97.7 F (36.5 C)  SpO2: 99%   Filed Weights   11/27/22 0958  Weight: 155 lb 9.6 oz (70.6 kg)    GENERAL:alert, no distress and comfortable SKIN: skin color, texture, turgor are normal, no rashes or significant lesions.  Noted benign cyst on the left forearm EYES: normal, Conjunctiva are pink and non-injected, sclera clear OROPHARYNX:no exudate, no erythema and lips, buccal mucosa, and tongue normal  NECK: supple, thyroid normal size, non-tender, without nodularity LYMPH:  no palpable lymphadenopathy in the cervical, axillary or inguinal LUNGS: clear to auscultation and percussion with normal breathing effort HEART: regular rate & rhythm and no murmurs and no lower extremity edema ABDOMEN:abdomen soft, non-tender and normal bowel sounds Musculoskeletal:no cyanosis of digits and no clubbing  NEURO: alert & oriented x 3 with fluent speech, no focal motor/sensory deficits  LABORATORY DATA:  I have reviewed the data as listed    Component Value Date/Time   NA 141 09/21/2022 1007   NA 140 11/22/2014 1320   K 4.2 09/21/2022 1007   K 3.8 11/22/2014 1320   CL 104 09/21/2022 1007   CL 107 11/20/2011 1011   CO2 22 09/21/2022 1007   CO2 24 11/22/2014 1320   GLUCOSE 90 09/21/2022 1007   GLUCOSE 91 11/23/2017 1233   GLUCOSE 154 (H) 11/22/2014 1320   GLUCOSE 87 11/20/2011 1011   BUN 14 09/21/2022 1007   BUN 10.5 11/22/2014 1320   CREATININE 1.22 09/21/2022 1007   CREATININE 1.29 (H) 03/14/2015 0841   CREATININE 1.3 11/22/2014 1320   CALCIUM 8.9 09/21/2022 1007   CALCIUM 9.1 11/22/2014 1320   PROT 6.3 05/25/2022 1514   PROT 6.4 11/22/2014 1320   ALBUMIN 4.6 05/25/2022 1514   ALBUMIN 4.1 11/22/2014 1320    AST 18 05/25/2022 1514   AST 13 11/22/2014 1320   ALT 17 05/25/2022 1514   ALT 14 11/22/2014 1320   ALKPHOS 78 05/25/2022 1514   ALKPHOS 67 11/22/2014 1320   BILITOT 0.5 05/25/2022 1514   BILITOT 0.64 11/22/2014 1320   GFRNONAA 75 03/08/2020 1042   GFRAA 86 03/08/2020 1042    No results found for: "SPEP", "UPEP"  Lab Results  Component Value Date   WBC 27.2 (H) 11/27/2022   NEUTROABS 4.4 11/27/2022   HGB 14.3 11/27/2022   HCT 45.0 11/27/2022   MCV 90.5 11/27/2022   PLT 153 11/27/2022      Chemistry      Component Value Date/Time   NA 141 09/21/2022 1007   NA 140 11/22/2014 1320   K 4.2 09/21/2022 1007   K 3.8 11/22/2014 1320   CL 104 09/21/2022 1007   CL 107 11/20/2011 1011   CO2 22 09/21/2022 1007   CO2 24 11/22/2014 1320   BUN 14 09/21/2022 1007   BUN 10.5 11/22/2014 1320   CREATININE 1.22 09/21/2022 1007   CREATININE 1.29 (H) 03/14/2015 0841   CREATININE 1.3 11/22/2014 1320      Component Value Date/Time   CALCIUM 8.9 09/21/2022 1007   CALCIUM 9.1 11/22/2014 1320   ALKPHOS 78 05/25/2022 1514   ALKPHOS 67 11/22/2014 1320  AST 18 05/25/2022 1514   AST 13 11/22/2014 1320   ALT 17 05/25/2022 1514   ALT 14 11/22/2014 1320   BILITOT 0.5 05/25/2022 1514   BILITOT 0.64 11/22/2014 1320

## 2022-11-27 NOTE — Assessment & Plan Note (Signed)
The palpable lymphadenopathy in the submandibular region is almost impossible to palpate today There is no appreciable progression of CLL I recommend close monitoring for now I will see him again in a year for further follow-up I reviewed his vaccination schedule and he appears to be up-to-date with all his vaccination

## 2022-12-08 ENCOUNTER — Ambulatory Visit
Admission: EM | Admit: 2022-12-08 | Discharge: 2022-12-08 | Disposition: A | Discharge status: Home / Self Care | Payer: Medicare HMO | Attending: Internal Medicine | Admitting: Internal Medicine

## 2022-12-08 ENCOUNTER — Other Ambulatory Visit: Payer: Self-pay

## 2022-12-08 ENCOUNTER — Encounter: Payer: Self-pay | Admitting: Emergency Medicine

## 2022-12-08 DIAGNOSIS — R051 Acute cough: Secondary | ICD-10-CM | POA: Diagnosis not present

## 2022-12-08 DIAGNOSIS — J069 Acute upper respiratory infection, unspecified: Secondary | ICD-10-CM | POA: Diagnosis not present

## 2022-12-08 MED ORDER — BENZONATATE 100 MG PO CAPS: 100.0000 mg | ORAL_CAPSULE | Freq: Three times a day (TID) | ORAL | 0 refills | Status: DC | PRN

## 2022-12-08 MED ORDER — AMOXICILLIN-POT CLAVULANATE 875-125 MG PO TABS: 1.0000 | ORAL_TABLET | Freq: Two times a day (BID) | ORAL | 0 refills | Status: DC

## 2022-12-08 NOTE — ED Triage Notes (Signed)
Pt here c/o cough and congestion x 1 week; pt sts OTC meds are not helping

## 2022-12-08 NOTE — ED Provider Notes (Signed)
[name]    CSN: [id number] Arrival date & time: 12/08/22  0915      History   Chief Complaint Chief Complaint  Patient presents with   Cough    HPI Tyler Wu is a 77 y.o. male.   Patient presents with nasal congestion and cough that started about 7 to 8 days ago.  He denies any fever but does report that he was having generalized bodyaches.  Denies any known sick contacts.  Reports that he took a COVID test at home that was negative.  Denies chest pain or shortness of breath.  Denies history of asthma or COPD.  Patient is a previous cigarette smoker but reports that he quit smoking in the 1970s.  He has taken several different over-the-counter cold and flu medication with no improvement in symptoms.  Patient's blood pressure is elevated but he reports this is typical when he comes to the doctor's office.  States he checks his blood pressure routinely at home and it is typically 140 systolic.  Patient is not reporting any chest pain, shortness of breath, headache, dizziness, blurred vision, nausea, vomiting.   Cough   Past Medical History:  Diagnosis Date   Allergy    Aortic atherosclerosis (HCC) 10/2019   per CT chest   BPH (benign prostatic hyperplasia)    CLL (chronic lymphocytic leukemia) (HCC) 2005   followed by oncology yearly.   History of COVID-19 2021   Hypertension 2024    Patient Active Problem List   Diagnosis Date Noted   Essential hypertension, benign 05/25/2022   Benign prostatic hyperplasia with urinary frequency 12/05/2021   Thrombocytopenia (HCC) 11/25/2020   Injury of nail bed of finger 11/25/2020   Vitamin D deficiency 08/26/2020   Encounter for health maintenance examination in adult 05/02/2020   Medicare annual wellness visit, subsequent 05/02/2020   Screening for lipid disorders 05/02/2020   Vaccine counseling 05/02/2020   Advanced directives, counseling/discussion 05/02/2020   Enlarged prostate 03/08/2020   Lymph node  enlargement 03/08/2020   Urinary urgency 03/08/2020   History of COVID-19 11/13/2019   Aortic atherosclerosis (HCC) 11/03/2019   History of skin cancer 11/24/2016   Lymphadenopathy of head and neck 11/22/2014   Multiple skin nodules 11/22/2013   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 Authorizing Provider  amoxicillin-clavulanate (AUGMENTIN) 875-125 MG tablet Take 1 tablet by mouth every 12 (twelve) hours. 12/08/22  Yes Lamia Mariner, Rolly Salter E, FNP  benzonatate (TESSALON) 100 MG capsule Take 1 capsule (100 mg total) by mouth every 8 (eight) hours as needed for cough. 12/08/22  Yes Gustavus Bryant, FNP  aspirin 81 MG chewable tablet Chew 81 mg by mouth.    [provider]  cetirizine (ZYRTEC ALLERGY) 10 MG tablet Take 1 tablet (10 mg total) by mouth at bedtime. 05/27/22 11/23/22  Tysinger, Kermit Balo, PA-C  fluticasone (FLONASE) 50 MCG/ACT nasal spray PLACE 2 SPRAYS INTO BOTH NOSTRILS DAILY FOR 3 TO 5 DAYS, THEN DECREASE TO 1 SPRAY DAILY 11/04/22   Tysinger, Kermit Balo, PA-C  tamsulosin (FLOMAX) 0.4 MG CAPS capsule TAKE 1 CAPSULE(0.4 MG) BY MOUTH DAILY AFTER SUPPER 12/05/21   Tysinger, Kermit Balo, PA-C  Vitamin D, Ergocalciferol, (DRISDOL)  1.25 MG (50000 UNIT) CAPS capsule Take 1 capsule (50,000 Units total) by mouth every 7 (seven) days. 09/22/22   Tysinger, Kermit Balo, PA-C    Family History Family History  Problem Relation Age of Onset   Cancer Father 15       melonoma   Heart attack Maternal Grandmother    Hypertension Maternal Grandmother    Heart attack Maternal Grandfather    Hypertension Maternal Grandfather    Kidney disease Brother     Social History Social History   Tobacco Use   Smoking status: Former    Current packs/day: 0.00     Types: Cigarettes    Quit date: 01/26/1974    Years since quitting: 48.8    Passive exposure: Past   Smokeless tobacco: Never   Tobacco comments:    Quit smoking 1976  Vaping Use   Vaping status: Never Used  Substance Use Topics   Alcohol use: No   Drug use: No     Allergies   Levofloxacin   Review of Systems Review of Systems Per HPI  Physical Exam Triage Vital Signs ED Triage Vitals [12/08/22 1013]  Encounter Vitals Group     BP (!) 181/81     Systolic BP Percentile      Diastolic BP Percentile      Pulse Rate (!) 58     Resp 18     Temp 97.9 F (36.6 C)     Temp Source Oral     SpO2 97 %     Weight      Height      Head Circumference      Peak Flow      Pain Score 0     Pain Loc      Pain Education      Exclude from Growth Chart    No data found.  Updated Vital Signs BP (!) 181/81 (BP Location: Left Arm)   Pulse (!) 58   Temp 97.9 F (36.6 C) (Oral)   Resp 18   SpO2 97%   Visual Acuity Right Eye Distance:   Left Eye Distance:   Bilateral Distance:    Right Eye Near:   Left Eye Near:    Bilateral Near:     Physical Exam Constitutional:      General: He is not in acute distress.    Appearance: Normal appearance. He is not toxic-appearing or diaphoretic.  HENT:     Head: Normocephalic and atraumatic.     Right Ear: Tympanic membrane and ear canal normal.     Left Ear: Tympanic membrane and ear canal normal.     Nose: Congestion present.     Mouth/Throat:     Mouth: Mucous membranes are moist.     Pharynx: No posterior oropharyngeal erythema.  Eyes:     Extraocular Movements: Extraocular movements intact.     Conjunctiva/sclera: Conjunctivae normal.     Pupils: Pupils are equal, round, and reactive to light.  Cardiovascular:     Rate and Rhythm: Normal rate and regular rhythm.     Pulses: Normal pulses.     Heart sounds: Normal heart sounds.  Pulmonary:     Effort: Pulmonary effort is normal. No respiratory distress.     Breath  sounds: Normal breath sounds. No stridor. No wheezing, rhonchi or rales.  Abdominal:     General: Abdomen is flat. Bowel sounds are normal.     Palpations: Abdomen is soft.  Musculoskeletal:  General: Normal range of motion.     Cervical back: Normal range of motion.  Skin:    General: Skin is warm and dry.  Neurological:     General: No focal deficit present.     Mental Status: He is alert and oriented to person, place, and time. Mental status is at baseline.  Psychiatric:        Mood and Affect: Mood normal.        Behavior: Behavior normal.      UC Treatments / Results  Labs (all labs ordered are listed, but only abnormal results are displayed) Labs Reviewed - No data to display  EKG   Radiology No results found.  Procedures Procedures (including critical care time)  Medications Ordered in UC Medications - No data to display  Initial Impression / Assessment and Plan / UC Course  I have reviewed the triage vital signs and the nursing notes.  Pertinent labs & imaging results that were available during my care of the patient were reviewed by me and considered in my medical decision making (see chart for details).     Symptoms most likely started off as a viral illness, given but given duration of symptoms and symptoms being refractory to over-the-counter medications will opt to treat with antibiotic therapy today.  Augmentin prescribed for patient.  Creatinine clearance is normal so no dosage adjustment necessary.  Benzonatate prescribed to take as needed for cough.  There are no adventitious lung sounds on exam so do not think that chest imaging is necessary.  Advised strict follow-up precautions.  Patient has elevated blood pressure reading but after further review of the chart, it appears that PCP is suspicious of whitecoat syndrome.  He also reports blood pressures are typically normal at home.  Therefore, advised patient to monitor at home and do not think any  further workup is necessary for this.  Patient verbalized understanding and was agreeable with plan. Final Clinical Impressions(s) / UC Diagnoses   Final diagnoses:  Acute upper respiratory infection  Acute cough     Discharge Instructions      I have prescribed you an antibiotic and cough medication for upper respiratory infection.  Follow-up if any symptoms persist or worsen.    ED Prescriptions     Medication Sig Dispense Auth. Provider   amoxicillin-clavulanate (AUGMENTIN) 875-125 MG tablet Take 1 tablet by mouth every 12 (twelve) hours. 14 tablet [place], [place], [place]   benzonatate (TESSALON) 100 MG capsule Take 1 capsule (100 mg total) by mouth every 8 (eight) hours as needed for cough. 21 capsule [place], Acie Fredrickson, [place]      PDMP not reviewed this encounter.   Gustavus Bryant, [place] 12/08/22 1119

## 2022-12-08 NOTE — Discharge Instructions (Signed)
I have prescribed you an antibiotic and cough medication for upper respiratory infection.  Follow-up if any symptoms persist or worsen.

## 2022-12-16 ENCOUNTER — Ambulatory Visit
Admission: EM | Admit: 2022-12-16 | Discharge: 2022-12-16 | Disposition: A | Discharge status: Home / Self Care | Payer: TRICARE For Life (TFL) | Attending: Internal Medicine | Admitting: Internal Medicine

## 2022-12-16 ENCOUNTER — Other Ambulatory Visit: Payer: Self-pay

## 2022-12-16 ENCOUNTER — Ambulatory Visit
Admission: RE | Admit: 2022-12-16 | Discharge: 2022-12-16 | Disposition: A | Discharge status: Home / Self Care | Payer: Medicare HMO | Source: Ambulatory Visit | Attending: Internal Medicine | Admitting: Internal Medicine

## 2022-12-16 ENCOUNTER — Encounter: Payer: Self-pay | Admitting: Emergency Medicine

## 2022-12-16 DIAGNOSIS — R059 Cough, unspecified: Secondary | ICD-10-CM | POA: Diagnosis not present

## 2022-12-16 DIAGNOSIS — J069 Acute upper respiratory infection, unspecified: Secondary | ICD-10-CM

## 2022-12-16 DIAGNOSIS — R053 Chronic cough: Secondary | ICD-10-CM

## 2022-12-16 MED ORDER — AZITHROMYCIN 250 MG PO TABS: ORAL_TABLET | ORAL | 0 refills | Status: AC

## 2022-12-16 NOTE — ED Triage Notes (Signed)
Pt reports cough, nasal congestion, and postnasal drainage x 2 weeks. Pt has taken all of amoxicillin except last 2 doses due to GI upset. Reports no relief with antibiotics or coricidin use

## 2022-12-16 NOTE — ED Provider Notes (Signed)
[facility]    CSN: [id number] Arrival date & time: 12/16/22  1028      History   Chief Complaint Chief Complaint  Patient presents with   Cough   Nasal Congestion    HPI Tyler Wu is a 77 y.o. male.   Patient presents with persistent and worsening cough and nasal congestion that has been present for about 2 weeks.  Patient was seen on 12/08/2022 at urgent care and prescribed Augmentin.  Reports that he has taken medication with no improvement in symptoms.  His wife has had similar symptoms.  Denies chest pain or shortness of breath.  Cough is productive.  Denies history of asthma or COPD.   Cough   Past Medical History:  Diagnosis Date   Allergy    Aortic atherosclerosis (HCC) 10/2019   per CT chest   BPH (benign prostatic hyperplasia)    CLL (chronic lymphocytic leukemia) (HCC) 2005   followed by oncology yearly.   History of COVID-19 2021   Hypertension 2024    Patient Active Problem List   Diagnosis Date Noted   Essential hypertension, benign 05/25/2022   Benign prostatic hyperplasia with urinary frequency 12/05/2021   Thrombocytopenia (HCC) 11/25/2020   Injury of nail bed of finger 11/25/2020   Vitamin D deficiency 08/26/2020   Encounter for health maintenance examination in adult 05/02/2020   Medicare annual wellness visit, subsequent 05/02/2020   Screening for lipid disorders 05/02/2020   Vaccine counseling 05/02/2020   Advanced directives, counseling/discussion 05/02/2020   Enlarged prostate 03/08/2020   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   Urinary urgency 03/08/2020   History of COVID-19 11/13/2019   Aortic atherosclerosis (HCC) 11/03/2019   History of skin cancer 11/24/2016   Lymphadenopathy of head and neck 11/22/2014   Multiple skin nodules 11/22/2013   Lipomatosis 05/14/2011   CLL (chronic lymphocytic leukemia) (HCC) 03/24/2011    Past Surgical History:  Procedure Laterality Date   COLONOSCOPY     around 2012   HERNIA  REPAIR  1996       Home Medications    Prior to Admission medications   Medication Sig Start Date End Date Taking? Authorizing Provider  aspirin 81 MG chewable tablet Chew 81 mg by mouth.   Yes [provider]  azithromycin (ZITHROMAX Z-PAK) 250 MG tablet Take 2 tablets (500 mg total) by mouth daily for 1 day, THEN 1 tablet (250 mg total) daily for 4 days. 12/16/22 12/21/22 Yes Willard Farquharson, Acie Fredrickson, FNP  benzonatate (TESSALON) 100 MG capsule Take 1 capsule (100 mg total) by mouth every 8 (eight) hours as needed for cough. 12/08/22  Yes Naleigha Raimondi, Rolly Salter E, FNP  fluticasone (FLONASE) 50 MCG/ACT nasal spray PLACE 2 SPRAYS INTO BOTH NOSTRILS DAILY FOR 3 TO 5 DAYS, THEN DECREASE TO 1 SPRAY DAILY 11/04/22  Yes Tysinger, Kermit Balo, PA-C  tamsulosin (FLOMAX) 0.4 MG CAPS capsule TAKE 1 CAPSULE(0.4 MG) BY MOUTH DAILY AFTER SUPPER 12/05/21  Yes Tysinger, Kermit Balo, PA-C  Vitamin D, Ergocalciferol, (DRISDOL) 1.25 MG (50000 UNIT) CAPS capsule Take 1 capsule (50,000 Units total) by mouth every 7 (seven) days. 09/22/22  Yes Tysinger, Kermit Balo, PA-C  cetirizine (ZYRTEC ALLERGY) 10 MG tablet Take 1 tablet (10 mg total) by mouth at bedtime. Patient not taking: Reported on 12/16/2022 05/27/22 11/23/22  Tysinger, Kermit Balo, PA-C    Family History Family History  Problem Relation Age of Onset   Cancer Father 72       melonoma   Heart attack Maternal  Grandmother    Hypertension Maternal Grandmother    Heart attack Maternal Grandfather    Hypertension Maternal Grandfather    Kidney disease Brother     Social History Social History   Tobacco Use   Smoking status: Former    Current packs/day: 0.00    Types: Cigarettes    Quit date: 01/26/1974    Years since quitting: 48.9    Passive exposure: Past   Smokeless tobacco: Never   Tobacco comments:    Quit smoking 1976  Vaping Use   Vaping status: Never Used  Substance Use Topics   Alcohol use: No   Drug use: No     Allergies   Levofloxacin   Review of  Systems Review of Systems Per HPI  Physical Exam Triage Vital Signs ED Triage Vitals  Encounter Vitals Group     BP 12/16/22 1142 (!) 160/82     Systolic BP Percentile --      Diastolic BP Percentile --      Pulse Rate 12/16/22 1142 62     Resp 12/16/22 1142 16     Temp 12/16/22 1142 98.3 F (36.8 C)     Temp Source 12/16/22 1142 Oral     SpO2 12/16/22 1142 98 %     Weight --      Height --      Head Circumference --      Peak Flow --      Pain Score 12/16/22 1143 0     Pain Loc --      Pain Education --      Exclude from Growth Chart --    No data found.  Updated Vital Signs BP (!) 160/82 (BP Location: Left Arm)   Pulse 62   Temp 98.3 F (36.8 C) (Oral)   Resp 16   SpO2 98%   Visual Acuity Right Eye Distance:   Left Eye Distance:   Bilateral Distance:    Right Eye Near:   Left Eye Near:    Bilateral Near:     Physical Exam Constitutional:      General: He is not in acute distress.    Appearance: Normal appearance. He is not toxic-appearing or diaphoretic.  HENT:     Head: Normocephalic and atraumatic.     Right Ear: Tympanic membrane and ear canal normal.     Left Ear: Tympanic membrane and ear canal normal.     Nose: Congestion present.     Mouth/Throat:     Mouth: Mucous membranes are moist.     Pharynx: No posterior oropharyngeal erythema.  Eyes:     Extraocular Movements: Extraocular movements intact.     Conjunctiva/sclera: Conjunctivae normal.     Pupils: Pupils are equal, round, and reactive to light.  Cardiovascular:     Rate and Rhythm: Normal rate and regular rhythm.     Pulses: Normal pulses.     Heart sounds: Normal heart sounds.  Pulmonary:     Effort: Pulmonary effort is normal. No respiratory distress.     Breath sounds: Normal breath sounds. No stridor. No wheezing, rhonchi or rales.  Abdominal:     General: Abdomen is flat. Bowel sounds are normal.     Palpations: Abdomen is soft.  Musculoskeletal:        General: Normal range  of motion.     Cervical back: Normal range of motion.  Skin:    General: Skin is warm and dry.  Neurological:     General:  No focal deficit present.     Mental Status: He is alert and oriented to person, place, and time. Mental status is at baseline.  Psychiatric:        Mood and Affect: Mood normal.        Behavior: Behavior normal.      UC Treatments / Results  Labs (all labs ordered are listed, but only abnormal results are displayed) Labs Reviewed - No data to display  EKG   Radiology DG Chest 2 View  Result Date: 12/16/2022 CLINICAL DATA:  Productive cough for the past 2 weeks. EXAM: CHEST - 2 VIEW COMPARISON:  11/17/2019 FINDINGS: Unchanged cardiac silhouette and mediastinal contours given reduced lung volumes. Interval development of heterogeneous airspace opacities within the right lower lung with less well-defined nodular airspace opacities within the left lower lung. No pleural effusion or pneumothorax. No evidence of edema. No acute osseous abnormalities. IMPRESSION: Findings suggestive of multifocal pneumonia, potentially of atypical etiology, right greater than left. A follow-up chest radiograph in 3 to 4 weeks after treatment is recommended to ensure resolution. Electronically Signed   By: Simonne Come M.D.   On: 12/16/2022 16:51    Procedures Procedures (including critical care time)  Medications Ordered in UC Medications - No data to display  Initial Impression / Assessment and Plan / UC Course  I have reviewed the triage vital signs and the nursing notes.  Pertinent labs & imaging results that were available during my care of the patient were reviewed by me and considered in my medical decision making (see chart for details).     Chest x-ray completed that was concerning for multifocal pneumonia.  Patient was already treated with Augmentin last week and azithromycin was prescribed at this urgent care visit which should be suitable for treatment.  Encouraged  strict follow-up precautions.  Attempted to call patient to notify of x-ray results but there was no answer.  Left voicemail for call back per privacy policy.  Patient will need to return to PCP or urgent care in 4 to 6 weeks to ensure infection is clear with repeat chest x-ray. Final Clinical Impressions(s) / UC Diagnoses   Final diagnoses:  Acute upper respiratory infection  Persistent cough     Discharge Instructions      Please go to [facility] imaging  today to have x-ray completed of your chest.  I have prescribed you azithromycin antibiotic.  Follow-up with primary care doctor if symptoms persist or worsen.    ED Prescriptions     Medication Sig Dispense Auth. Provider   azithromycin (ZITHROMAX Z-PAK) 250 MG tablet Take 2 tablets (500 mg total) by mouth daily for 1 day, THEN 1 tablet (250 mg total) daily for 4 days. 6 tablet [place], Acie Fredrickson, [place]      PDMP not reviewed this encounter.   Gustavus Bryant, [place] 12/17/22 [phone]

## 2022-12-16 NOTE — Discharge Instructions (Addendum)
Please go to [facility] imaging  today to have x-ray completed of your chest.  I have prescribed you azithromycin antibiotic.  Follow-up with primary care doctor if symptoms persist or worsen.

## 2022-12-17 ENCOUNTER — Telehealth (HOSPITAL_COMMUNITY): Payer: Self-pay | Admitting: Internal Medicine

## 2022-12-17 NOTE — Telephone Encounter (Signed)
Attempted to call patient to notify him of x-ray results but there was no answer.  Left voicemail for call back per privacy policy.  No change to treatment plan at this time.  See urgent care visit note.

## 2022-12-18 ENCOUNTER — Other Ambulatory Visit: Payer: Self-pay | Admitting: Medical

## 2022-12-22 ENCOUNTER — Other Ambulatory Visit: Payer: Self-pay | Admitting: Medical

## 2022-12-22 MED ORDER — VALSARTAN 40 MG PO TABS: 40.0000 mg | ORAL_TABLET | Freq: Every day | ORAL | 3 refills | Status: DC

## 2022-12-22 MED ORDER — TAMSULOSIN HCL 0.4 MG PO CAPS: ORAL_CAPSULE | ORAL | 0 refills | Status: DC

## 2022-12-23 ENCOUNTER — Other Ambulatory Visit: Payer: Self-pay | Admitting: Medical

## 2022-12-27 ENCOUNTER — Other Ambulatory Visit: Payer: Self-pay | Admitting: Medical

## 2022-12-30 DIAGNOSIS — L538 Other specified erythematous conditions: Secondary | ICD-10-CM | POA: Diagnosis not present

## 2022-12-30 DIAGNOSIS — Z789 Other specified health status: Secondary | ICD-10-CM | POA: Diagnosis not present

## 2022-12-30 DIAGNOSIS — R208 Other disturbances of skin sensation: Secondary | ICD-10-CM | POA: Diagnosis not present

## 2022-12-30 DIAGNOSIS — L820 Inflamed seborrheic keratosis: Secondary | ICD-10-CM | POA: Diagnosis not present

## 2023-01-11 ENCOUNTER — Other Ambulatory Visit: Payer: Self-pay

## 2023-01-11 ENCOUNTER — Encounter: Payer: Self-pay | Admitting: Emergency Medicine

## 2023-01-11 ENCOUNTER — Ambulatory Visit
Admission: EM | Admit: 2023-01-11 | Discharge: 2023-01-11 | Disposition: A | Discharge status: Home / Self Care | Payer: Medicare HMO

## 2023-01-11 DIAGNOSIS — J019 Acute sinusitis, unspecified: Secondary | ICD-10-CM | POA: Diagnosis not present

## 2023-01-11 DIAGNOSIS — C9110 Chronic lymphocytic leukemia of B-cell type not having achieved remission: Secondary | ICD-10-CM

## 2023-01-11 LAB — POC COVID19/FLU A&B COMBO
Covid Antigen, POC: NEGATIVE
Influenza A Antigen, POC: NEGATIVE
Influenza B Antigen, POC: NEGATIVE

## 2023-01-11 MED ORDER — AMOXICILLIN-POT CLAVULANATE 875-125 MG PO TABS: 1.0000 | ORAL_TABLET | Freq: Two times a day (BID) | ORAL | 0 refills | Status: DC

## 2023-01-11 NOTE — ED Provider Notes (Signed)
[name]    CSN: [id number] Arrival date & time: 01/11/23  1546      History   Chief Complaint Chief Complaint  Patient presents with   Nasal Congestion   Cough    HPI Tyler Wu is a 77 y.o. male.   Patient here today for evaluation of nasal congestion, sinus pressure and dry cough has had for 5 days.  He has had some Coricidin at home which has helped somewhat.  He also reports some ear fullness and pressure.  He notes that his nasal discharge has changed to a greenish in color.  He took a COVID test yesterday that was negative at home.  He has not had any vomiting or diarrhea.  He denies known fever.  He does have CLL.   The history is provided by the patient.  Cough Associated symptoms: sore throat   Associated symptoms: no ear pain, no eye discharge, no fever and no shortness of breath     Past Medical History:  Diagnosis Date   Allergy    Aortic atherosclerosis (HCC) 10/2019   per CT chest   BPH (benign prostatic hyperplasia)    CLL (chronic lymphocytic leukemia) (HCC) 2005   followed by oncology yearly.   History of COVID-19 2021   Hypertension 2024    Patient Active Problem List   Diagnosis Date Noted   Essential hypertension, benign 05/25/2022   Benign prostatic hyperplasia with urinary frequency 12/05/2021   Thrombocytopenia (HCC) 11/25/2020   Injury of nail bed of finger 11/25/2020   Vitamin D deficiency 08/26/2020   Encounter for health maintenance examination in adult 05/02/2020   Medicare annual wellness visit, subsequent 05/02/2020   Screening for lipid disorders 05/02/2020   Vaccine counseling 05/02/2020   Advanced directives, counseling/discussion 05/02/2020   Enlarged prostate 03/08/2020   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   Urinary urgency 03/08/2020   History of COVID-19 11/13/2019   Aortic atherosclerosis (HCC) 11/03/2019   History of skin cancer 11/24/2016   Lymphadenopathy of head and neck 11/22/2014   Multiple  skin nodules 11/22/2013   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 Authorizing Provider  amoxicillin-clavulanate (AUGMENTIN) 875-125 MG tablet Take 1 tablet by mouth every 12 (twelve) hours. 01/11/23  Yes Tomi Bamberger, PA-C  aspirin 81 MG chewable tablet Chew 81 mg by mouth.   Yes [provider]  benzonatate (TESSALON) 100 MG capsule Take 1 capsule (100 mg total) by mouth every 8 (eight) hours as needed for cough. Patient not taking: Reported on 01/11/2023 12/08/22   Gustavus Bryant, FNP  cetirizine (ZYRTEC ALLERGY) 10 MG tablet Take 1 tablet (10 mg total) by mouth at bedtime. Patient not taking: Reported on 12/16/2022 05/27/22 11/23/22  Tysinger, Kermit Balo, PA-C  fluticasone (FLONASE) 50 MCG/ACT nasal spray PLACE 2 SPRAYS INTO BOTH NOSTRILS DAILY FOR 3 TO 5 DAYS, THEN DECREASE TO 1 SPRAY DAILY 12/28/22  Yes Tysinger, Kermit Balo, PA-C  tamsulosin (FLOMAX) 0.4 MG CAPS capsule TAKE 1 CAPSULE(0.4 MG) BY MOUTH DAILY AFTER SUPPER 12/22/22  Yes Tysinger, Kermit Balo, PA-C  tamsulosin (FLOMAX) 0.4 MG CAPS capsule 1 tablet Orally once a day Patient not taking: Reported on 01/11/2023  [provider]  valsartan (DIOVAN) 40 MG tablet Take 1 tablet (40 mg total) by mouth daily. Patient not taking: Reported on 01/11/2023 12/22/22   Tysinger, Kermit Balo, PA-C  Vitamin D, Ergocalciferol, (DRISDOL) 1.25 MG (50000 UNIT) CAPS capsule Take 1 capsule (50,000 Units total) by mouth every 7 (seven) days. 09/22/22  Yes Tysinger, Kermit Balo, PA-C    Family History Family History  Problem Relation Age of Onset   Cancer Father 60       melonoma   Heart attack Maternal Grandmother    Hypertension Maternal Grandmother    Heart attack Maternal Grandfather     Hypertension Maternal Grandfather    Kidney disease Brother     Social History Social History   Tobacco Use   Smoking status: Former    Current packs/day: 0.00    Types: Cigarettes    Quit date: 01/26/1974    Years since quitting: 48.9    Passive exposure: Past   Smokeless tobacco: Never   Tobacco comments:    Quit smoking 1976  Vaping Use   Vaping status: Never Used  Substance Use Topics   Alcohol use: No   Drug use: No     Allergies   Levofloxacin   Review of Systems Review of Systems  Constitutional:  Negative for fever.  HENT:  Positive for congestion and sore throat. Negative for ear pain.   Eyes:  Negative for discharge and redness.  Respiratory:  Positive for cough. Negative for shortness of breath.   Gastrointestinal:  Negative for abdominal pain, nausea and vomiting.     Physical Exam Triage Vital Signs ED Triage Vitals  Encounter Vitals Group     BP 01/11/23 1721 (!) 168/83     Systolic BP Percentile --      Diastolic BP Percentile --      Pulse Rate 01/11/23 1721 68     Resp 01/11/23 1721 16     Temp 01/11/23 1721 98.5 F (36.9 C)     Temp Source 01/11/23 1721 Oral     SpO2 01/11/23 1721 95 %     Weight --      Height --      Head Circumference --      Peak Flow --      Pain Score 01/11/23 1724 5     Pain Loc --      Pain Education --      Exclude from Growth Chart --    No data found.  Updated Vital Signs BP (!) 168/83 (BP Location: Left Arm)   Pulse 68   Temp 98.5 F (36.9 C) (Oral)   Resp 16   SpO2 95%   Visual Acuity Right Eye Distance:   Left Eye Distance:   Bilateral Distance:    Right Eye Near:   Left Eye Near:    Bilateral Near:     Physical Exam Vitals and nursing note reviewed.  Constitutional:      General: He is not in acute distress.    Appearance: Normal appearance. He is not ill-appearing.  HENT:     Head: Normocephalic and atraumatic.     Nose: Congestion present.     Mouth/Throat:     Mouth: Mucous  membranes are moist.     Pharynx: Oropharynx is clear. No oropharyngeal exudate or posterior oropharyngeal erythema.  Eyes:     Conjunctiva/sclera: Conjunctivae normal.  Cardiovascular:     Rate and Rhythm: Normal rate and regular rhythm.  Heart sounds: Normal heart sounds. No murmur heard. Pulmonary:     Effort: Pulmonary effort is normal. No respiratory distress.     Breath sounds: Normal breath sounds. No wheezing, rhonchi or rales.  Skin:    General: Skin is warm and dry.  Neurological:     Mental Status: He is alert.  Psychiatric:        Mood and Affect: Mood normal.        Thought Content: Thought content normal.      UC Treatments / Results  Labs (all labs ordered are listed, but only abnormal results are displayed) Labs Reviewed  POC COVID19/FLU A&B COMBO - Normal    EKG   Radiology No results found.  Procedures Procedures (including critical care time)  Medications Ordered in UC Medications - No data to display  Initial Impression / Assessment and Plan / UC Course  I have reviewed the triage vital signs and the nursing notes.  Pertinent labs & imaging results that were available during my care of the patient were reviewed by me and considered in my medical decision making (see chart for details).    Flu and COVID screening negative.  Suspect likely upper respiratory infection will treat to cover sinusitis given CLL Augmentin prescribed.  Encouraged follow-up if no gradual improvement or with any further concerns.  Patient expresses understanding.  Final Clinical Impressions(s) / UC Diagnoses   Final diagnoses:  Acute sinusitis, recurrence not specified, unspecified location  CLL (chronic lymphocytic leukemia) (HCC)   Discharge Instructions   None    ED Prescriptions     Medication Sig Dispense Auth. Provider   amoxicillin-clavulanate (AUGMENTIN) 875-125 MG tablet Take 1 tablet by mouth every 12 (twelve) hours. 14 tablet Tomi Bamberger, PA-C       PDMP not reviewed this encounter.   Tomi Bamberger, PA-C 01/11/23 1911

## 2023-01-11 NOTE — ED Triage Notes (Signed)
Pt reports nasal congestion and dry cough x5 days. Some relief with coricidin at home. Pt reports ear fullness and pressure L ear>R ear. At home Covid test negative yesterday.

## 2023-01-25 ENCOUNTER — Other Ambulatory Visit: Payer: Self-pay | Admitting: Medical

## 2023-01-26 ENCOUNTER — Telehealth: Payer: Medicare HMO | Admitting: Physician Assistant

## 2023-01-26 DIAGNOSIS — J208 Acute bronchitis due to other specified organisms: Secondary | ICD-10-CM

## 2023-01-26 DIAGNOSIS — B9689 Other specified bacterial agents as the cause of diseases classified elsewhere: Secondary | ICD-10-CM

## 2023-01-26 MED ORDER — AZITHROMYCIN 250 MG PO TABS: ORAL_TABLET | ORAL | 0 refills | Status: AC

## 2023-01-26 MED ORDER — BENZONATATE 100 MG PO CAPS: 100.0000 mg | ORAL_CAPSULE | Freq: Three times a day (TID) | ORAL | 0 refills | Status: DC | PRN

## 2023-01-26 NOTE — Progress Notes (Signed)

## 2023-01-28 ENCOUNTER — Encounter: Payer: Self-pay | Admitting: Family Medicine

## 2023-01-28 NOTE — Telephone Encounter (Signed)
 Letter sent to patient.

## 2023-02-01 ENCOUNTER — Ambulatory Visit
Admission: EM | Admit: 2023-02-01 | Discharge: 2023-02-01 | Disposition: A | Discharge status: Home / Self Care | Payer: Medicare HMO | Attending: Internal Medicine | Admitting: Internal Medicine

## 2023-02-01 ENCOUNTER — Telehealth: Payer: Medicare HMO | Admitting: Family Medicine

## 2023-02-01 DIAGNOSIS — R058 Other specified cough: Secondary | ICD-10-CM | POA: Diagnosis not present

## 2023-02-01 DIAGNOSIS — R053 Chronic cough: Secondary | ICD-10-CM

## 2023-02-01 MED ORDER — PROMETHAZINE-DM 6.25-15 MG/5ML PO SYRP: 5.0000 mL | ORAL_SOLUTION | Freq: Three times a day (TID) | ORAL | 0 refills | Status: DC | PRN

## 2023-02-01 MED ORDER — PREDNISONE 10 MG PO TABS: 40.0000 mg | ORAL_TABLET | Freq: Every day | ORAL | 0 refills | Status: AC

## 2023-02-01 NOTE — ED Triage Notes (Addendum)
 Patient reports bronchitis, states e visit on 12/31, Z pack and Tessalon . Symptoms of cough, low back pain and fatigue x 1 week. Patient states he feels a little better but not fully recovered. Treating with Coricidin. States when he takes deep breathe, it triggers his cough.

## 2023-02-01 NOTE — ED Provider Notes (Signed)
 [name]    CSN: [id number] Arrival date & time: 02/01/23  1507      History   Chief Complaint No chief complaint on file.   HPI Tyler Wu is a 78 y.o. male.   78 year old male who presents to urgent care with complaints of persistent cough and fatigue.  He has had the cough since the first part of November.  Initially this was much worse and he had other associated symptoms including congestion, shortness of breath, fatigue.  At that time he was diagnosed with possible pneumonia and treated with Augmentin .  He continued to have symptoms and presented again and was started on another round of antibiotics.  About 2 to 3 weeks later he presented again with persistent symptoms and was started on azithromycin  and Tessalon  pearls.  He reports that his symptoms are better now but he continues to have a cough.  He denies shortness of breath, wheezing, nausea, vomiting, congestion.  The cough does have some production at times.  He is somewhat fatigued from coughing so much and is having some lower back pain from this as well.  He denies any fevers or chills.     Past Medical History:  Diagnosis Date   Allergy    Aortic atherosclerosis (HCC) 10/2019   per CT chest   BPH (benign prostatic hyperplasia)    CLL (chronic lymphocytic leukemia) (HCC) 2005   followed by oncology yearly.   History of COVID-19 2021   Hypertension 2024    Patient Active Problem List   Diagnosis Date Noted   Essential hypertension, benign 05/25/2022   Benign prostatic hyperplasia with urinary frequency 12/05/2021   Thrombocytopenia (HCC) 11/25/2020   Injury of nail bed of finger 11/25/2020   Vitamin D  deficiency 08/26/2020   Encounter for health maintenance examination in adult 05/02/2020   Medicare annual wellness visit, subsequent 05/02/2020   Screening for lipid disorders 05/02/2020   Vaccine counseling 05/02/2020   Advanced directives, counseling/discussion 05/02/2020   Enlarged  prostate 03/08/2020   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   Urinary urgency 03/08/2020   History of COVID-19 11/13/2019   Aortic atherosclerosis (HCC) 11/03/2019   History of skin cancer 11/24/2016   Lymphadenopathy of head and neck 11/22/2014   Multiple skin nodules 11/22/2013   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 Authorizing Provider  predniSONE  (DELTASONE ) 10 MG tablet Take 4 tablets (40 mg total) by mouth daily with breakfast for 5 days. 02/01/23 02/06/23 Yes Lashawnda Hancox A, PA-C  promethazine -dextromethorphan (PROMETHAZINE -DM) 6.25-15 MG/5ML syrup Take 5 mLs by mouth every 8 (eight) hours as needed for cough. 02/01/23  Yes Pawel Soules A, PA-C  aspirin 81 MG chewable tablet Chew 81 mg by mouth.    [provider]  benzonatate  (TESSALON ) 100 MG capsule Take 1-2 capsules (100-200 mg total) by mouth 3 (three) times daily as needed. 01/26/23   Vivienne Delon HERO, PA-C  cetirizine  (ZYRTEC  ALLERGY) 10 MG tablet Take 1 tablet (10 mg total) by mouth at bedtime. Patient not taking: Reported on 12/16/2022 05/27/22 11/23/22  Tysinger, Alm RAMAN, PA-C  fluticasone  (FLONASE ) 50 MCG/ACT nasal spray PLACE 2 SPRAYS INTO BOTH NOSTRILS DAILY FOR 3 TO 5 DAYS, THEN  DECREASE TO 1 SPRAY DAILY 12/28/22   Tysinger, Alm RAMAN, PA-C  tamsulosin  (FLOMAX ) 0.4 MG CAPS capsule TAKE 1 CAPSULE(0.4 MG) BY MOUTH DAILY AFTER SUPPER 12/22/22   Tysinger, Alm RAMAN, PA-C  valsartan  (DIOVAN ) 40 MG tablet Take 1 tablet (40 mg total) by mouth daily. Patient not taking: Reported on 01/11/2023 12/22/22   Tysinger, Alm RAMAN, PA-C  Vitamin D , Ergocalciferol , (DRISDOL ) 1.25 MG (50000 UNIT) CAPS capsule TAKE 1 CAPSULE BY MOUTH EVERY 7 DAYS 01/25/23   Tysinger, Alm RAMAN, PA-C     Family History Family History  Problem Relation Age of Onset   Cancer Father 29       melonoma   Heart attack Maternal Grandmother    Hypertension Maternal Grandmother    Heart attack Maternal Grandfather    Hypertension Maternal Grandfather    Kidney disease Brother     Social History Social History   Tobacco Use   Smoking status: Former    Current packs/day: 0.00    Types: Cigarettes    Quit date: 01/26/1974    Years since quitting: 49.0    Passive exposure: Past   Smokeless tobacco: Never   Tobacco comments:    Quit smoking 1976  Vaping Use   Vaping status: Never Used  Substance Use Topics   Alcohol use: No   Drug use: No     Allergies   Levofloxacin   Review of Systems Review of Systems  Constitutional:  Positive for fatigue. Negative for chills and fever.  HENT:  Negative for ear pain and sore throat.   Eyes:  Negative for pain and visual disturbance.  Respiratory:  Positive for cough. Negative for shortness of breath.   Cardiovascular:  Negative for chest pain and palpitations.  Gastrointestinal:  Negative for abdominal pain and vomiting.  Genitourinary:  Negative for dysuria and hematuria.  Musculoskeletal:  Positive for back pain. Negative for arthralgias.  Skin:  Negative for color change and rash.  Neurological:  Negative for seizures and syncope.  All other systems reviewed and are negative.    Physical Exam Triage Vital Signs ED Triage Vitals  Encounter Vitals Group     BP 02/01/23 1559 (!) 146/83     Systolic BP Percentile --      Diastolic BP Percentile --      Pulse Rate 02/01/23 1559 77     Resp 02/01/23 1559 16     Temp 02/01/23 1559 99 F (37.2 C)     Temp Source 02/01/23 1559 Oral     SpO2 02/01/23 1559 98 %     Weight 02/01/23 1557 150 lb (68 kg)     Height 02/01/23 1557 5' 7 (1.702 m)     Head Circumference --      Peak Flow --      Pain Score 02/01/23 1556 3     Pain Loc --      Pain Education --      Exclude from  Growth Chart --    No data found.  Updated Vital Signs BP (!) 146/83 (BP Location: Left Arm)   Pulse 77   Temp 99 F (37.2 C) (Oral)   Resp 16   Ht 5' 7 (1.702 m)   Wt 150 lb (68 kg)   SpO2 98%   BMI 23.49 kg/m   Visual Acuity Right Eye Distance:   Left Eye Distance:   Bilateral Distance:    Right Eye Near:   Left Eye Near:    Bilateral Near:  Physical Exam Vitals and nursing note reviewed.  Constitutional:      General: He is not in acute distress.    Appearance: He is well-developed.  HENT:     Head: Normocephalic and atraumatic.     Right Ear: Tympanic membrane normal.     Left Ear: Tympanic membrane normal.     Nose: Nose normal.     Mouth/Throat:     Mouth: Mucous membranes are moist.     Pharynx: No posterior oropharyngeal erythema.  Eyes:     Conjunctiva/sclera: Conjunctivae normal.  Cardiovascular:     Rate and Rhythm: Normal rate and regular rhythm.     Heart sounds: No murmur heard. Pulmonary:     Effort: Pulmonary effort is normal. No respiratory distress.     Breath sounds: Normal breath sounds. No decreased breath sounds, wheezing or rhonchi.  Abdominal:     Palpations: Abdomen is soft.     Tenderness: There is no abdominal tenderness.  Musculoskeletal:        General: No swelling.     Cervical back: Neck supple.  Skin:    General: Skin is warm and dry.     Capillary Refill: Capillary refill takes less than 2 seconds.  Neurological:     Mental Status: He is alert.  Psychiatric:        Mood and Affect: Mood normal.      UC Treatments / Results  Labs (all labs ordered are listed, but only abnormal results are displayed) Labs Reviewed - No data to display  EKG   Radiology No results found.  Procedures Procedures (including critical care time)  Medications Ordered in UC Medications - No data to display  Initial Impression / Assessment and Plan / UC Course  I have reviewed the triage vital signs and the nursing  notes.  Pertinent labs & imaging results that were available during my care of the patient were reviewed by me and considered in my medical decision making (see chart for details).     Post-viral cough syndrome   Symptoms most consistent with post-illness cough and inflammation. Your lungs are clear and do not feel this is pneumonia. We will treat this with the following:  Prednisone  40 mg daily for 5 days. Take this in the morning. This helps with inflammation and pain. Promethazine  DM 5 mL every 8 hours as needed for cough.  Use caution as this medication can cause drowsiness. Restart zyrtec  daily Stay hydrated and rest when you need to Return to urgent care or PCP if symptoms worsen or fail to resolve.    Final Clinical Impressions(s) / UC Diagnoses   Final diagnoses:  Post-viral cough syndrome     Discharge Instructions      Symptoms most consistent with post-illness cough and inflammation. Your lungs are clear and do not feel this is pneumonia. We will treat this with the following:  Prednisone  40 mg daily for 5 days. Take this in the morning. This helps with inflammation and pain. Promethazine  DM 5 mL every 8 hours as needed for cough.  Use caution as this medication can cause drowsiness. Restart zyrtec  daily Stay hydrated and rest when you need to Return to urgent care or PCP if symptoms worsen or fail to resolve.     ED Prescriptions     Medication Sig Dispense Auth. Provider   predniSONE  (DELTASONE ) 10 MG tablet Take 4 tablets (40 mg total) by mouth daily with breakfast for 5 days. 20 tablet Teresa Almarie LABOR,  PA-C   promethazine -dextromethorphan (PROMETHAZINE -DM) 6.25-15 MG/5ML syrup Take 5 mLs by mouth every 8 (eight) hours as needed for cough. 180 mL Teresa Almarie LABOR, [place]      PDMP not reviewed this encounter.   Teresa Almarie LABOR, PA-C 02/01/23 1638

## 2023-02-01 NOTE — Discharge Instructions (Addendum)
 Symptoms most consistent with post-illness cough and inflammation and possibly mild bronchitis. This does not need any further antibiotics. Your lungs are clear and do not feel this is pneumonia. We will treat this with the following:  Prednisone  40 mg daily for 5 days. Take this in the morning. This helps with inflammation and pain. Promethazine  DM 5 mL every 8 hours as needed for cough.  Use caution as this medication can cause drowsiness. Restart zyrtec  daily Stay hydrated and rest when you need to Return to urgent care or PCP if symptoms worsen or fail to resolve.

## 2023-02-01 NOTE — Progress Notes (Signed)
 Because you are having on going symptoms after treatment we need to have you seen in person. Your condition warrants further evaluation and I recommend that you be seen in a face to face visit at your PCP office or local urgent care.   NOTE: There will be NO CHARGE for this eVisit   If you are having a true medical emergency please call 911.

## 2023-02-25 ENCOUNTER — Telehealth: Payer: Self-pay | Admitting: Internal Medicine

## 2023-02-25 ENCOUNTER — Encounter: Payer: Self-pay | Admitting: Internal Medicine

## 2023-02-25 ENCOUNTER — Other Ambulatory Visit: Payer: Self-pay | Admitting: Medical

## 2023-02-25 NOTE — Telephone Encounter (Signed)
Pt is due for a visit with Vincenza Hews. Please call patient to schedule

## 2023-02-26 NOTE — Telephone Encounter (Signed)
Pt has appt 05/25/23

## 2023-03-30 ENCOUNTER — Ambulatory Visit: Payer: Medicare HMO

## 2023-03-30 DIAGNOSIS — Z0000 Encounter for general adult medical examination without abnormal findings: Secondary | ICD-10-CM

## 2023-03-30 NOTE — Progress Notes (Signed)
 Subjective:   Tyler Wu is a 78 y.o. who presents for a Medicare Wellness preventive visit.  Visit Complete: Virtual I connected with  Tyler Wu on 03/30/23 by a audio enabled telemedicine application and verified that I am speaking with the correct person using two identifiers.  Patient Location: Home  Provider Location: Office/Clinic  I discussed the limitations of evaluation and management by telemedicine. The patient expressed understanding and agreed to proceed.  Vital Signs: Because this visit was a virtual/telehealth visit, some criteria may be missing or patient reported. Any vitals not documented were not able to be obtained and vitals that have been documented are patient reported.  VideoError- Librarian, academic were attempted between this provider and patient, however failed, due to patient having technical difficulties OR patient did not have access to video capability.  We continued and completed visit with audio only.   AWV Questionnaire: No: Patient Medicare AWV questionnaire was not completed prior to this visit.  Cardiac Risk Factors include: advanced age (>33men, >61 women);male gender     Objective:    Today's Vitals   There is no height or weight on file to calculate BMI.     03/30/2023    2:54 PM 02/01/2023    3:58 PM 03/24/2022    2:41 PM 03/14/2021    9:57 AM 07/06/2018    8:02 AM 03/30/2017    8:52 AM 11/21/2015    1:34 PM  Advanced Directives  Does Patient Have a Medical Advance Directive? Yes Yes Yes Yes No;Yes Yes No  Type of Estate agent of [place];Living will  Healthcare Power of [place];Living will Healthcare Power of [place];Living will Living will;Healthcare Power of Attorney    Does patient want to make changes to medical advance directive?     No - Patient declined Yes (MAU/Ambulatory/Procedural Areas - Information given)   Copy of Healthcare Power of Attorney in Chart? No - copy  requested  No - copy requested No - copy requested No - copy requested    Would patient like information on creating a medical advance directive?     No - Patient declined  No - patient declined information    Current Medications (verified) Outpatient Encounter Medications as of 03/30/2023  Medication Sig   aspirin 81 MG chewable tablet Chew 81 mg by mouth.   fluticasone (FLONASE) 50 MCG/ACT nasal spray PLACE 2 SPRAYS INTO BOTH NOSTRILS DAILY FOR 3 TO 5 DAYS, THEN DECREASE TO 1 SPRAY DAILY   Multiple Vitamins-Minerals (CENTRUM SILVER 50+MEN PO) Take 1 tablet by mouth daily.   tamsulosin (FLOMAX) 0.4 MG CAPS capsule TAKE 1 CAPSULE(0.4 MG) BY MOUTH DAILY AFTER SUPPER   Vitamin D, Ergocalciferol, (DRISDOL) 1.25 MG (50000 UNIT) CAPS capsule TAKE 1 CAPSULE BY MOUTH EVERY 7 DAYS   benzonatate (TESSALON) 100 MG capsule Take 1-2 capsules (100-200 mg total) by mouth 3 (three) times daily as needed. (Patient not taking: Reported on 03/30/2023)   cetirizine (ZYRTEC ALLERGY) 10 MG tablet Take 1 tablet (10 mg total) by mouth at bedtime. (Patient not taking: Reported on 12/16/2022)   promethazine-dextromethorphan (PROMETHAZINE-DM) 6.25-15 MG/5ML syrup Take 5 mLs by mouth every 8 (eight) hours as needed for cough. (Patient not taking: Reported on 03/30/2023)   valsartan (DIOVAN) 40 MG tablet Take 1 tablet (40 mg total) by mouth daily. (Patient not taking: Reported on 01/11/2023)   No facility-administered encounter medications on file as of 03/30/2023.    Allergies (verified) Levofloxacin   History:  Past Medical History:  Diagnosis Date   Allergy    Aortic atherosclerosis (HCC) 10/2019   per CT chest   BPH (benign prostatic hyperplasia)    CLL (chronic lymphocytic leukemia) (HCC) 2005   followed by oncology yearly.   History of COVID-19 2021   Hypertension 2024   Past Surgical History:  Procedure Laterality Date   COLONOSCOPY     around 2012   HERNIA REPAIR  1996   Family History  Problem Relation  Age of Onset   Cancer Father 28       melonoma   Heart attack Maternal Grandmother    Hypertension Maternal Grandmother    Heart attack Maternal Grandfather    Hypertension Maternal Grandfather    Kidney disease Brother    Social History   Socioeconomic History   Marital status: Married    Spouse name: Not on file   Number of children: 2   Years of education: Not on file   Highest education level: Associate degree: academic program  Occupational History   Occupation: Technical brewer  Tobacco Use   Smoking status: Former    Current packs/day: 0.00    Types: Cigarettes    Quit date: 01/26/1974    Years since quitting: 49.2    Passive exposure: Past   Smokeless tobacco: Never   Tobacco comments:    Quit smoking 1976  Vaping Use   Vaping status: Never Used  Substance and Sexual Activity   Alcohol use: No   Drug use: No   Sexual activity: Yes    Birth control/protection: None    Comment: number of sex partners in the last 12 months  1  Other Topics Concern   Not on file  Social History Narrative   Lives with wife.   Active on job at [employer] center.  Hobbies - guitar, golf, spending time with grandkids, movies, reading.   Goes to church sometimes, Investment banker, corporate.  04/2022   Social Drivers of Health   Financial Resource Strain: Low Risk  (03/30/2023)   Overall Financial Resource Strain (CARDIA)    Difficulty of Paying Living Expenses: Not hard at all  Food Insecurity: No Food Insecurity (03/30/2023)   Hunger Vital Sign    Worried About Running Out of Food in the Last Year: Never true    Ran Out of Food in the Last Year: Never true  Transportation Needs: No Transportation Needs (03/30/2023)   PRAPARE - Administrator, Civil Service (Medical): No    Lack of Transportation (Non-Medical): No  Physical Activity: Inactive (03/30/2023)   Exercise Vital Sign    Days of Exercise per Week: 0 days    Minutes of Exercise per Session: 0 min  Stress: No Stress Concern Present  (03/30/2023)   [employer] of Occupational Health - Occupational Stress Questionnaire    Feeling of Stress : Only a little  Social Connections: Moderately Isolated (03/30/2023)   Social Connection and Isolation Panel [NHANES]    Frequency of Communication with Friends and Family: Three times a week    Frequency of Social Gatherings with Friends and Family: Three times a week    Attends Religious Services: Never    Active Member of Clubs or Organizations: No    Attends Banker Meetings: Never    Marital Status: Married    Tobacco Counseling Counseling given: Not Answered Tobacco comments: Quit smoking 1976    Clinical Intake:  Pre-visit preparation completed: Yes  Pain : No/denies pain  Nutritional Risks: None Diabetes: No  How often do you need to have someone help you when you read instructions, pamphlets, or other written materials from your doctor or pharmacy?: 1 - Never  Interpreter Needed?: No  Information entered by :: NAllen LPN   Activities of Daily Living     03/30/2023    2:47 PM  In your present state of health, do you have any difficulty performing the following activities:  Hearing? 0  Vision? 0  Difficulty concentrating or making decisions? 0  Walking or climbing stairs? 0  Dressing or bathing? 0  Doing errands, shopping? 0  Preparing Food and eating ? N  Using the Toilet? N  In the past six months, have you accidently leaked urine? N  Do you have problems with loss of bowel control? N  Managing your Medications? N  Managing your Finances? N  Housekeeping or managing your Housekeeping? N    Patient Care Team: Tysinger, Kermit Balo, PA-C as PCP - General (Family Medicine) Artis Delay, MD as Consulting Physician (Hematology and Oncology)  Indicate any recent Medical Services you may have received from other than [facility] providers in the past year (date may be approximate).     Assessment:   This is a routine wellness examination  for [place].  Hearing/Vision screen Hearing Screening - Comments:: Denies hearing issues Vision Screening - Comments:: Regular eye exams, [facility]   Goals Addressed             This Visit's Progress    Patient Stated       03/30/2023, stay alive       Depression Screen     03/30/2023    2:56 PM 03/24/2022    2:42 PM 12/05/2021    3:12 PM 03/14/2021    9:57 AM 02/10/2021    3:11 PM 05/02/2020    8:31 AM 08/02/2018    8:31 AM  PHQ 2/9 Scores  PHQ - 2 Score 1 0 0 0 0 0 0  PHQ- 9 Score 1 0         Fall Risk     03/30/2023    2:55 PM 05/25/2022    2:26 PM 03/24/2022    2:42 PM 12/05/2021    3:12 PM 03/14/2021    9:57 AM  Fall Risk   Falls in the past year? 0 0 0 0 0  Number falls in past yr: 0 0 0 0   Injury with Fall? 0 0 0 0   Risk for fall due to : Medication side effect No Fall Risks Medication side effect No Fall Risks No Fall Risks  Follow up Falls prevention discussed;Falls evaluation completed Falls evaluation completed Falls prevention discussed;Education provided;Falls evaluation completed Falls evaluation completed Education provided;Falls prevention discussed    MEDICARE RISK AT HOME:  Medicare Risk at Home Any stairs in or around the home?: Yes If so, are there any without handrails?: No Home free of loose throw rugs in walkways, pet beds, electrical cords, etc?: Yes Adequate lighting in your home to reduce risk of falls?: Yes Life alert?: No Use of a cane, walker or w/c?: No Grab bars in the bathroom?: Yes Shower chair or bench in shower?: Yes Elevated toilet seat or a handicapped toilet?: No  TIMED UP AND GO:  Was the test performed?  No  Cognitive Function: 6CIT completed        03/30/2023    2:57 PM 03/24/2022    2:44 PM 03/14/2021  9:58 AM 07/06/2018    8:04 AM 03/30/2017    8:57 AM  6CIT Screen  What Year? 0 points 0 points 0 points 0 points 0 points  What month? 0 points 0 points 0 points 0 points 0 points  What time? 0 points 0 points  0 points 0 points 0 points  Count back from 20 0 points 0 points 0 points 0 points 0 points  Months in reverse 0 points 0 points 0 points 0 points 0 points  Repeat phrase 0 points 2 points 0 points 0 points 0 points  Total Score 0 points 2 points 0 points 0 points 0 points    Immunizations Immunization History  Administered Date(s) Administered   Fluad Quad(high Dose 65+) 10/06/2021, 10/23/2022   Influenza Split 11/11/2013, 10/27/2014, 10/31/2015, 10/26/2016, 11/10/2017   Influenza, High Dose Seasonal PF 10/23/2018, 10/26/2020   Influenza-Unspecified 11/11/2013, 10/27/2014, 10/31/2015, 10/26/2016, 11/10/2017   Moderna Covid-19 Vaccine Bivalent Booster 2yrs & up 03/14/2021   Moderna Sars-Covid-2 Vaccination 03/13/2019, 04/11/2019   PNEUMOCOCCAL CONJUGATE-20 05/25/2022   Pneumococcal Conjugate-13 03/30/2017   Unspecified SARS-COV-2 Vaccination 10/23/2022   Zoster Recombinant(Shingrix) 03/14/2021    Screening Tests Health Maintenance  Topic Date Due   DTaP/Tdap/Td (1 - Tdap) Never done   Zoster Vaccines- Shingrix (2 of 2) 05/09/2021   COVID-19 Vaccine (5 - 2024-25 season) 12/18/2022   Medicare Annual Wellness (AWV)  03/29/2024   Pneumonia Vaccine 11+ Years old  Completed   INFLUENZA VACCINE  Completed   Hepatitis C Screening  Completed   HPV VACCINES  Aged Out   Colonoscopy  Discontinued    Health Maintenance  Health Maintenance Due  Topic Date Due   DTaP/Tdap/Td (1 - Tdap) Never done   Zoster Vaccines- Shingrix (2 of 2) 05/09/2021   COVID-19 Vaccine (5 - 2024-25 season) 12/18/2022   Health Maintenance Items Addressed: Due for TDAP and second shingles vaccine  Additional Screening:  Vision Screening: Recommended annual ophthalmology exams for early detection of glaucoma and other disorders of the eye.  Dental Screening: Recommended annual dental exams for proper oral hygiene  Community Resource Referral / Chronic Care Management: CRR required this visit?  No    CCM required this visit?  No     Plan:     I have personally reviewed and noted the following in the patient's chart:   Medical and social history Use of alcohol, tobacco or illicit drugs  Current medications and supplements including opioid prescriptions. Patient is not currently taking opioid prescriptions. Functional ability and status Nutritional status Physical activity Advanced directives List of other physicians Hospitalizations, surgeries, and ER visits in previous 12 months Vitals Screenings to include cognitive, depression, and falls Referrals and appointments  In addition, I have reviewed and discussed with patient certain preventive protocols, quality metrics, and best practice recommendations. A written personalized care plan for preventive services as well as general preventive health recommendations were provided to patient.     Barb Merino, LPN   4/0/9811   After Visit Summary: (MyChart) Due to this being a telephonic visit, the after visit summary with patients personalized plan was offered to patient via MyChart   Notes: Nothing significant to report at this time.

## 2023-03-30 NOTE — Patient Instructions (Signed)
 [name] , Thank you for taking time to come for your Medicare Wellness Visit. I appreciate your ongoing commitment to your health goals. Please review the following plan we discussed and let me know if I can assist you in the future.   Referrals/Orders/Follow-Ups/Clinician Recommendations: none  This is a list of the screening recommended for you and due dates:  Health Maintenance  Topic Date Due   DTaP/Tdap/Td vaccine (1 - Tdap) Never done   Zoster (Shingles) Vaccine (2 of 2) 05/09/2021   COVID-19 Vaccine (5 - 2024-25 season) 12/18/2022   Medicare Annual Wellness Visit  03/29/2024   Pneumonia Vaccine  Completed   Flu Shot  Completed   Hepatitis C Screening  Completed   HPV Vaccine  Aged Out   Colon Cancer Screening  Discontinued    Advanced directives: (Copy Requested) Please bring a copy of your health care power of attorney and living will to the office to be added to your chart at your convenience.  Next Medicare Annual Wellness Visit scheduled for next year: No, work schedule will be changing. Will schedule next year when closer to the time  insert Preventive Care attachment Insert FALL PREVENTION attachment if needed

## 2023-04-02 ENCOUNTER — Telehealth: Payer: Self-pay

## 2023-04-02 ENCOUNTER — Encounter: Payer: Self-pay | Admitting: Internal Medicine

## 2023-04-02 ENCOUNTER — Other Ambulatory Visit (HOSPITAL_COMMUNITY): Payer: Self-pay

## 2023-04-02 ENCOUNTER — Telehealth: Payer: Self-pay | Admitting: Internal Medicine

## 2023-04-02 NOTE — Telephone Encounter (Signed)
    No P/A needed. I have contacted the pref'd pharmacy and the Rx cost is 11.44 for a 90day per the pts pref'd pharmacy

## 2023-04-02 NOTE — Telephone Encounter (Signed)
 Copied from CRM [id number]. Topic: General - Other >> Apr 02, 2023 11:01 AM Antwanette L wrote: Reason for CRM: Patient insurance company([employer] and [employer]) needs a prior authorization for tamsulosin (FLOMAX) 0.4 MG CAPS capsule. Patient can be contacted through MyChart.

## 2023-05-25 ENCOUNTER — Encounter: Payer: Self-pay | Admitting: Medical

## 2023-05-25 ENCOUNTER — Ambulatory Visit: Payer: Medicare HMO | Admitting: Medical

## 2023-05-25 VITALS — BP 150/80 | HR 55 | Ht 66.25 in | Wt 151.4 lb

## 2023-05-25 DIAGNOSIS — Z0000 Encounter for general adult medical examination without abnormal findings: Secondary | ICD-10-CM

## 2023-05-25 DIAGNOSIS — I10 Essential (primary) hypertension: Secondary | ICD-10-CM

## 2023-05-25 DIAGNOSIS — E559 Vitamin D deficiency, unspecified: Secondary | ICD-10-CM

## 2023-05-25 DIAGNOSIS — C9110 Chronic lymphocytic leukemia of B-cell type not having achieved remission: Secondary | ICD-10-CM

## 2023-05-25 DIAGNOSIS — N401 Enlarged prostate with lower urinary tract symptoms: Secondary | ICD-10-CM

## 2023-05-25 DIAGNOSIS — D696 Thrombocytopenia, unspecified: Secondary | ICD-10-CM

## 2023-05-25 DIAGNOSIS — Z7185 Encounter for immunization safety counseling: Secondary | ICD-10-CM

## 2023-05-25 DIAGNOSIS — R350 Frequency of micturition: Secondary | ICD-10-CM | POA: Diagnosis not present

## 2023-05-25 DIAGNOSIS — I700 Atherosclerosis of aorta: Secondary | ICD-10-CM | POA: Diagnosis not present

## 2023-05-25 LAB — POCT URINALYSIS DIP (PROADVANTAGE DEVICE)
Bilirubin, UA: NEGATIVE
Blood, UA: NEGATIVE
Glucose, UA: NEGATIVE mg/dL
Ketones, POC UA: NEGATIVE mg/dL
Leukocytes, UA: NEGATIVE
Nitrite, UA: NEGATIVE
Protein Ur, POC: NEGATIVE mg/dL
Specific Gravity, Urine: 1.01
Urobilinogen, Ur: 0.2
pH, UA: 7 (ref 5.0–8.0)

## 2023-05-25 MED ORDER — ROSUVASTATIN CALCIUM 10 MG PO TABS: 10.0000 mg | ORAL_TABLET | Freq: Every day | ORAL | 2 refills | Status: DC

## 2023-05-25 NOTE — Patient Instructions (Signed)
 This visit was a preventative care visit, also known as wellness visit or routine physical.   Topics typically include healthy lifestyle, diet, exercise, preventative care, vaccinations, sick and well care, proper use of emergency dept and after hours care, as well as other concerns.     Separate significant issues discussed: Hypertension - begin valsartan  40mg  daily.   Discussed risks/benefits of medicaiton and risks of hypertension.   Limit salt, exercise regularly.  Follow up in 1 month. He has valsartan  at home I have prescribed prior.  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  He is agreeable to start low dose statin.  He is reluctant but agreeable to start the medication.  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  No recent problems    General Recommendations: Continue to return yearly for your annual wellness and preventative care visits.  This gives us  a chance to discuss healthy lifestyle, exercise, vaccinations, review your chart record, and perform screenings where appropriate.  I recommend you see your eye doctor yearly for routine vision care.  I recommend you see your dentist yearly for routine dental care including hygiene visits twice yearly.   Vaccination  Immunization History  Administered Date(s) Administered   Fluad Quad(high Dose 65+) 10/06/2021, 10/23/2022   Influenza Split 11/11/2013, 10/27/2014, 10/31/2015, 10/26/2016, 11/10/2017   Influenza, High Dose Seasonal PF 10/23/2018, 10/26/2020   Influenza-Unspecified 11/11/2013, 10/27/2014, 10/31/2015, 10/26/2016, 11/10/2017   Moderna Covid-19 Vaccine Bivalent Booster 78yrs & up 03/14/2021   Moderna Sars-Covid-2 Vaccination 03/13/2019, 04/11/2019   PNEUMOCOCCAL CONJUGATE-20 05/25/2022   Pneumococcal Conjugate-13 03/30/2017    Unspecified SARS-COV-2 Vaccination 10/23/2022   Zoster Recombinant(Shingrix) 03/14/2021    Vaccine recommendations: Consider Shingrix, Tdap, RSV vaccines    Screening for cancer: Colon cancer screening: Prior or last colon cancer screen: 01/2021, due repeat next year 2026.   Prostate Cancer screening: The recommended prostate cancer screening test is a blood test called the prostate-specific antigen (PSA) test. PSA is a protein that is made in the prostate. As you age, your prostate naturally produces more PSA. Abnormally high PSA levels may be caused by: Prostate cancer. An enlarged prostate that is not caused by cancer (benign prostatic hyperplasia, or BPH). This condition is very common in older men. A prostate gland infection (prostatitis) or urinary tract infection. Certain medicines such as male hormones (like testosterone) or other medicines that raise testosterone levels. A rectal exam may be done as part of prostate cancer screening to help provide information about the size of your prostate gland. When a rectal exam is performed, it should be done after the PSA level is drawn to avoid any effect on the results.   Skin cancer screening: Check your skin regularly for new changes, growing lesions, or other lesions of concern Come in for evaluation if you have skin lesions of concern.   Lung cancer screening: If you have a greater than 20 pack year history of tobacco use, then you may qualify for lung cancer screening with a chest CT scan.   Please call your insurance company to inquire about coverage for this test.   Pancreatic cancer:  no current screening test is available or routinely recommended. (risk factors: smoking, overweight or obese, diabetes, chronic pancreatitis, work exposure - dry cleaning, metal working, 78yo>, M>F, African American, family hx/o, hereditary breast, ovarian, melanoma,  lynch, peutz-jeghers).  Symptoms: jaundice, dark urine, light color or greasy  stools, itchy skin, belly or back pain, weight loss, poor appetite, nausea, vomiting, liver enlargement, DVT/blood clots.   We currently don't have screenings for other cancers besides breast, cervical, colon, and lung cancers.  If you have a strong family history of cancer or have other cancer screening concerns, please let me know.  Genetic testing referral is an option for individuals with high cancer risk in the family.  There are some other cancer screenings in development currently.   Bone health: Get at least 150 minutes of aerobic exercise weekly Get weight bearing exercise at least once weekly Bone density test:  A bone density test is an imaging test that uses a type of X-ray to measure the amount of calcium and other minerals in your bones. The test may be used to diagnose or screen you for a condition that causes weak or thin bones (osteoporosis), predict your risk for a broken bone (fracture), or determine how well your osteoporosis treatment is working. The bone density test is recommended for females 65 and older, or females or males <65 if certain risk factors such as thyroid  disease, long term use of steroids such as for asthma or rheumatological issues, vitamin D  deficiency, estrogen deficiency, family history of osteoporosis, self or family history of fragility fracture in first degree relative.    Heart health: Get at least 150 minutes of aerobic exercise weekly Limit alcohol It is important to maintain a healthy blood pressure and healthy cholesterol numbers  Heart disease screening: Screening for heart disease includes screening for blood pressure, fasting lipids, glucose/diabetes screening, BMI height to weight ratio, reviewed of smoking status, physical activity, and diet.    Goals include blood pressure 120/80 or less, maintaining a healthy lipid/cholesterol profile, preventing diabetes or keeping diabetes numbers under good control, not smoking or using tobacco  products, exercising most days per week or at least 150 minutes per week of exercise, and eating healthy variety of fruits and vegetables, healthy oils, and avoiding unhealthy food choices like fried food, fast food, high sugar and high cholesterol foods.    Other tests may possibly include EKG test, CT coronary calcium score, echocardiogram, exercise treadmill stress test.     Heart Disease Testing completed: Reviewed  CT chest 10/2019 showing some thoracic aortic atheroscleroses.   Vascular disease screening: For higher risk individuals including smokers, diabetics, patients with known heart disease or high blood pressure, kidney disease, and others, screening for vascular disease or atherosclerosis of the arteries is available.  Examples may include carotid ultrasound, abdominal aortic ultrasound, ABI blood flow screening in the legs, thoracic aorta screening.    Medical care options: I recommend you continue to seek care here first for routine care.  We try really hard to have available appointments Monday through Friday daytime hours for sick visits, acute visits, and physicals.  Urgent care should be used for after hours and weekends for significant issues that cannot wait till the next day.  The emergency department should be used for significant potentially life-threatening emergencies.  The emergency department is expensive, can often have long wait times for less significant concerns, so try to utilize primary care, urgent care, or telemedicine when possible to avoid unnecessary trips to the emergency department.  Virtual visits and telemedicine have been introduced since the pandemic started in 2020, and can be convenient ways to receive medical care.  We offer virtual appointments as well to assist you in  a variety of options to seek medical care.   Legal  Take the time to do a last will and testament, Advanced Directives including Health Care Power of Attorney and Living Will  documents.  Don't leave your family with burdens that can be handled ahead of time.   Advanced Directives: I recommend you consider completing a Health Care Power of Attorney and Living Will.   These documents respect your wishes and help alleviate burdens on your loved ones if you were to become terminally ill or be in a position to need those documents enforced.    You can complete Advanced Directives yourself, have them notarized, then have copies made for our office, for you and for anybody you feel should have them in safe keeping.  Or, you can have an attorney prepare these documents.   If you haven't updated your Last Will and Testament in a while, it may be worthwhile having an attorney prepare these documents together and save on some costs.       Spiritual and Emotional Health Keeping a healthy spiritual life can help you better manage your physical health. Your spiritual life can help you to cope with any issues that may arise with your physical health.  Balance can keep us  healthy and help us  to recover.  If you are struggling with your spiritual health there are questions that you may want to ask yourself:  What makes me feel most complete? When do I feel most connected to the rest of the world? Where do I find the most inner strength? What am I doing when I feel whole?  Helpful tips: Being in nature. Some people feel very connected and at peace when they are walking outdoors or are outside. Helping others. Some feel the largest sense of wellbeing when they are of service to others. Being of service can take on many forms. It can be doing volunteer work, being kind to strangers, or offering a hand to a friend in need. Gratitude. Some people find they feel the most connected when they remain grateful. They may make lists of all the things they are grateful for or say a thank you out loud for all they have.    Emotional Health Are you in tune with your emotional health?  Check  out this link: http://www.marquez-love.com/    Financial Health Make sure you use a budget for your personal finances Make sure you are insured against risks (health insurance, life insurance, auto insurance, etc) Save more, spend less Set financial goals If you need help in this area, good resources include counseling through [employer] or other community resources, have a meeting with a Social research officer, government, and a good resource is the [employer]

## 2023-05-25 NOTE — Progress Notes (Signed)
 Subjective:   HPI  Tyler Wu is a 78 y.o. male who presents for Chief Complaint  Patient presents with   Annual Exam    Fasting annual exam, will give urine on his way out. Reminded to get Tdap and Shingrix at pharmacy.     Patient Care Team: Aivy Akter, Christiane Cowing, PA-C as PCP - General (Family Medicine) Almeda Jacobs, MD as Consulting Physician (Hematology and Oncology) Dr. Genell Ken, GI Dermatology   Concerns: Here for well visit  Doing ok in general  Sees oncology regularly, blood counts stable  He is seeing dermatology soon.   Reviewed their medical, surgical, family, social, medication, and allergy history and updated chart as appropriate.  Allergies  Allergen Reactions   Levofloxacin Other (See Comments)    Bad dreams     Past Medical History:  Diagnosis Date   Allergy    Aortic atherosclerosis (HCC) 10/2019   per CT chest   BPH (benign prostatic hyperplasia)    CLL (chronic lymphocytic leukemia) (HCC) 2005   followed by oncology yearly.   History of COVID-19 2021   Hypertension 2024    Current Outpatient Medications on File Prior to Visit  Medication Sig Dispense Refill   aspirin 81 MG chewable tablet Chew 81 mg by mouth.     cetirizine  (ZYRTEC  ALLERGY) 10 MG tablet Take 1 tablet (10 mg total) by mouth at bedtime. 90 tablet 3   Multiple Vitamins-Minerals (CENTRUM SILVER 50+MEN PO) Take 1 tablet by mouth daily.     tamsulosin  (FLOMAX ) 0.4 MG CAPS capsule TAKE 1 CAPSULE(0.4 MG) BY MOUTH DAILY AFTER SUPPER 90 capsule 0   Vitamin D , Ergocalciferol , (DRISDOL ) 1.25 MG (50000 UNIT) CAPS capsule TAKE 1 CAPSULE BY MOUTH EVERY 7 DAYS 12 capsule 1   fluticasone  (FLONASE ) 50 MCG/ACT nasal spray PLACE 2 SPRAYS INTO BOTH NOSTRILS DAILY FOR 3 TO 5 DAYS, THEN DECREASE TO 1 SPRAY DAILY (Patient not taking: Reported on 05/25/2023) 16 g 0   No current facility-administered medications on file prior to visit.      Current Outpatient Medications:    aspirin 81 MG  chewable tablet, Chew 81 mg by mouth., Disp: , Rfl:    cetirizine  (ZYRTEC  ALLERGY) 10 MG tablet, Take 1 tablet (10 mg total) by mouth at bedtime., Disp: 90 tablet, Rfl: 3   Multiple Vitamins-Minerals (CENTRUM SILVER 50+MEN PO), Take 1 tablet by mouth daily., Disp: , Rfl:    rosuvastatin (CRESTOR) 10 MG tablet, Take 1 tablet (10 mg total) by mouth daily., Disp: 90 tablet, Rfl: 2   tamsulosin  (FLOMAX ) 0.4 MG CAPS capsule, TAKE 1 CAPSULE(0.4 MG) BY MOUTH DAILY AFTER SUPPER, Disp: 90 capsule, Rfl: 0   Vitamin D , Ergocalciferol , (DRISDOL ) 1.25 MG (50000 UNIT) CAPS capsule, TAKE 1 CAPSULE BY MOUTH EVERY 7 DAYS, Disp: 12 capsule, Rfl: 1   fluticasone  (FLONASE ) 50 MCG/ACT nasal spray, PLACE 2 SPRAYS INTO BOTH NOSTRILS DAILY FOR 3 TO 5 DAYS, THEN DECREASE TO 1 SPRAY DAILY (Patient not taking: Reported on 05/25/2023), Disp: 16 g, Rfl: 0  Family History  Problem Relation Age of Onset   Cancer Father 76       melonoma   Heart attack Maternal Grandmother    Hypertension Maternal Grandmother    Heart attack Maternal Grandfather    Hypertension Maternal Grandfather    Kidney disease Brother     Past Surgical History:  Procedure Laterality Date   COLONOSCOPY     around 2012   COLONOSCOPY  01/2021   multiple  polyps, repeat in 2026; Dr. Genell Ken   HERNIA REPAIR  1996    Review of Systems  Constitutional:  Negative for chills, fever, malaise/fatigue and weight loss.  HENT:  Negative for congestion, ear pain, hearing loss, sore throat and tinnitus.   Eyes:  Negative for blurred vision, pain and redness.  Respiratory:  Negative for cough, hemoptysis and shortness of breath.   Cardiovascular:  Negative for chest pain, palpitations, orthopnea, claudication and leg swelling.  Gastrointestinal:  Negative for abdominal pain, blood in stool, constipation, diarrhea, nausea and vomiting.  Genitourinary:  Negative for dysuria, flank pain, frequency, hematuria and urgency.  Musculoskeletal:  Negative for  falls, joint pain and myalgias.  Skin:  Negative for itching and rash.       moles  Neurological:  Negative for dizziness, tingling, speech change, weakness and headaches.  Endo/Heme/Allergies:  Negative for polydipsia. Does not bruise/bleed easily.  Psychiatric/Behavioral:  Negative for depression and memory loss. The patient is not nervous/anxious and does not have insomnia.         Objective:  BP (!) 150/80   Pulse (!) 55   Ht 5' 6.25" (1.683 m)   Wt 151 lb 6.4 oz (68.7 kg)   SpO2 97%   BMI 24.25 kg/m   General appearance: alert, no distress, WD/WN, Caucasian male Skin: numerous macules, but no specific worrisome lesions HEENT: normocephalic, conjunctiva/corneas normal, sclerae anicteric, PERRLA, EOMi, nares patent, no discharge or erythema, pharynx normal Oral cavity: MMM, tongue normal, teeth upper partial plate, teeth otherwise in good repair Neck: supple, no lymphadenopathy, no thyromegaly, no masses, normal ROM, no bruits Chest: non tender, normal shape and expansion Heart: RRR, normal S1, S2, no murmurs Lungs: CTA bilaterally, no wheezes, rhonchi, or rales Abdomen: +bs, soft, non tender, non distended, no masses, no hepatomegaly, no splenomegaly, no bruits Back: non tender, normal ROM, no scoliosis Musculoskeletal: upper extremities non tender, no obvious deformity, normal ROM throughout, lower extremities non tender, no obvious deformity, normal ROM throughout Extremities: no edema, no cyanosis, no clubbing Pulses: 2+ symmetric, upper and lower extremities, normal cap refill Neurological: alert, oriented x 3, CN2-12 intact, strength normal upper extremities and lower extremities, sensation normal throughout, DTRs 2+ throughout, no cerebellar signs, gait normal Psychiatric: normal affect, behavior normal, pleasant  GU/rectal - declined     Assessment and Plan :   Encounter Diagnoses  Name Primary?   Encounter for health maintenance examination in adult Yes    Aortic atherosclerosis (HCC)    Benign prostatic hyperplasia with urinary frequency    CLL (chronic lymphocytic leukemia) (HCC)    Vitamin D  deficiency    Vaccine counseling    Thrombocytopenia (HCC)    Essential hypertension, benign     This visit was a preventative care visit, also known as wellness visit or routine physical.   Topics typically include healthy lifestyle, diet, exercise, preventative care, vaccinations, sick and well care, proper use of emergency dept and after hours care, as well as other concerns.     Separate significant issues discussed: Hypertension - begin valsartan  40mg  daily.   Discussed risks/benefits of medicaiton and risks of hypertension.   Limit salt, exercise regularly.  Follow up in 1 month. He has valsartan  at home I have prescribed prior.  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  He is agreeable to start low dose statin.  He is reluctant but agreeable to start the medication.  CLL - reviewed oncology notes and labs from 11/2022, stable  Vit d deficiency -  on therapy, updated labs today  BPH - continue Flomax  0.4mg  daily  Prior dizziness, vertigo - has had this prior, used meclizine .  No recent problems    General Recommendations: Continue to return yearly for your annual wellness and preventative care visits.  This gives us  a chance to discuss healthy lifestyle, exercise, vaccinations, review your chart record, and perform screenings where appropriate.  I recommend you see your eye doctor yearly for routine vision care.  I recommend you see your dentist yearly for routine dental care including hygiene visits twice yearly.   Vaccination  Immunization History  Administered Date(s) Administered   Fluad Quad(high Dose 65+) 10/06/2021, 10/23/2022   Influenza Split 11/11/2013, 10/27/2014, 10/31/2015, 10/26/2016, 11/10/2017   Influenza, High Dose Seasonal PF 10/23/2018, 10/26/2020   Influenza-Unspecified 11/11/2013, 10/27/2014,  10/31/2015, 10/26/2016, 11/10/2017   Moderna Covid-19 Vaccine Bivalent Booster 8yrs & up 03/14/2021   Moderna Sars-Covid-2 Vaccination 03/13/2019, 04/11/2019   PNEUMOCOCCAL CONJUGATE-20 05/25/2022   Pneumococcal Conjugate-13 03/30/2017   Unspecified SARS-COV-2 Vaccination 10/23/2022   Zoster Recombinant(Shingrix) 03/14/2021    Vaccine recommendations: Consider Shingrix, Tdap, RSV vaccines    Screening for cancer: Colon cancer screening: Prior or last colon cancer screen: 01/2021, due repeat next year 2026.   Prostate Cancer screening: The recommended prostate cancer screening test is a blood test called the prostate-specific antigen (PSA) test. PSA is a protein that is made in the prostate. As you age, your prostate naturally produces more PSA. Abnormally high PSA levels may be caused by: Prostate cancer. An enlarged prostate that is not caused by cancer (benign prostatic hyperplasia, or BPH). This condition is very common in older men. A prostate gland infection (prostatitis) or urinary tract infection. Certain medicines such as male hormones (like testosterone) or other medicines that raise testosterone levels. A rectal exam may be done as part of prostate cancer screening to help provide information about the size of your prostate gland. When a rectal exam is performed, it should be done after the PSA level is drawn to avoid any effect on the results.   Skin cancer screening: Check your skin regularly for new changes, growing lesions, or other lesions of concern Come in for evaluation if you have skin lesions of concern.   Lung cancer screening: If you have a greater than 20 pack year history of tobacco use, then you may qualify for lung cancer screening with a chest CT scan.   Please call your insurance company to inquire about coverage for this test.   Pancreatic cancer:  no current screening test is available or routinely recommended. (risk factors: smoking, overweight or  obese, diabetes, chronic pancreatitis, work exposure - dry cleaning, metal working, 78yo>, M>F, Tree surgeon, family hx/o, hereditary breast, ovarian, melanoma, lynch, peutz-jeghers).  Symptoms: jaundice, dark urine, light color or greasy stools, itchy skin, belly or back pain, weight loss, poor appetite, nausea, vomiting, liver enlargement, DVT/blood clots.   We currently don't have screenings for other cancers besides breast, cervical, colon, and lung cancers.  If you have a strong family history of cancer or have other cancer screening concerns, please let me know.  Genetic testing referral is an option for individuals with high cancer risk in the family.  There are some other cancer screenings in development currently.   Bone health: Get at least 150 minutes of aerobic exercise weekly Get weight bearing exercise at least once weekly Bone density test:  A bone density test is an imaging test that uses a type of X-ray to measure  the amount of calcium and other minerals in your bones. The test may be used to diagnose or screen you for a condition that causes weak or thin bones (osteoporosis), predict your risk for a broken bone (fracture), or determine how well your osteoporosis treatment is working. The bone density test is recommended for females 65 and older, or females or males <65 if certain risk factors such as thyroid  disease, long term use of steroids such as for asthma or rheumatological issues, vitamin D  deficiency, estrogen deficiency, family history of osteoporosis, self or family history of fragility fracture in first degree relative.    Heart health: Get at least 150 minutes of aerobic exercise weekly Limit alcohol It is important to maintain a healthy blood pressure and healthy cholesterol numbers  Heart disease screening: Screening for heart disease includes screening for blood pressure, fasting lipids, glucose/diabetes screening, BMI height to weight ratio, reviewed of  smoking status, physical activity, and diet.    Goals include blood pressure 120/80 or less, maintaining a healthy lipid/cholesterol profile, preventing diabetes or keeping diabetes numbers under good control, not smoking or using tobacco products, exercising most days per week or at least 150 minutes per week of exercise, and eating healthy variety of fruits and vegetables, healthy oils, and avoiding unhealthy food choices like fried food, fast food, high sugar and high cholesterol foods.    Other tests may possibly include EKG test, CT coronary calcium score, echocardiogram, exercise treadmill stress test.     Heart Disease Testing completed: Reviewed  CT chest 10/2019 showing some thoracic aortic atheroscleroses.   Vascular disease screening: For higher risk individuals including smokers, diabetics, patients with known heart disease or high blood pressure, kidney disease, and others, screening for vascular disease or atherosclerosis of the arteries is available.  Examples may include carotid ultrasound, abdominal aortic ultrasound, ABI blood flow screening in the legs, thoracic aorta screening.    Medical care options: I recommend you continue to seek care here first for routine care.  We try really hard to have available appointments Monday through Friday daytime hours for sick visits, acute visits, and physicals.  Urgent care should be used for after hours and weekends for significant issues that cannot wait till the next day.  The emergency department should be used for significant potentially life-threatening emergencies.  The emergency department is expensive, can often have long wait times for less significant concerns, so try to utilize primary care, urgent care, or telemedicine when possible to avoid unnecessary trips to the emergency department.  Virtual visits and telemedicine have been introduced since the pandemic started in 2020, and can be convenient ways to receive medical care.  We  offer virtual appointments as well to assist you in a variety of options to seek medical care.   Legal  Take the time to do a last will and testament, Advanced Directives including Health Care Power of Attorney and Living Will documents.  Don't leave your family with burdens that can be handled ahead of time.   Advanced Directives: I recommend you consider completing a Health Care Power of Attorney and Living Will.   These documents respect your wishes and help alleviate burdens on your loved ones if you were to become terminally ill or be in a position to need those documents enforced.    You can complete Advanced Directives yourself, have them notarized, then have copies made for our office, for you and for anybody you feel should have them in safe keeping.  Or,  you can have an attorney prepare these documents.   If you haven't updated your Last Will and Testament in a while, it may be worthwhile having an attorney prepare these documents together and save on some costs.       Spiritual and Emotional Health Keeping a healthy spiritual life can help you better manage your physical health. Your spiritual life can help you to cope with any issues that may arise with your physical health.  Balance can keep us  healthy and help us  to recover.  If you are struggling with your spiritual health there are questions that you may want to ask yourself:  What makes me feel most complete? When do I feel most connected to the rest of the world? Where do I find the most inner strength? What am I doing when I feel whole?  Helpful tips: Being in nature. Some people feel very connected and at peace when they are walking outdoors or are outside. Helping others. Some feel the largest sense of wellbeing when they are of service to others. Being of service can take on many forms. It can be doing volunteer work, being kind to strangers, or offering a hand to a friend in need. Gratitude. Some people find they  feel the most connected when they remain grateful. They may make lists of all the things they are grateful for or say a thank you out loud for all they have.    Emotional Health Are you in tune with your emotional health?  Check out this link: http://[email or web address]-love.[email or web address]/    Financial Health Make sure you use a budget for your personal finances Make sure you are insured against risks (health insurance, life insurance, auto insurance, etc) Save more, spend less Set financial goals If you need help in this area, good resources include counseling through [employer] or other community resources, have a meeting with a Social research officer, government, and a good resource is the [employer]    [place] was seen today for annual exam.  Diagnoses and all orders for this visit:  Encounter for health maintenance examination in adult -     Comprehensive metabolic panel with GFR -     Lipid panel -     PSA -     Cancel: Urinalysis, Routine w reflex microscopic -     VITAMIN D  25 Hydroxy (Vit-D Deficiency, Fractures) -     POCT Urinalysis DIP (Proadvantage Device)  Aortic atherosclerosis (HCC) -     Lipid panel  Benign prostatic hyperplasia with urinary frequency -     PSA  CLL (chronic lymphocytic leukemia) (HCC)  Vitamin D  deficiency -     VITAMIN D  25 Hydroxy (Vit-D Deficiency, Fractures)  Vaccine counseling  Thrombocytopenia (HCC)  Essential hypertension, benign -     POCT Urinalysis DIP (Proadvantage Device)  Other orders -     rosuvastatin (CRESTOR) 10 MG tablet; Take 1 tablet (10 mg total) by mouth daily.     Follow-up pending labs, yearly for physical

## 2023-05-26 ENCOUNTER — Other Ambulatory Visit: Payer: Self-pay | Admitting: Medical

## 2023-05-26 LAB — LIPID PANEL
Chol/HDL Ratio: 3.5 ratio (ref 0.0–5.0)
Cholesterol, Total: 201 mg/dL — ABNORMAL HIGH (ref 100–199)
HDL: 58 mg/dL (ref >39)
LDL Chol Calc (NIH): 119 mg/dL — ABNORMAL HIGH (ref 0–99)
Triglycerides: 134 mg/dL (ref 0–149)
VLDL Cholesterol Cal: 24 mg/dL (ref 5–40)

## 2023-05-26 LAB — COMPREHENSIVE METABOLIC PANEL WITH GFR
ALT: 14 IU/L (ref 0–44)
AST: 20 IU/L (ref 0–40)
Albumin: 4.6 g/dL (ref 3.8–4.8)
Alkaline Phosphatase: 78 IU/L (ref 44–121)
BUN/Creatinine Ratio: 13 (ref 10–24)
BUN: 16 mg/dL (ref 8–27)
Bilirubin Total: 0.5 mg/dL (ref 0.0–1.2)
CO2: 23 mmol/L (ref 20–29)
Calcium: 9 mg/dL (ref 8.6–10.2)
Chloride: 104 mmol/L (ref 96–106)
Creatinine, Ser: 1.22 mg/dL (ref 0.76–1.27)
Globulin, Total: 1.4 g/dL — ABNORMAL LOW (ref 1.5–4.5)
Glucose: 91 mg/dL (ref 70–99)
Potassium: 4.2 mmol/L (ref 3.5–5.2)
Sodium: 143 mmol/L (ref 134–144)
Total Protein: 6 g/dL (ref 6.0–8.5)
eGFR: 61 mL/min/{1.73_m2} (ref >59)

## 2023-05-26 LAB — VITAMIN D 25 HYDROXY (VIT D DEFICIENCY, FRACTURES): Vit D, 25-Hydroxy: 67.8 ng/mL (ref 30.0–100.0)

## 2023-05-26 LAB — PSA: Prostate Specific Ag, Serum: 2.4 ng/mL (ref 0.0–4.0)

## 2023-05-26 MED ORDER — VITAMIN D (ERGOCALCIFEROL) 1.25 MG (50000 UNIT) PO CAPS: 50000.0000 [IU] | ORAL_CAPSULE | ORAL | 3 refills | Status: AC

## 2023-05-26 MED ORDER — TAMSULOSIN HCL 0.4 MG PO CAPS: ORAL_CAPSULE | ORAL | 3 refills | Status: DC

## 2023-05-26 NOTE — Progress Notes (Signed)
 Results sent through MyChart

## 2023-06-10 DIAGNOSIS — L821 Other seborrheic keratosis: Secondary | ICD-10-CM | POA: Diagnosis not present

## 2023-06-10 DIAGNOSIS — B078 Other viral warts: Secondary | ICD-10-CM | POA: Diagnosis not present

## 2023-06-10 DIAGNOSIS — L601 Onycholysis: Secondary | ICD-10-CM | POA: Diagnosis not present

## 2023-06-10 DIAGNOSIS — D225 Melanocytic nevi of trunk: Secondary | ICD-10-CM | POA: Diagnosis not present

## 2023-06-10 DIAGNOSIS — L538 Other specified erythematous conditions: Secondary | ICD-10-CM | POA: Diagnosis not present

## 2023-06-10 DIAGNOSIS — L814 Other melanin hyperpigmentation: Secondary | ICD-10-CM | POA: Diagnosis not present

## 2023-06-10 DIAGNOSIS — Z789 Other specified health status: Secondary | ICD-10-CM | POA: Diagnosis not present

## 2023-06-10 DIAGNOSIS — D1721 Benign lipomatous neoplasm of skin and subcutaneous tissue of right arm: Secondary | ICD-10-CM | POA: Diagnosis not present

## 2023-06-10 DIAGNOSIS — B351 Tinea unguium: Secondary | ICD-10-CM | POA: Diagnosis not present

## 2023-06-28 ENCOUNTER — Other Ambulatory Visit: Payer: Self-pay | Admitting: Medical

## 2023-07-06 ENCOUNTER — Other Ambulatory Visit: Payer: Self-pay | Admitting: Medical

## 2023-07-08 DIAGNOSIS — B078 Other viral warts: Secondary | ICD-10-CM | POA: Diagnosis not present

## 2023-07-08 DIAGNOSIS — R208 Other disturbances of skin sensation: Secondary | ICD-10-CM | POA: Diagnosis not present

## 2023-07-08 DIAGNOSIS — L03012 Cellulitis of left finger: Secondary | ICD-10-CM | POA: Diagnosis not present

## 2023-07-08 DIAGNOSIS — L2989 Other pruritus: Secondary | ICD-10-CM | POA: Diagnosis not present

## 2023-07-08 DIAGNOSIS — L820 Inflamed seborrheic keratosis: Secondary | ICD-10-CM | POA: Diagnosis not present

## 2023-07-08 DIAGNOSIS — L538 Other specified erythematous conditions: Secondary | ICD-10-CM | POA: Diagnosis not present

## 2023-07-08 DIAGNOSIS — Z789 Other specified health status: Secondary | ICD-10-CM | POA: Diagnosis not present

## 2023-07-08 DIAGNOSIS — L0889 Other specified local infections of the skin and subcutaneous tissue: Secondary | ICD-10-CM | POA: Diagnosis not present

## 2023-07-19 ENCOUNTER — Ambulatory Visit: Admitting: Medical

## 2023-07-19 NOTE — Progress Notes (Deleted)
 Subjective:  Tyler Wu is a 78 y.o. male who presents for No chief complaint on file.    Last visit April 2025 we began valsartan  40 mg daily for hypertension  Last visit he was agreeable to start statin given prior findings of aortic atherosclerosis on scan.  We started low-dose statin Crestor  10 mg daily.   Last visit due to BPH concerns and urinary concerns we started Flomax  0.4 mg daily   ***  No other aggravating or relieving factors.    No other c/o.  The following portions of the patient's history were reviewed and updated as appropriate: allergies, current medications, past family history, past medical history, past social history, past surgical history and problem list.  ROS Otherwise as in subjective above  Objective: There were no vitals taken for this visit.  General appearance: alert, no distress, well developed, well nourished HEENT: normocephalic, sclerae anicteric, conjunctiva pink and moist, TMs pearly, nares patent, no discharge or erythema, pharynx normal Oral cavity: MMM, no lesions Neck: supple, no lymphadenopathy, no thyromegaly, no masses Heart: RRR, normal S1, S2, no murmurs Lungs: CTA bilaterally, no wheezes, rhonchi, or rales Abdomen: +bs, soft, non tender, non distended, no masses, no hepatomegaly, no splenomegaly Pulses: 2+ radial pulses, 2+ pedal pulses, normal cap refill Ext: no edema   Assessment: No diagnosis found.   Plan: ***  There are no diagnoses linked to this encounter.  Follow up: ***

## 2023-07-22 ENCOUNTER — Encounter: Payer: Self-pay | Admitting: Medical

## 2023-07-22 ENCOUNTER — Ambulatory Visit: Admitting: Medical

## 2023-07-22 VITALS — BP 126/66 | HR 64 | Ht 67.25 in | Wt 150.6 lb

## 2023-07-22 DIAGNOSIS — Z79899 Other long term (current) drug therapy: Secondary | ICD-10-CM | POA: Diagnosis not present

## 2023-07-22 DIAGNOSIS — E785 Hyperlipidemia, unspecified: Secondary | ICD-10-CM

## 2023-07-22 DIAGNOSIS — N401 Enlarged prostate with lower urinary tract symptoms: Secondary | ICD-10-CM

## 2023-07-22 DIAGNOSIS — R350 Frequency of micturition: Secondary | ICD-10-CM

## 2023-07-22 DIAGNOSIS — I700 Atherosclerosis of aorta: Secondary | ICD-10-CM

## 2023-07-22 DIAGNOSIS — I10 Essential (primary) hypertension: Secondary | ICD-10-CM | POA: Diagnosis not present

## 2023-07-22 DIAGNOSIS — I8393 Asymptomatic varicose veins of bilateral lower extremities: Secondary | ICD-10-CM | POA: Diagnosis not present

## 2023-07-22 NOTE — Progress Notes (Signed)
 Subjective:  Tyler Wu is a 78 y.o. male who presents for Chief Complaint  Patient presents with   other     Checking B/P and to see if BP meds are working- feeling better, some instances felt weird and low when checked 107/60 lowest so far-     Here for medication management.  Last visit 04/2023, we initiated Valsartan  40mg  for hypertension.  Overall he is doing okay on this.  He had a couple episodes where he felt a little lightheaded and 1 particular day he checked his blood pressure and it was 107/60.  He has not had a lot of this experience.  No chest pain, no palpitations, no edema  Hyperlipidemia-he did start Crestor  10 mg and aspirin 81 mg after last visit.  He is doing this every other day.  So far not having any trouble with that.  Last visit we discussed BPH nocturia symptoms.  He is taking Flomax  and sees some improvement.  No other aggravating or relieving factors.    No other c/o.  Past Medical History:  Diagnosis Date   Allergy    Aortic atherosclerosis (HCC) 10/2019   per CT chest   BPH (benign prostatic hyperplasia)    CLL (chronic lymphocytic leukemia) (HCC) 2005   followed by oncology yearly.   History of COVID-19 2021   Hypertension 2024   Current Outpatient Medications on File Prior to Visit  Medication Sig Dispense Refill   aspirin 81 MG chewable tablet Chew 81 mg by mouth.     cetirizine  (ZYRTEC ) 10 MG tablet TAKE 1 TABLET(10 MG) BY MOUTH AT BEDTIME 90 tablet 3   Multiple Vitamins-Minerals (CENTRUM SILVER 50+MEN PO) Take 1 tablet by mouth daily.     rosuvastatin  (CRESTOR ) 10 MG tablet Take 1 tablet (10 mg total) by mouth daily. 90 tablet 2   tamsulosin  (FLOMAX ) 0.4 MG CAPS capsule TAKE 1 CAPSULE(0.4 MG) BY MOUTH DAILY AFTER SUPPER 90 capsule 1   Vitamin D , Ergocalciferol , (DRISDOL ) 1.25 MG (50000 UNIT) CAPS capsule Take 1 capsule (50,000 Units total) by mouth every 7 (seven) days. 12 capsule 3   fluticasone  (FLONASE ) 50 MCG/ACT nasal spray PLACE 2  SPRAYS INTO BOTH NOSTRILS DAILY FOR 3 TO 5 DAYS, THEN DECREASE TO 1 SPRAY DAILY (Patient not taking: Reported on 07/22/2023) 16 g 0   No current facility-administered medications on file prior to visit.     The following portions of the patient's history were reviewed and updated as appropriate: allergies, current medications, past family history, past medical history, past social history, past surgical history and problem list.  ROS Otherwise as in subjective above     Objective: BP 126/66   Pulse 64   Ht 5' 7.25 (1.708 m)   Wt 150 lb 9.6 oz (68.3 kg)   SpO2 98%   BMI 23.41 kg/m   General appearance: alert, no distress, well developed, well nourished Neck: supple, no lymphadenopathy, no thyromegaly, no masses Heart: RRR, normal S1, S2, no murmurs Lungs: CTA bilaterally, no wheezes, rhonchi, or rales Pulses: 2+ radial pulses, 2+ pedal pulses, normal cap refill Ext: no edema, mild varicose veins of both lower legs, some spider veins as well noted   Assessment: Encounter Diagnoses  Name Primary?   Essential hypertension, benign Yes   Aortic atherosclerosis (HCC)    Benign prostatic hyperplasia with urinary frequency    Hyperlipidemia, unspecified hyperlipidemia type    Medication management    Varicose veins of both lower extremities, unspecified whether complicated  Plan: Patient seen today for medication management   Hypertension -for now continue valsartan  40 started last visit.  Blood pressure today looks good.  However if any low readings or lightheadedness we may need to change to something else.  We discussed alpha-blocker which can help prostate BPH symptoms as well.  Aortic atherosclerosis, hyperlipidemia -since he is tolerating Crestor  every other day he will increase to daily Crestor  10 mg and continue aspirin 81 mg daily.  Return in the next month for fasting lab.  He is not fasting today  BPH -continue Flomax .  Consider trial of alpha-blocker if not  seeing improvements of nocturia  Varicose veins-mild, advised compression hose daily, continue being active and regular exercise  Tyler Wu was seen today for other .  Diagnoses and all orders for this visit:  Essential hypertension, benign -     Comprehensive metabolic panel with GFR; Future  Aortic atherosclerosis (HCC)  Benign prostatic hyperplasia with urinary frequency  Hyperlipidemia, unspecified hyperlipidemia type -     Lipid panel; Future -     Comprehensive metabolic panel with GFR; Future  Medication management  Varicose veins of both lower extremities, unspecified whether complicated    Follow up: 3-4 weeks for lab

## 2023-07-26 ENCOUNTER — Ambulatory Visit: Payer: Self-pay

## 2023-07-26 ENCOUNTER — Telehealth: Payer: Self-pay

## 2023-07-26 ENCOUNTER — Ambulatory Visit: Admission: EM | Admit: 2023-07-26 | Discharge: 2023-07-26 | Disposition: A | Discharge status: Home / Self Care

## 2023-07-26 DIAGNOSIS — R509 Fever, unspecified: Secondary | ICD-10-CM

## 2023-07-26 DIAGNOSIS — R197 Diarrhea, unspecified: Secondary | ICD-10-CM

## 2023-07-26 HISTORY — DX: Unspecified cataract: H26.9

## 2023-07-26 LAB — POCT INFLUENZA A/B
Influenza A, POC: NEGATIVE
Influenza B, POC: NEGATIVE

## 2023-07-26 LAB — POC SARS CORONAVIRUS 2 AG -  ED: SARS Coronavirus 2 Ag: NEGATIVE

## 2023-07-26 NOTE — Telephone Encounter (Signed)
 FYI Only or Action Required?: FYI only for provider.  Patient was last seen in primary care on 07/22/2023 by Bulah Alm RAMAN, PA-C. Called Nurse Triage reporting Diarrhea. Symptoms began today. Interventions attempted: Rest, hydration, or home remedies. Symptoms are: stable.  Triage Disposition: See HCP Within 4 Hours (Or PCP Triage) Unable to schedule d/t lack of PCP availability. Referred to local UC.   Patient/caregiver understands and will follow disposition?: Yes      Reason for Disposition  [1] SEVERE diarrhea (e.g., 7 or more times / day more than normal) AND [2] age > 60 years  Answer Assessment - Initial Assessment Questions 1. DIARRHEA SEVERITY: How bad is the diarrhea? How many more stools have you had in the past 24 hours than normal?    - NO DIARRHEA (SCALE 0)   - MILD (SCALE 1-3): Few loose or mushy BMs; increase of 1-3 stools over normal daily number of stools; mild increase in ostomy output.   -  MODERATE (SCALE 4-7): Increase of 4-6 stools daily over normal; moderate increase in ostomy output.   -  SEVERE (SCALE 8-10; OR WORST POSSIBLE): Increase of 7 or more stools daily over normal; moderate increase in ostomy output; incontinence.       ---------------------------- 10- Severe    2. ONSET: When did the diarrhea begin?      ---- This morning, 3am    3. BM CONSISTENCY: How loose or watery is the diarrhea?     ------ Watery   4. VOMITING: Are you also vomiting? If Yes, ask: How many times in the past 24 hours?      --------------------------- Denies     5. ABDOMEN PAIN: Are you having any abdomen pain? If Yes, ask: What does it feel like? (e.g., crampy, dull, intermittent, constant)      ------------------- Denies      6. ABDOMEN PAIN SEVERITY: If present, ask: How bad is the pain?  (e.g., Scale 1-10; mild, moderate, or severe)   - MILD (1-3): doesn't interfere with normal activities, abdomen soft and not tender to touch    - MODERATE  (4-7): interferes with normal activities or awakens from sleep, abdomen tender to touch    - SEVERE (8-10): excruciating pain, doubled over, unable to do any normal activities       -----------------------Denies    7. ORAL INTAKE: If vomiting, Have you been able to drink liquids? How much liquids have you had in the past 24 hours?     ---------------- Drinking  Adequately   8. HYDRATION: Any signs of dehydration? (e.g., dry mouth [not just dry lips], too weak to stand, dizziness, new weight loss) When did you last urinate?     ------------1/3 of Pediasure and 1 full bottle of water.  Urinated last: 10:15am   9. EXPOSURE: Have you traveled to a foreign country recently? Have you been exposed to anyone with diarrhea? Could you have eaten any food that was spoiled?     -------------------------------------- Denies    10. ANTIBIOTIC USE: Are you taking antibiotics now or have you taken antibiotics in the past 2 months?       ----------------------------------------------Denies new antibiotics.   11. OTHER SYMPTOMS: Do you have any other symptoms? (e.g., fever, blood in stool)       --- Body aches.  Protocols used: [facility]

## 2023-07-26 NOTE — ED Provider Notes (Signed)
 [name]    CSN: [id number] Arrival date & time: 07/26/23  1402      History   Chief Complaint Chief Complaint  Patient presents with   Fever    HPI Tyler Wu is a 78 y.o. male.   Patient presents today for evaluation of loose stools that started around 3 and this morning.  He states he has had 14-15 bowel movements since then.  He denies any blood in his stool or dark tarry stools.  He states he has had some bodyaches and has had loss of appetite but no vomiting.  He does note that he had fever this morning of 99.4 Fahrenheit.  He denies any congestion, cough, rash.  The history is provided by the patient.  Fever Associated symptoms: diarrhea and myalgias   Associated symptoms: no congestion, no cough, no ear pain, no nausea, no sore throat and no vomiting     Past Medical History:  Diagnosis Date   Allergy    Aortic atherosclerosis (HCC) 10/2019   per CT chest   BPH (benign prostatic hyperplasia)    Cataract 03/2019   Very early stages   CLL (chronic lymphocytic leukemia) (HCC) 2005   followed by oncology yearly.   History of COVID-19 2021   Hypertension 2024   Injury of nail bed of finger 11/25/2020    Patient Active Problem List   Diagnosis Date Noted   Hyperlipidemia 07/22/2023   Essential hypertension, benign 05/25/2022   Benign prostatic hyperplasia with urinary frequency 12/05/2021   Thrombocytopenia (HCC) 11/25/2020   Vitamin D  deficiency 08/26/2020   COVID-19 08/06/2020   Encounter for health maintenance examination in adult 05/02/2020   Medicare annual wellness visit, subsequent 05/02/2020   Screening for lipid disorders 05/02/2020   Vaccine counseling 05/02/2020   Advanced directives, counseling/discussion 05/02/2020   Enlarged prostate 03/08/2020   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   Urinary urgency 03/08/2020   History of COVID-19 11/13/2019   Aortic atherosclerosis (HCC) 11/03/2019   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 Estelita Manas   HERNIA REPAIR  1996       Home Medications    Prior to Admission medications   Medication Sig Start Date End Date Taking? Authorizing Provider  aspirin 81 MG chewable tablet Chew 81 mg by mouth.   Yes [provider]  cetirizine  (ZYRTEC ) 10 MG tablet TAKE 1 TABLET(10 MG) BY MOUTH AT BEDTIME 07/07/23  Yes Tysinger, Alm RAMAN, PA-C  cetirizine -pseudoephedrine (ZYRTEC -D) 5-120 MG tablet Take 1 tablet by mouth 2 (two) times daily. 10/26/19  Yes [provider]  doxycycline (VIBRAMYCIN) 100 MG capsule Take 100 mg by mouth 2 (two) times daily. 07/08/23  Yes [provider]  loratadine (CLARITIN) 10 MG tablet Take 10 mg by mouth daily. 01/08/18  Yes [provider]  montelukast (SINGULAIR) 10 MG tablet Take 10 mg by mouth at bedtime. 10/26/19  Yes [provider]  nirmatrelvir/ritonavir (PAXLOVID) 20 x 150 MG & 10 x 100MG  TBPK Take by mouth as directed. 08/06/20  Yes [provider]  nirmatrelvir/ritonavir (  PAXLOVID, 300/100,) 20 x 150 MG & 10 x 100MG  TBPK Take 1 tablet by mouth 2 (two) times daily. 08/06/20  Yes [provider]  rosuvastatin  (CRESTOR ) 10 MG tablet Take 1 tablet (10 mg total) by mouth daily. 05/25/23 05/24/24 Yes Tysinger, Alm RAMAN, PA-C  tamsulosin  (FLOMAX ) 0.4 MG CAPS capsule TAKE 1 CAPSULE(0.4 MG) BY MOUTH DAILY AFTER SUPPER 06/28/23  Yes Tysinger, Alm RAMAN, PA-C  Cyanocobalamin (VITAMIN B-12 PO) as directed Sublingual ocassionally    [provider]  fluticasone  (FLONASE ) 50 MCG/ACT nasal spray  PLACE 2 SPRAYS INTO BOTH NOSTRILS DAILY FOR 3 TO 5 DAYS, THEN DECREASE TO 1 SPRAY DAILY Patient not taking: Reported on 07/22/2023 12/28/22   Tysinger, Alm RAMAN, PA-C  Multiple Vitamins-Minerals (CENTRUM SILVER 50+MEN PO) Take 1 tablet by mouth daily.    [provider]  Vitamin D , Ergocalciferol , (DRISDOL ) 1.25 MG (50000 UNIT) CAPS capsule Take 1 capsule (50,000 Units total) by mouth every 7 (seven) days. 05/26/23   Tysinger, Alm RAMAN, PA-C    Family History Family History  Problem Relation Age of Onset   Cancer Father 66       melonoma   Heart attack Maternal Grandmother    Hypertension Maternal Grandmother    Stroke Maternal Grandmother    Heart attack Maternal Grandfather    Hypertension Maternal Grandfather    Kidney disease Brother     Social History Social History   Tobacco Use   Smoking status: Former    Current packs/day: 0.00    Types: Cigarettes    Quit date: 01/26/1974    Years since quitting: 49.5    Passive exposure: Past   Smokeless tobacco: Never   Tobacco comments:    Quit smoking 1976  Vaping Use   Vaping status: Never Used  Substance Use Topics   Alcohol use: No   Drug use: No     Allergies   Bacitracin and Levofloxacin   Review of Systems Review of Systems  Constitutional:  Positive for appetite change and fever.  HENT:  Negative for congestion, ear pain and sore throat.   Eyes:  Negative for discharge and redness.  Respiratory:  Negative for cough and shortness of breath.   Gastrointestinal:  Positive for diarrhea. Negative for abdominal pain, nausea and vomiting.  Musculoskeletal:  Positive for myalgias.     Physical Exam Triage Vital Signs ED Triage Vitals  Encounter Vitals Group     BP 07/26/23 1421 129/70     Girls Systolic BP Percentile --      Girls Diastolic BP Percentile --      Boys Systolic BP Percentile --      Boys Diastolic BP Percentile --      Pulse Rate 07/26/23 1421 88     Resp 07/26/23 1421 18     Temp 07/26/23  1421 99.3 F (37.4 C)     Temp Source 07/26/23 1421 Oral     SpO2 07/26/23 1421 96 %     Weight 07/26/23 1417 150 lb (68 kg)     Height 07/26/23 1417 5' 7.5 (1.715 m)     Head Circumference --      Peak Flow --      Pain Score 07/26/23 1413 0     Pain Loc --      Pain Education --      Exclude from Growth Chart --    No data found.  Updated Vital Signs BP 129/70 (BP Location: Left Arm)   Pulse  88   Temp 99.3 F (37.4 C) (Oral)   Resp 18   Ht 5' 7.5 (1.715 m)   Wt 150 lb (68 kg)   SpO2 96%   BMI 23.15 kg/m   Visual Acuity Right Eye Distance:   Left Eye Distance:   Bilateral Distance:    Right Eye Near:   Left Eye Near:    Bilateral Near:     Physical Exam Vitals and nursing note reviewed.  Constitutional:      General: He is not in acute distress.    Appearance: Normal appearance. He is not ill-appearing.  HENT:     Head: Normocephalic and atraumatic.     Nose: Nose normal. No congestion.     Mouth/Throat:     Mouth: Mucous membranes are moist.     Pharynx: Oropharynx is clear. No oropharyngeal exudate or posterior oropharyngeal erythema.   Eyes:     Conjunctiva/sclera: Conjunctivae normal.    Cardiovascular:     Rate and Rhythm: Normal rate and regular rhythm.     Heart sounds: Normal heart sounds. No murmur heard. Pulmonary:     Effort: Pulmonary effort is normal. No respiratory distress.     Breath sounds: Normal breath sounds. No wheezing, rhonchi or rales.  Abdominal:     General: Abdomen is flat. There is no distension.     Palpations: Abdomen is soft.     Tenderness: There is no abdominal tenderness. There is no guarding or rebound.   Skin:    General: Skin is warm and dry.   Neurological:     Mental Status: He is alert.   Psychiatric:        Mood and Affect: Mood normal.        Thought Content: Thought content normal.      UC Treatments / Results  Labs (all labs ordered are listed, but only abnormal results are displayed) Labs  Reviewed  POCT INFLUENZA A/B  POC SARS CORONAVIRUS 2 AG -  ED    EKG   Radiology No results found.  Procedures Procedures (including critical care time)  Medications Ordered in UC Medications - No data to display  Initial Impression / Assessment and Plan / UC Course  I have reviewed the triage vital signs and the nursing notes.  Pertinent labs & imaging results that were available during my care of the patient were reviewed by me and considered in my medical decision making (see chart for details).    Covid and flu screening negative. Exam reassuring. Suspect likely viral etiology of symptoms and recommended increase fluids with water and electrolyte enriched fluids such as Gatorade.  Recommended follow-up if no gradual improvement over the next 24 to 48 hours or sooner with any worsening.  Final Clinical Impressions(s) / UC Diagnoses   Final diagnoses:  Diarrhea, unspecified type  Fever, unspecified   Discharge Instructions   None    ED Prescriptions   None    PDMP not reviewed this encounter.   Billy Asberry FALCON, PA-C 07/26/23 [phone]

## 2023-07-26 NOTE — ED Triage Notes (Signed)
 Starting about 3am this morning (loose stools), 14-15x since (last stool 1300), some body aches, body stiffness, loss of appetite, and Fever 99.4 this morning.

## 2023-07-26 NOTE — Telephone Encounter (Signed)
 Copied from CRM [id number]. Topic: Clinical - Pink Word Triage >> Jul 26, 2023  9:35 AM Graeme ORN wrote: Reason for Triage: Diarrhea feeling achy all over - would like to be seen today.

## 2023-10-21 ENCOUNTER — Ambulatory Visit: Admission: EM | Admit: 2023-10-21 | Discharge: 2023-10-21 | Disposition: A | Discharge status: Home / Self Care

## 2023-10-21 ENCOUNTER — Other Ambulatory Visit: Payer: Self-pay

## 2023-10-21 DIAGNOSIS — H6123 Impacted cerumen, bilateral: Secondary | ICD-10-CM

## 2023-10-21 NOTE — ED Triage Notes (Signed)
 Pt reports right ear fullness since Sunday. States he tried debrox last night without relief. Decreased hearing right ear

## 2023-10-21 NOTE — ED Provider Notes (Signed)
 [name]    CSN: [id number] Arrival date & time: 10/21/23  1557      History   Chief Complaint Chief Complaint  Patient presents with   Ear Fullness    HPI Tyler Wu is a 78 y.o. male.   The patient presents with diminished hearing and a sensation of ear fullness bilaterally, more pronounced on the right, for the past several days. He denies ear pain, tinnitus, headache, dizziness, or recent upper respiratory infection. He does not use hearing aids but occasionally wears ear buds at work in certain environments. He attempted self-treatment with Debrox last night without improvement. He requests ear cleaning for relief of symptoms.  The following sections of the patient's history were reviewed and updated as appropriate: allergies, current medications, past family history, past medical history, past social history, past surgical history, and problem list.     Past Medical History:  Diagnosis Date   Allergy    Aortic atherosclerosis 10/2019   per CT chest   BPH (benign prostatic hyperplasia)    Cataract 03/2019   Very early stages   CLL (chronic lymphocytic leukemia) (HCC) 2005   followed by oncology yearly.   History of COVID-19 2021   Hypertension 2024   Injury of nail bed of finger 11/25/2020    Patient Active Problem List   Diagnosis Date Noted   Hyperlipidemia 07/22/2023   Essential hypertension, benign 05/25/2022   Benign prostatic hyperplasia with urinary frequency 12/05/2021   Thrombocytopenia 11/25/2020   Vitamin D  deficiency 08/26/2020   COVID-19 08/06/2020   Encounter for health maintenance examination in adult 05/02/2020   Medicare annual wellness visit, subsequent 05/02/2020   Screening for lipid disorders 05/02/2020   Vaccine counseling 05/02/2020   Advanced directives, counseling/discussion 05/02/2020   Enlarged prostate 03/08/2020   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   Urinary urgency 03/08/2020   History of COVID-19  11/13/2019   Aortic atherosclerosis 11/03/2019   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 Estelita Manas   HERNIA REPAIR  1996       Home Medications    Prior to Admission medications   Medication Sig Start Date End Date Taking? Authorizing Provider  aspirin 81 MG chewable tablet Chew 81 mg by mouth.   Yes [provider]  cetirizine  (ZYRTEC ) 10 MG tablet TAKE 1 TABLET(10 MG) BY MOUTH AT BEDTIME 07/07/23  Yes Tysinger, Alm RAMAN, PA-C  Multiple Vitamins-Minerals (CENTRUM SILVER 50+MEN PO) Take 1 tablet by mouth daily.   Yes [provider]  tamsulosin  (FLOMAX ) 0.4 MG CAPS capsule TAKE 1 CAPSULE(0.4 MG) BY MOUTH DAILY AFTER SUPPER 06/28/23  Yes Tysinger, Alm RAMAN, PA-C  valsartan  (DIOVAN ) 40 MG tablet Take 40 mg by mouth daily. 10/09/23  Yes [provider]  Vitamin D , Ergocalciferol , (DRISDOL ) 1.25 MG (50000 UNIT) CAPS capsule Take 1 capsule (50,000 Units total) by mouth every 7 (seven) days. 05/26/23  Yes Tysinger, Alm RAMAN, PA-C  cetirizine -pseudoephedrine (ZYRTEC -D) 5-120 MG tablet Take 1 tablet by mouth 2 (two) times daily. Patient not taking: Reported on 10/21/2023 10/26/19   [provider]  Cyanocobalamin (VITAMIN B-12 PO) as directed Sublingual ocassionally Patient not taking: Reported on 10/21/2023    [provider]  doxycycline (VIBRAMYCIN) 100 MG capsule Take 100 mg by mouth 2 (two) times daily. Patient not taking: Reported on 10/21/2023 07/08/23   [provider]  fluticasone  (FLONASE ) 50 MCG/ACT nasal spray PLACE 2 SPRAYS INTO BOTH  NOSTRILS DAILY FOR 3 TO 5 DAYS, THEN DECREASE TO 1 SPRAY DAILY Patient not taking: Reported on 07/22/2023 12/28/22   Tysinger, Alm RAMAN, PA-C  loratadine (CLARITIN) 10 MG tablet Take 10 mg by mouth daily. Patient not taking: Reported on 10/21/2023 01/08/18   [provider]  montelukast (SINGULAIR) 10 MG tablet Take 10 mg by mouth at bedtime. Patient not taking: Reported on 10/21/2023 10/26/19   [provider]  nirmatrelvir/ritonavir (PAXLOVID) 20 x 150 MG & 10 x 100MG  TBPK Take by mouth as directed. Patient not taking: Reported on 10/21/2023 08/06/20   [provider]  nirmatrelvir/ritonavir (PAXLOVID, 300/100,) 20 x 150 MG & 10 x 100MG  TBPK Take 1 tablet by mouth 2 (two) times daily. Patient not taking: Reported on 10/21/2023 08/06/20   [provider]  rosuvastatin  (CRESTOR ) 10 MG tablet Take 1 tablet (10 mg total) by mouth daily. Patient not taking: Reported on 10/21/2023 05/25/23 05/24/24  Tysinger, Alm RAMAN, PA-C    Family History Family History  Problem Relation Age of Onset   Cancer Father 39       melonoma   Heart attack Maternal Grandmother    Hypertension Maternal Grandmother    Stroke Maternal Grandmother    Heart attack Maternal Grandfather    Hypertension Maternal Grandfather    Kidney disease Brother     Social History Social History   Tobacco Use   Smoking status: Former    Current packs/day: 0.00    Types: Cigarettes    Quit date: 01/26/1974    Years since quitting: 49.7    Passive exposure: Past   Smokeless tobacco: Never   Tobacco comments:    Quit smoking 1976  Vaping Use   Vaping status: Never Used  Substance Use Topics   Alcohol use: No   Drug use: No     Allergies   Bacitracin and Levofloxacin   Review of Systems Review of Systems  HENT:  Positive for hearing loss. Negative for ear discharge, ear pain and tinnitus.   Neurological:  Negative for dizziness and headaches.  All other systems reviewed and are  negative.    Physical Exam Triage Vital Signs ED Triage Vitals [10/21/23 1607]  Encounter Vitals Group     BP (!) 157/72     Girls Systolic BP Percentile      Girls Diastolic BP Percentile      Boys Systolic BP Percentile      Boys Diastolic BP Percentile      Pulse Rate 74     Resp 18     Temp 98.1 F (36.7 C)     Temp Source Oral     SpO2 96 %     Weight      Height      Head Circumference      Peak Flow      Pain Score 0     Pain Loc      Pain Education      Exclude from Growth Chart    No data found.  Updated Vital Signs BP (!) 157/72 (BP Location: Left Arm)   Pulse 74  Temp 98.1 F (36.7 C) (Oral)   Resp 18   SpO2 96%   Visual Acuity Right Eye Distance:   Left Eye Distance:   Bilateral Distance:    Right Eye Near:   Left Eye Near:    Bilateral Near:     Physical Exam Vitals reviewed.  Constitutional:      General: He is awake. He is not in acute distress.    Appearance: Normal appearance. He is well-developed. He is not ill-appearing, toxic-appearing or diaphoretic.  HENT:     Head: Normocephalic.     Right Ear: Hearing and external ear normal. No tenderness. There is impacted cerumen.     Left Ear: Hearing and external ear normal. No tenderness. There is impacted cerumen.     Nose: Nose normal.     Mouth/Throat:     Mouth: Mucous membranes are moist.  Eyes:     General: Vision grossly intact.     Conjunctiva/sclera: Conjunctivae normal.  Cardiovascular:     Rate and Rhythm: Normal rate and regular rhythm.     Heart sounds: Normal heart sounds.  Pulmonary:     Effort: Pulmonary effort is normal.     Breath sounds: Normal breath sounds and air entry.  Musculoskeletal:        General: Normal range of motion.     Cervical back: Normal range of motion and neck supple.  Skin:    General: Skin is warm and dry.  Neurological:     General: No focal deficit present.     Mental Status: He is alert and oriented to person, place, and time.   Psychiatric:        Speech: Speech normal.        Behavior: Behavior is cooperative.      UC Treatments / Results  Labs (all labs ordered are listed, but only abnormal results are displayed) Labs Reviewed - No data to display  EKG   Radiology No results found.  Procedures Ear Cerumen Removal  Date/Time: 10/21/2023 5:22 PM  Performed by: Lorella Leonel CROME, RN Authorized by: Iola Lukes, FNP   Consent:    Consent obtained:  Verbal   Consent given by:  Patient   Risks, benefits, and alternatives were discussed: yes     Risks discussed:  Bleeding, infection, pain, dizziness, incomplete removal and TM perforation Universal protocol:    Patient identity confirmed:  Verbally with patient and arm band Procedure details:    Location:  R ear and L ear   Procedure type: irrigation     Procedure outcomes: cerumen removed   Post-procedure details:    Inspection:  Some cerumen remaining and TM intact   Hearing quality:  Improved   Procedure completion:  Tolerated well, no immediate complications Comments:     Post-procedure assessment was performed by the NP. A small amount of cerumen remains, but the patient reports subjective improvement with clearer hearing. He was advised to use Debrox at home to help loosen and remove any residual cerumen.  (including critical care time)  Medications Ordered in UC Medications - No data to display  Initial Impression / Assessment and Plan / UC Course  I have reviewed the triage vital signs and the nursing notes.  Pertinent labs & imaging results that were available during my care of the patient were reviewed by me and considered in my medical decision making (see chart for details).     The patient presented with decreased hearing and ear fullness due to  cerumen impaction. Ear irrigation was performed in clinic with good tolerance and noted improvement in symptoms. Post-procedure exam revealed partial clearance with no signs of  infection or trauma. The patient reports improved hearing and comfort. He was advised to continue at-home care with Debrox if needed and to monitor for recurrence of symptoms. Follow-up with primary care or return to clinic if symptoms persist, worsen, or if pain, drainage, or hearing loss develops.  Today's evaluation has revealed no signs of a dangerous process. Discussed diagnosis with patient and/or guardian. Patient and/or guardian aware of their diagnosis, possible red flag symptoms to watch out for and need for close follow up. Patient and/or guardian understands verbal and written discharge instructions. Patient and/or guardian comfortable with plan and disposition.  Patient and/or guardian has a clear mental status at this time, good insight into illness (after discussion and teaching) and has clear judgment to make decisions regarding their care  Documentation was completed with the aid of voice recognition software. Transcription may contain typographical errors.  Final Clinical Impressions(s) / UC Diagnoses   Final diagnoses:  Hearing loss of both ears due to cerumen impaction     Discharge Instructions      You were treated today for earwax buildup, also known as cerumen impaction. The wax was removed using gentle irrigation. Your ear may feel a little sensitive or itchy afterward, which is normal. Keep the ear dry for the rest of the day. Avoid putting anything into the ear, including Q-tips, as this can push wax deeper or irritate the ear canal. If you notice pain, drainage, hearing loss, or dizziness, please return for further evaluation. To help prevent future buildup, you may use over-the-counter ear drops such as Debrox EarWax Removal as directed     ED Prescriptions   None    PDMP not reviewed this encounter.   Iola Lukes, [place] 10/21/23 1725

## 2023-10-21 NOTE — Discharge Instructions (Signed)
 You were treated today for earwax buildup, also known as cerumen impaction. The wax was removed using gentle irrigation. Your ear may feel a little sensitive or itchy afterward, which is normal. Keep the ear dry for the rest of the day. Avoid putting anything into the ear, including Q-tips, as this can push wax deeper or irritate the ear canal. If you notice pain, drainage, hearing loss, or dizziness, please return for further evaluation. To help prevent future buildup, you may use over-the-counter ear drops such as Debrox EarWax Removal as directed

## 2023-12-03 ENCOUNTER — Encounter: Payer: Self-pay | Admitting: Hematology and Oncology

## 2023-12-03 ENCOUNTER — Inpatient Hospital Stay: Payer: TRICARE For Life (TFL)

## 2023-12-03 ENCOUNTER — Inpatient Hospital Stay: Payer: TRICARE For Life (TFL) | Attending: Hematology and Oncology | Admitting: Hematology and Oncology

## 2023-12-03 VITALS — BP 138/69 | HR 60 | Temp 98.1°F | Resp 17 | Wt 156.0 lb

## 2023-12-03 DIAGNOSIS — C9110 Chronic lymphocytic leukemia of B-cell type not having achieved remission: Secondary | ICD-10-CM | POA: Insufficient documentation

## 2023-12-03 LAB — CBC WITH DIFFERENTIAL/PLATELET
Abs Immature Granulocytes: 0.04 K/uL (ref 0.00–0.07)
Basophils Absolute: 0.1 K/uL (ref 0.0–0.1)
Basophils Relative: 0 %
Eosinophils Absolute: 0.2 K/uL (ref 0.0–0.5)
Eosinophils Relative: 1 %
HCT: 40.3 % (ref 39.0–52.0)
Hemoglobin: 13.1 g/dL (ref 13.0–17.0)
Immature Granulocytes: 0 %
Lymphocytes Relative: 76 %
Lymphs Abs: 16.6 K/uL — ABNORMAL HIGH (ref 0.7–4.0)
MCH: 28.7 pg (ref 26.0–34.0)
MCHC: 32.5 g/dL (ref 30.0–36.0)
MCV: 88.4 fL (ref 80.0–100.0)
Monocytes Absolute: 0.7 K/uL (ref 0.1–1.0)
Monocytes Relative: 3 %
Neutro Abs: 4.5 K/uL (ref 1.7–7.7)
Neutrophils Relative %: 20 %
Platelets: 154 K/uL (ref 150–400)
RBC: 4.56 MIL/uL (ref 4.22–5.81)
RDW: 13.7 % (ref 11.5–15.5)
Smear Review: NORMAL
WBC: 22.2 K/uL — ABNORMAL HIGH (ref 4.0–10.5)
nRBC: 0 % (ref 0.0–0.2)

## 2023-12-03 MED ORDER — ROSUVASTATIN CALCIUM 10 MG PO TABS: ORAL_TABLET | ORAL | Status: DC

## 2023-12-03 NOTE — Assessment & Plan Note (Addendum)
 The patient was diagnosed with CLL since 2005, Rai stage I with lymphadenopathy He has been on observation for over 20 years There is no appreciable progression of CLL I recommend close monitoring for now I will see him again in a year for further follow-up I reviewed his vaccination schedule and he appears to be up-to-date with all his vaccination

## 2023-12-03 NOTE — Progress Notes (Signed)
 [facility] OFFICE PROGRESS NOTE  Patient Care Team: Tysinger, Alm RAMAN, PA-C as PCP - General (Family Medicine) Lonn Hicks, MD as Consulting Physician (Hematology and Oncology)  Assessment & Plan CLL (chronic lymphocytic leukemia) [facility]) The patient was diagnosed with CLL since 2005, Rai stage I with lymphadenopathy He has been on observation for over 20 years There is no appreciable progression of CLL I recommend close monitoring for now I will see him again in a year for further follow-up I reviewed his vaccination schedule and he appears to be up-to-date with all his vaccination  No orders of the defined types were placed in this encounter.    Hicks Lonn, MD  INTERVAL HISTORY: he returns for surveillance follow-up for history of CLL He is doing well No new lymphadenopathy We discussed test results  PHYSICAL EXAMINATION: ECOG PERFORMANCE STATUS: 0 - Asymptomatic  Vitals:   12/03/23 1003  BP: 138/69  Pulse: 60  Resp: 17  Temp: 98.1 F (36.7 C)  SpO2: 97%   Filed Weights   12/03/23 1003  Weight: 156 lb (70.8 kg)    Relevant data reviewed during this visit included CBC with differential

## 2023-12-27 ENCOUNTER — Other Ambulatory Visit: Payer: Self-pay | Admitting: Medical

## 2023-12-30 ENCOUNTER — Inpatient Hospital Stay: Admit: 2023-12-30 | Discharge: 2023-12-30

## 2023-12-30 VITALS — BP 137/66 | HR 74 | Temp 98.1°F | Resp 18 | Wt 156.1 lb

## 2023-12-30 DIAGNOSIS — J019 Acute sinusitis, unspecified: Secondary | ICD-10-CM | POA: Diagnosis not present

## 2023-12-30 MED ORDER — AMOXICILLIN-POT CLAVULANATE 875-125 MG PO TABS: 1.0000 | ORAL_TABLET | Freq: Two times a day (BID) | ORAL | 0 refills | Status: AC

## 2023-12-30 NOTE — Discharge Instructions (Addendum)

## 2023-12-30 NOTE — ED Provider Notes (Signed)
 [name]    CSN: [id number] Arrival date & time: 12/30/23  1552      History   Chief Complaint No chief complaint on file.   HPI Tyler Wu is a 78 y.o. male.   Pt presents today due to 3 weeks of purulent nasal drainage despite use of OTC meds and home remedies. Pt denies sinus pressure, sinus pain, headache, or experiencing fatigue.   The history is provided by the patient.    Past Medical History:  Diagnosis Date   Allergy    Aortic atherosclerosis 10/2019   per CT chest   BPH (benign prostatic hyperplasia)    Cataract 03/2019   Very early stages   CLL (chronic lymphocytic leukemia) (HCC) 2005   followed by oncology yearly.   History of COVID-19 2021   Hypertension 2024   Injury of nail bed of finger 11/25/2020    Patient Active Problem List   Diagnosis Date Noted   Hyperlipidemia 07/22/2023   Essential hypertension, benign 05/25/2022   Benign prostatic hyperplasia with urinary frequency 12/05/2021   Thrombocytopenia 11/25/2020   Vitamin D  deficiency 08/26/2020   COVID-19 08/06/2020   Encounter for health maintenance examination in adult 05/02/2020   Medicare annual wellness visit, subsequent 05/02/2020   Screening for lipid disorders 05/02/2020   Vaccine counseling 05/02/2020   Advanced directives, counseling/discussion 05/02/2020   Enlarged prostate 03/08/2020   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 Estelita Manas   HERNIA REPAIR  1996       Home Medications    Prior to Admission medications   Medication Sig Start Date End Date Taking? Authorizing Provider  amoxicillin -clavulanate (AUGMENTIN ) 875-125 MG tablet Take 1 tablet by mouth every 12 (twelve) hours for 10 days. 12/30/23 01/09/24 Yes Andra Corean BROCKS, PA-C  FLUAD 0.5 ML injection  10/29/23  Yes [provider]  OVER THE COUNTER MEDICATION Coricidin   Yes [provider]  aspirin 81 MG chewable tablet Chew 81 mg by mouth.    [provider]  cetirizine  (ZYRTEC ) 10 MG tablet TAKE 1 TABLET(10 MG) BY MOUTH AT BEDTIME 07/07/23   Tysinger, Alm RAMAN, PA-C  fluticasone  (FLONASE ) 50 MCG/ACT nasal spray PLACE 2 SPRAYS INTO BOTH NOSTRILS DAILY FOR 3 TO 5 DAYS, THEN DECREASE TO 1 SPRAY DAILY Patient not taking: Reported on 07/22/2023 12/28/22   Tysinger, Alm RAMAN, PA-C  Multiple Vitamins-Minerals (CENTRUM SILVER 50+MEN PO) Take 1 tablet by mouth daily.    [provider]  rosuvastatin  (CRESTOR ) 10 MG tablet 1 tab every other day 12/03/23   Lonn Hicks, MD  tamsulosin  (FLOMAX ) 0.4 MG CAPS capsule TAKE 1 CAPSULE(0.4 MG) BY MOUTH DAILY AFTER SUPPER 12/27/23   Tysinger, Alm RAMAN, PA-C  valsartan  (DIOVAN ) 40 MG tablet Take 40 mg by mouth daily. 10/09/23   [provider]  Vitamin D , Ergocalciferol , (DRISDOL ) 1.25 MG (50000 UNIT) CAPS capsule Take 1 capsule (50,000 Units total) by mouth every  7 (seven) days. 05/26/23   Tysinger, Alm RAMAN, PA-C    Family History Family History  Problem Relation Age of Onset   Cancer Father 36       melonoma   Heart attack Maternal Grandmother    Hypertension Maternal Grandmother    Stroke Maternal Grandmother    Heart attack Maternal Grandfather    Hypertension Maternal Grandfather    Kidney disease Brother     Social History Social History   Tobacco Use   Smoking status: Former    Current packs/day: 0.00    Types: Cigarettes    Quit date: 01/26/1974     Years since quitting: 49.9    Passive exposure: Past   Smokeless tobacco: Never   Tobacco comments:    Quit smoking 1976  Vaping Use   Vaping status: Never Used  Substance Use Topics   Alcohol use: No   Drug use: No     Allergies   Bacitracin and Levofloxacin   Review of Systems Review of Systems   Physical Exam Triage Vital Signs ED Triage Vitals  Encounter Vitals Group     BP 12/30/23 1604 137/66     Girls Systolic BP Percentile --      Girls Diastolic BP Percentile --      Boys Systolic BP Percentile --      Boys Diastolic BP Percentile --      Pulse Rate 12/30/23 1604 74     Resp 12/30/23 1604 18     Temp 12/30/23 1604 98.1 F (36.7 C)     Temp Source 12/30/23 1604 Oral     SpO2 12/30/23 1604 97 %     Weight 12/30/23 1602 156 lb 1.4 oz (70.8 kg)     Height --      Head Circumference --      Peak Flow --      Pain Score 12/30/23 1602 0     Pain Loc --      Pain Education --      Exclude from Growth Chart --    No data found.  Updated Vital Signs BP 137/66 (BP Location: Left Arm)   Pulse 74   Temp 98.1 F (36.7 C) (Oral)   Resp 18   Wt 156 lb 1.4 oz (70.8 kg)   SpO2 97%   BMI 24.09 kg/m   Visual Acuity Right Eye Distance:   Left Eye Distance:   Bilateral Distance:    Right Eye Near:   Left Eye Near:    Bilateral Near:     Physical Exam Vitals and nursing note reviewed.  Constitutional:      General: He is not in acute distress.    Appearance: Normal appearance. He is not ill-appearing, toxic-appearing or diaphoretic.  HENT:     Nose:     Right Sinus: No maxillary sinus tenderness or frontal sinus tenderness.     Left Sinus: No maxillary sinus tenderness or frontal sinus tenderness.     Comments: No tenderness to palpation of upper lip Eyes:     General: No scleral icterus. Cardiovascular:     Rate and Rhythm: Normal rate and regular rhythm.     Heart sounds: Normal heart sounds.  Pulmonary:     Effort: Pulmonary effort is normal. No  respiratory distress.     Breath sounds: Normal breath sounds. No wheezing or rhonchi.  Skin:    General: Skin is warm.  Neurological:     Mental Status: He is alert  and oriented to person, place, and time.  Psychiatric:        Mood and Affect: Mood normal.        Behavior: Behavior normal.      UC Treatments / Results  Labs (all labs ordered are listed, but only abnormal results are displayed) Labs Reviewed - No data to display  EKG   Radiology No results found.  Procedures Procedures (including critical care time)  Medications Ordered in UC Medications - No data to display  Initial Impression / Assessment and Plan / UC Course  I have reviewed the triage vital signs and the nursing notes.  Pertinent labs & imaging results that were available during my care of the patient were reviewed by me and considered in my medical decision making (see chart for details).    Final Clinical Impressions(s) / UC Diagnoses   Final diagnoses:  Acute sinusitis, recurrence not specified, unspecified location     Discharge Instructions      You have been diagnosed with a sinus infection today, some are caused by viruses and others are caused by bacteria.  If your symptoms have been going on for less than 7 days it is most likely that you have a viral sinus infection.  Antibiotics will not work for this and it will have to run its course.  Sinus rinses (using a Nettie pot) or saline rinses are helpful as well as pseudoephedrine, and nasal sprays along with ibuprofen and Tylenol for pain.  If you have had your symptoms for more than 7 days you most likely have a bacterial infection and will be prescribed antibiotics.  Supportive measures given for viral sinus infections will also be helpful for bacterial infections.  If you are using antibiotics you should start to feel better in 2 to 3 days but it is important that you complete antibiotics in their entirety.     ED Prescriptions      Medication Sig Dispense Auth. Provider   amoxicillin -clavulanate (AUGMENTIN ) 875-125 MG tablet Take 1 tablet by mouth every 12 (twelve) hours for 10 days. 20 tablet Andra Corean BROCKS, PA-C      PDMP not reviewed this encounter.   Andra Corean BROCKS, PA-C 12/30/23 1642

## 2023-12-30 NOTE — ED Triage Notes (Signed)
 Pt presents c/o sinus concern and nasal congestion x 21 days. Pt states,  For the last 3 weeks I have had congestion that gradually got worse. It's not like a normal sinus infection. The glands in my neck are swollen especially on my left side. When I blow my nose it comes out thick and green. I use nasal spray and Coricidin  several times a day  Pt denies emesis and diarrhea.

## 2024-01-05 ENCOUNTER — Other Ambulatory Visit: Payer: Self-pay | Admitting: Medical

## 2024-02-19 ENCOUNTER — Other Ambulatory Visit: Payer: Self-pay | Admitting: Medical

## 2024-05-31 ENCOUNTER — Ambulatory Visit: Payer: Self-pay | Admitting: Medical

## 2024-12-01 ENCOUNTER — Inpatient Hospital Stay

## 2024-12-01 ENCOUNTER — Inpatient Hospital Stay: Admitting: Hematology and Oncology
